# Patient Record
Sex: Female | Born: 1985 | Race: White | Hispanic: No | Smoking: Former smoker
Health system: Southern US, Community
[De-identification: ages and names within clinical notes are randomized; demographics above are authoritative.]

## PROBLEM LIST (undated history)

## (undated) DIAGNOSIS — Z8632 Personal history of gestational diabetes: Secondary | ICD-10-CM

## (undated) DIAGNOSIS — E119 Type 2 diabetes mellitus without complications: Secondary | ICD-10-CM

## (undated) DIAGNOSIS — F32A Depression, unspecified: Secondary | ICD-10-CM

## (undated) DIAGNOSIS — Z973 Presence of spectacles and contact lenses: Secondary | ICD-10-CM

## (undated) DIAGNOSIS — F419 Anxiety disorder, unspecified: Secondary | ICD-10-CM

## (undated) DIAGNOSIS — R208 Other disturbances of skin sensation: Secondary | ICD-10-CM

## (undated) DIAGNOSIS — O24419 Gestational diabetes mellitus in pregnancy, unspecified control: Secondary | ICD-10-CM

## (undated) DIAGNOSIS — Z8759 Personal history of other complications of pregnancy, childbirth and the puerperium: Secondary | ICD-10-CM

## (undated) DIAGNOSIS — G43909 Migraine, unspecified, not intractable, without status migrainosus: Secondary | ICD-10-CM

## (undated) DIAGNOSIS — M549 Dorsalgia, unspecified: Secondary | ICD-10-CM

## (undated) HISTORY — DX: Migraine, unspecified, not intractable, without status migrainosus: G43.909

## (undated) HISTORY — DX: Other disturbances of skin sensation: R20.8

## (undated) HISTORY — DX: Dorsalgia, unspecified: M54.9

## (undated) HISTORY — DX: Anxiety disorder, unspecified: F41.9

## (undated) HISTORY — DX: Gestational diabetes mellitus in pregnancy, unspecified control: O24.419

## (undated) HISTORY — PX: TOOTH EXTRACTION: SUR596

## (undated) HISTORY — DX: Type 2 diabetes mellitus without complications: E11.9

---

## 2020-07-14 ENCOUNTER — Emergency Department (HOSPITAL_COMMUNITY)
Admission: EM | Admit: 2020-07-14 | Discharge: 2020-07-14 | Disposition: A | Discharge status: Home / Self Care | Payer: BC Managed Care – PPO | Attending: Emergency Medicine | Admitting: Emergency Medicine

## 2020-07-14 ENCOUNTER — Encounter (HOSPITAL_COMMUNITY): Payer: Self-pay | Admitting: Obstetrics and Gynecology

## 2020-07-14 ENCOUNTER — Other Ambulatory Visit: Payer: Self-pay

## 2020-07-14 ENCOUNTER — Emergency Department (HOSPITAL_COMMUNITY): Payer: BC Managed Care – PPO

## 2020-07-14 DIAGNOSIS — O418X10 Other specified disorders of amniotic fluid and membranes, first trimester, not applicable or unspecified: Secondary | ICD-10-CM

## 2020-07-14 DIAGNOSIS — O26891 Other specified pregnancy related conditions, first trimester: Secondary | ICD-10-CM | POA: Insufficient documentation

## 2020-07-14 DIAGNOSIS — Z3A01 Less than 8 weeks gestation of pregnancy: Secondary | ICD-10-CM | POA: Diagnosis not present

## 2020-07-14 DIAGNOSIS — N939 Abnormal uterine and vaginal bleeding, unspecified: Secondary | ICD-10-CM | POA: Diagnosis not present

## 2020-07-14 DIAGNOSIS — O4690 Antepartum hemorrhage, unspecified, unspecified trimester: Secondary | ICD-10-CM

## 2020-07-14 LAB — CBC WITH DIFFERENTIAL/PLATELET
Abs Immature Granulocytes: 0.03 10*3/uL (ref 0.00–0.07)
Basophils Absolute: 0 10*3/uL (ref 0.0–0.1)
Basophils Relative: 1 %
Eosinophils Absolute: 0.1 10*3/uL (ref 0.0–0.5)
Eosinophils Relative: 1 %
HCT: 40.6 % (ref 36.0–46.0)
Hemoglobin: 13.3 g/dL (ref 12.0–15.0)
Immature Granulocytes: 0 %
Lymphocytes Relative: 26 %
Lymphs Abs: 2.3 10*3/uL (ref 0.7–4.0)
MCH: 29.3 pg (ref 26.0–34.0)
MCHC: 32.8 g/dL (ref 30.0–36.0)
MCV: 89.4 fL (ref 80.0–100.0)
Monocytes Absolute: 0.6 10*3/uL (ref 0.1–1.0)
Monocytes Relative: 6 %
Neutro Abs: 5.8 10*3/uL (ref 1.7–7.7)
Neutrophils Relative %: 66 %
Platelets: 338 10*3/uL (ref 150–400)
RBC: 4.54 MIL/uL (ref 3.87–5.11)
RDW: 14.2 % (ref 11.5–15.5)
WBC: 8.9 10*3/uL (ref 4.0–10.5)
nRBC: 0 % (ref 0.0–0.2)

## 2020-07-14 LAB — BASIC METABOLIC PANEL
Anion gap: 8 (ref 5–15)
BUN: 8 mg/dL (ref 6–20)
CO2: 24 mmol/L (ref 22–32)
Calcium: 8.8 mg/dL — ABNORMAL LOW (ref 8.9–10.3)
Chloride: 105 mmol/L (ref 98–111)
Creatinine, Ser: 0.65 mg/dL (ref 0.44–1.00)
GFR, Estimated: >60 mL/min (ref >60)
Glucose, Bld: 123 mg/dL — ABNORMAL HIGH (ref 70–99)
Potassium: 3.1 mmol/L — ABNORMAL LOW (ref 3.5–5.1)
Sodium: 137 mmol/L (ref 135–145)

## 2020-07-14 LAB — HCG, QUANTITATIVE, PREGNANCY: hCG, Beta Chain, Quant, S: 50367 m[IU]/mL — ABNORMAL HIGH (ref <5)

## 2020-07-14 LAB — ABO/RH: ABO/RH(D): O POS

## 2020-07-14 MED ORDER — POTASSIUM CHLORIDE CRYS ER 20 MEQ PO TBCR
20.0000 meq | EXTENDED_RELEASE_TABLET | Freq: Once | ORAL | Status: AC
  Administered 2020-07-14: 20 meq via ORAL
  Filled 2020-07-14: qty 1

## 2020-07-14 MED ORDER — PRENATAL COMPLETE 14-0.4 MG PO TABS: ORAL_TABLET | ORAL | 2 refills | Status: DC

## 2020-07-14 NOTE — ED Triage Notes (Addendum)
Patient reports she took 2 home pregnancy tests over the last week that came back positive. Last period week of thanksgiving. Patient reports she is not on a prenatal. Patient reports she woke up this morning to vaginal bleeding.

## 2020-07-14 NOTE — ED Provider Notes (Signed)
[facility]-EMERGENCY DEPT Provider Note   CSN: [id number] Arrival date & time: 07/14/20  1102     History No chief complaint on file.   Katherine Maldonado is a 35 y.o. female.  The history is provided by the patient. No language interpreter was used.  Vaginal Bleeding Quality:  Spotting Severity:  Mild Onset quality:  Gradual Duration:  1 day Timing:  Constant Chronicity:  New Possible pregnancy: yes   Relieved by:  Nothing Worsened by:  Nothing Associated symptoms: no abdominal pain and no nausea   Pt reports 2 home pregnancy test that were positive.  Pt reports bleeding today      History reviewed. No pertinent past medical history.  There are no problems to display for this patient.   History reviewed. No pertinent surgical history.   OB History    Gravida  1   Para      Term      Preterm      AB      Living        SAB      IAB      Ectopic      Multiple      Live Births              No family history on file.  Social History   Tobacco Use  . Smoking status: Never Smoker  . Smokeless tobacco: Never Used  Substance Use Topics  . Alcohol use: Not Currently  . Drug use: Not Currently    Home Medications Prior to Admission medications   Not on File    Allergies    Patient has no allergy information on record.  Review of Systems   Review of Systems  Gastrointestinal: Negative for abdominal pain and nausea.  Genitourinary: Positive for vaginal bleeding.  All other systems reviewed and are negative.   Physical Exam Updated Vital Signs BP 128/77 (BP Location: Right Arm)   Pulse 82   Temp 97.8 F (36.6 C) (Oral)   Resp 17   LMP 06/04/2020 (Approximate)   SpO2 100%   Physical Exam Vitals and nursing note reviewed.  Constitutional:      Appearance: She is well-developed and well-nourished.  HENT:     Head: Normocephalic.  Eyes:     Extraocular Movements: EOM normal.  Cardiovascular:     Rate  and Rhythm: Normal rate.  Pulmonary:     Effort: Pulmonary effort is normal.  Abdominal:     General: Abdomen is flat. There is no distension.  Musculoskeletal:        General: Normal range of motion.     Cervical back: Normal range of motion.  Skin:    General: Skin is warm.  Neurological:     General: No focal deficit present.     Mental Status: She is alert and oriented to person, place, and time.  Psychiatric:        Mood and Affect: Mood and affect and mood normal.     ED Results / Procedures / Treatments   Labs (all labs ordered are listed, but only abnormal results are displayed) Labs Reviewed  HCG, QUANTITATIVE, PREGNANCY - Abnormal; Notable for the following components:      Result Value   hCG, Beta Chain, Quant, S 50,367 (*)    All other components within normal limits  BASIC METABOLIC PANEL - Abnormal; Notable for the following components:   Potassium 3.1 (*)    Glucose, Bld 123 (*)  Calcium 8.8 (*)    All other components within normal limits  CBC WITH DIFFERENTIAL/PLATELET  ABO/RH    EKG None  Radiology No results found.  Procedures Procedures (including critical care time)  Medications Ordered in ED Medications - No data to display  ED Course  I have reviewed the triage vital signs and the nursing notes.  Pertinent labs & imaging results that were available during my care of the patient were reviewed by me and considered in my medical decision making (see chart for details). Clinical Course as of 07/14/20 1748  Sat Jul 14, 2020  1735 US OB Comp Less 14 Wks [LS]    Clinical Course User Index [LS] Osie Cheeks   MDM Rules/Calculators/A&P                          MDM: 0 positive, HCG [phone] , Ultrasound ordered. Pt's care turned over to [facility] ultrasound pending Final Clinical Impression(s) / ED Diagnoses Final diagnoses:  Vaginal bleeding during pregnancy    Rx / DC Orders ED Discharge Orders    None        Osie Cheeks 07/14/20 1752    Milagros Loll, MD 07/16/20 2328

## 2020-07-14 NOTE — L&D Delivery Note (Signed)
Delivery Note After about an hour of pushing, at 11:02 PM a viable female was delivered via Vaginal, Spontaneous (Presentation:   Occiput Anterior).  APGAR: 9, 10; weight 7 lb 8.6 oz (3419 g).  After 1 minute, the cord was clamped and cut. 40 units of pitocin diluted in 1000cc LR was infused rapidly IV.  The placenta separated spontaneously and delivered via CCT and maternal pushing effort.  It was inspected and appears to be intact with a 3 VC.  She had continued oozing of blood from her uterus, so cytotec given PR and TXA 1gm IV.  Bleeding resolved. She had several severe range BPs during the first pp hour, so Dr. Charlotta Newton consulted and Mg started.  Anesthesia: Epidural Episiotomy: None Lacerations: None Suture Repair:  Est. Blood Loss (mL): 600  Mom to postpartum.  Baby to Couplet care / Skin to Skin.  Katherine Maldonado 02/15/2021, 12:43 AM

## 2020-07-14 NOTE — ED Provider Notes (Signed)
Care transferred from [place], [place] at shift change.  Her for vaginal bleeding and home positive preg test.  Preg here positive Opos- doe snot need Rhogam Plan on follow up on imaging and determine disposition Physical Exam  BP 129/70   Pulse 77   Temp 97.8 F (36.6 C) (Oral)   Resp 16   LMP 06/04/2020 (Approximate)   SpO2 98%   Physical Exam Vitals and nursing note reviewed.  Constitutional:      General: She is not in acute distress.    Appearance: She is well-developed and well-nourished. She is not diaphoretic.  HENT:     Head: Atraumatic.  Eyes:     Pupils: Pupils are equal, round, and reactive to light.  Cardiovascular:     Rate and Rhythm: Normal rate and regular rhythm.  Pulmonary:     Effort: Pulmonary effort is normal. No respiratory distress.  Abdominal:     General: There is no distension.     Palpations: Abdomen is soft.  Musculoskeletal:        General: Normal range of motion.     Cervical back: Normal range of motion and neck supple.  Skin:    General: Skin is warm and dry.  Neurological:     Mental Status: She is alert.  Psychiatric:        Mood and Affect: Mood and affect normal.     ED Course/Procedures   Clinical Course as of 07/14/20 1912  Sat Jul 14, 2020  1735 US OB Comp Less 14 Wks [LS]    Clinical Course User Index [LS] Elson [place], [place]    Procedures   Labs Reviewed  HCG, QUANTITATIVE, PREGNANCY - Abnormal; Notable for the following components:      Result Value   hCG, Beta Chain, Quant, S 50,367 (*)    All other components within normal limits  BASIC METABOLIC PANEL - Abnormal; Notable for the following components:   Potassium 3.1 (*)    Glucose, Bld 123 (*)    Calcium 8.8 (*)    All other components within normal limits  CBC WITH DIFFERENTIAL/PLATELET  ABO/RH  US OB LESS THAN 14 WEEKS WITH OB TRANSVAGINAL  Result Date: 07/14/2020 CLINICAL DATA:  Vaginal spotting pregnant patient EXAM: OBSTETRIC <14 WK [place] AND  TRANSVAGINAL OB US TECHNIQUE: Both transabdominal and transvaginal ultrasound examinations were performed for complete evaluation of the gestation as well as the maternal uterus, adnexal regions, and pelvic cul-de-sac. Transvaginal technique was performed to assess early pregnancy. COMPARISON:  None. FINDINGS: Intrauterine gestational sac: Single intrauterine gestation Yolk sac:  Visualized. Embryo:  Visualized. Cardiac Activity: Visualized. Heart Rate: 120 bpm CRL: 6 mm   6 w   3 d                  [place] EDC: 03/06/2021 Subchorionic hemorrhage: Small subchorionic hemorrhage along the right aspect of the sac. Maternal uterus/adnexae: Small hypoechoic uterine masses, possible fibroids, this measures 7 mm within the anterior uterine corpus and 1 cm within the subserosal posterior lower uterine segment. Ovaries are within normal limits. Right ovary measures 3.8 x 1.8 x 2.1 cm and contains probable corpus luteum. The left ovary measures 2.3 x 0.7 x 1.7 cm. No significant free fluid. IMPRESSION: 1. Single viable intrauterine pregnancy as above. 2. Small subchorionic hemorrhage 3. Probable small uterine fibroids Electronically Signed   By: Jasmine Pang M.D.   On: 07/14/2020 18:38   MDM  Preg, vag bleeding>> Plan to follow up on  [place]  Ultrasound with small subchorionic hemorrhage.  Single IUP at 6 weeks 3 days.  Discussed results with patient.  Pelvic rest.  Strict return precautions.  Prescription written for prenatal vitamins.  She will follow-up with OB or return for new worsening symptoms per  The patient has been appropriately medically screened and/or stabilized in the ED. I have low suspicion for any other emergent medical condition which would require further screening, evaluation or treatment in the ED or require inpatient management.  Patient is hemodynamically stable and in no acute distress.  Patient able to ambulate in department prior to ED.  Evaluation does not show acute pathology that would require  ongoing or additional emergent interventions while in the emergency department or further inpatient treatment.  I have discussed the diagnosis with the patient and answered all questions.  Pain is been managed while in the emergency department and patient has no further complaints prior to discharge.  Patient is comfortable with plan discussed in room and is stable for discharge at this time.  I have discussed strict return precautions for returning to the emergency department.  Patient was encouraged to follow-up with PCP/specialist refer to at discharge.     Linwood Dibbles, PA-C 07/14/20 1912    Milagros Loll, MD 07/16/20 2326

## 2020-07-14 NOTE — ED Notes (Signed)
Patient transported to Ultrasound 

## 2020-07-14 NOTE — Discharge Instructions (Addendum)
Follow up with Obgyn  If you start bleeding through a pad having to change in less than 2 hours please seek reevaluation.  There is a women's center at the [facility] emergency department, this is not located at the main emergency department entrance however is located at the women and children's entrance.  If you have any new or worsening symptoms I suggest following up there for evaluation

## 2020-07-22 ENCOUNTER — Emergency Department (HOSPITAL_COMMUNITY)
Admission: EM | Admit: 2020-07-22 | Discharge: 2020-07-22 | Disposition: A | Discharge status: Home / Self Care | Payer: BC Managed Care – PPO | Attending: Emergency Medicine | Admitting: Emergency Medicine

## 2020-07-22 ENCOUNTER — Other Ambulatory Visit: Payer: Self-pay

## 2020-07-22 ENCOUNTER — Encounter (HOSPITAL_COMMUNITY): Payer: Self-pay

## 2020-07-22 DIAGNOSIS — O26851 Spotting complicating pregnancy, first trimester: Secondary | ICD-10-CM | POA: Insufficient documentation

## 2020-07-22 DIAGNOSIS — Z5321 Procedure and treatment not carried out due to patient leaving prior to being seen by health care provider: Secondary | ICD-10-CM | POA: Diagnosis not present

## 2020-07-22 DIAGNOSIS — Z3A01 Less than 8 weeks gestation of pregnancy: Secondary | ICD-10-CM | POA: Diagnosis not present

## 2020-07-22 NOTE — ED Notes (Signed)
Pt called 3x for room placement. Eloped from waiting area.  

## 2020-07-22 NOTE — ED Triage Notes (Signed)
Patient was seen on 07/15/19 for vaginal bleeding and pregnancy. Patient states she has not followed up with Ob/GYN because of the cost.

## 2020-07-25 ENCOUNTER — Inpatient Hospital Stay (HOSPITAL_COMMUNITY)
Admission: EM | Admit: 2020-07-25 | Discharge: 2020-07-25 | Disposition: A | Discharge status: Home / Self Care | Payer: Medicaid Other | Attending: Obstetrics and Gynecology | Admitting: Obstetrics and Gynecology

## 2020-07-25 ENCOUNTER — Encounter (HOSPITAL_COMMUNITY): Payer: Self-pay

## 2020-07-25 ENCOUNTER — Other Ambulatory Visit: Payer: Self-pay

## 2020-07-25 ENCOUNTER — Inpatient Hospital Stay (HOSPITAL_COMMUNITY): Payer: Medicaid Other

## 2020-07-25 DIAGNOSIS — Z3A01 Less than 8 weeks gestation of pregnancy: Secondary | ICD-10-CM | POA: Diagnosis not present

## 2020-07-25 DIAGNOSIS — O4691 Antepartum hemorrhage, unspecified, first trimester: Secondary | ICD-10-CM

## 2020-07-25 DIAGNOSIS — Z3A08 8 weeks gestation of pregnancy: Secondary | ICD-10-CM | POA: Insufficient documentation

## 2020-07-25 DIAGNOSIS — Z91040 Latex allergy status: Secondary | ICD-10-CM | POA: Diagnosis not present

## 2020-07-25 DIAGNOSIS — O208 Other hemorrhage in early pregnancy: Secondary | ICD-10-CM | POA: Diagnosis not present

## 2020-07-25 DIAGNOSIS — O209 Hemorrhage in early pregnancy, unspecified: Secondary | ICD-10-CM | POA: Diagnosis present

## 2020-07-25 LAB — URINALYSIS, ROUTINE W REFLEX MICROSCOPIC
Bilirubin Urine: NEGATIVE
Glucose, UA: NEGATIVE mg/dL
Ketones, ur: NEGATIVE mg/dL
Leukocytes,Ua: NEGATIVE
Nitrite: NEGATIVE
Protein, ur: NEGATIVE mg/dL
Specific Gravity, Urine: 1.01 (ref 1.005–1.030)
pH: 7 (ref 5.0–8.0)

## 2020-07-25 LAB — WET PREP, GENITAL
Sperm: NONE SEEN
Trich, Wet Prep: NONE SEEN
Yeast Wet Prep HPF POC: NONE SEEN

## 2020-07-25 NOTE — MAU Note (Signed)
.  Katherine Maldonado is a 35 y.o. at [redacted]w[redacted]d here in MAU reporting: Vaginal bleeding since 0030 this morning. Patient states she could not find her sanitary napkins so has not worn one. Denies abdominal pain.   Last intercourse: few days after christmas Pain score: 0/10 Vitals:   07/25/20 0129  BP: 126/74  Pulse: 83  Resp: 16  Temp: 98.4 F (36.9 C)  SpO2: 100%   Lab orders placed from triage: UA

## 2020-07-25 NOTE — Discharge Instructions (Signed)
Vaginal Bleeding During Pregnancy, First Trimester A small amount of bleeding from the vagina is common during early pregnancy. This kind of bleeding is also called spotting. Sometimes the bleeding is normal and does not cause problems. At other times, though, bleeding may be a sign of something serious. Normal bleeding in pregnancy can happen:  When the fertilized egg attaches itself to your womb.  When blood vessels change because of the pregnancy.  When you have pelvic exams.  When you have sex. Abnormal bleeding can happen:  When you have an infection.  When you have growths in your womb. The growths are called polyps.  If you are having a miscarriage or at risk of having one.  If you have other problems in your pregnancy. Tell your doctor right away about any bleeding from your vagina. Follow these instructions at home: Watch your bleeding  Watch your condition for any changes. Let your doctor know if you are worried about something.  Try to know what causes your bleeding. Ask yourself these questions: ? Does the bleeding start on its own? ? Does the bleeding start after something is done, such as sex or a pelvic exam?  Use a diary to write the things you see about your bleeding. Write in your diary: ? If the bleeding flows freely without stopping, or if it starts and stops, and then starts again. ? If the bleeding is heavy or light. ? How many pads you use in a day and how much blood is in them.  Tell your doctor if you pass tissue. He or she may want to see it.   Activity  Follow your doctor's instructions about how active you can be. Ask what activities are safe for you.  Do not have sex or orgasms until your doctor says that this is safe.  If needed, make plans for someone to help with your normal activities. General instructions  Take over-the-counter and prescription medicines only as told by your doctor.  Do not take aspirin because it can cause  bleeding.  Do not use tampons.  Do not douche.  Keep all follow-up visits. Contact a doctor if:  You have vaginal bleeding at any time while you are pregnant.  You have cramps.  You have a fever or chills. Get help right away if:  You have very bad cramps in your back or belly (abdomen).  You pass large clots or a lot of tissue from your vagina.  Your bleeding gets worse.  You feel light-headed.  You feel weak.  You pass out (faint).  You have chills.  You are leaking fluid from your vagina.  You have a gush of fluid from your vagina. Summary  Sometimes vaginal bleeding during pregnancy is normal and does not cause problems. At other times, bleeding may be a sign of something serious.  Tell your doctor right away about any bleeding from your vagina.  Follow your doctor's instructions about how active you can be. You may need someone to help you with your normal activities.  Keep all follow-up visits. This information is not intended to replace advice given to you by your health care provider. Make sure you discuss any questions you have with your health care provider. Document Revised: 03/22/2020 Document Reviewed: 03/22/2020 Elsevier Patient Education  2021 Elsevier [employer]. [place] [facility] Providers    [facility] for [employer] at [facility]       Phone: [phone]  Center for [employer] [facility] at [place]   Phone:  [phone]  Center for [facility] at [place]  Phone: [phone]  Center for [facility] at [facility]  Phone: [phone]  Center for [facility] at [place]  Phone: [phone]  Center for [facility] at [facility]   Phone: [phone]  [facility]       Phone: [phone]  [facility]    Phone: [phone]   [facility]    Phone: [phone]  [facility]    Phone: [phone]  [facility]    Phone: [phone]  [facility]       Phone: [phone]   [facility] Department-Maternity  Phone: [phone]  [facility]    Phone: [phone]  [facility] [place]   Phone: [phone]  [facility]      Phone: [phone]  [facility]    Phone: [phone]

## 2020-07-25 NOTE — MAU Provider Note (Signed)
Chief Complaint: Vaginal Bleeding   Event Date/Time   First Provider Initiated Contact with Patient 07/25/20 0207        SUBJECTIVE HPI: Katherine Maldonado is a 35 y.o. G1P0 at Unknown by LMP who presents to maternity admissions reporting vaginal bleeding which became heavy tonight.  No pain.  Has known subchorionic hemorrhage seen on [place] on 07/14/20. She denies vaginal itching/burning, urinary symptoms, h/a, dizziness, n/v, or fever/chills.    Vaginal Bleeding The patient's primary symptoms include vaginal bleeding. The patient's pertinent negatives include no genital itching, genital lesions, genital odor or pelvic pain. This is a recurrent problem. The current episode started today. The patient is experiencing no pain. She is pregnant. Pertinent negatives include no abdominal pain, back pain, chills, constipation, diarrhea or fever. The vaginal discharge was bloody. She has been passing clots. She has not been passing tissue. Nothing aggravates the symptoms. She has tried nothing for the symptoms.   RN Note: Katherine Maldonado is a 35 y.o. at [redacted]w[redacted]d here in MAU reporting: Vaginal bleeding since 0030 this morning. Patient states she could not find her sanitary napkins so has not worn one. Denies abdominal pain.   Last intercourse: few days after christmas Pain score: 0/10  History reviewed. No pertinent past medical history. History reviewed. No pertinent surgical history. Social History   Socioeconomic History  . Marital status: Divorced    Spouse name: Not on file  . Number of children: Not on file  . Years of education: Not on file  . Highest education level: Not on file  Occupational History  . Not on file  Tobacco Use  . Smoking status: Never Smoker  . Smokeless tobacco: Never Used  Vaping Use  . Vaping Use: Never used  Substance and Sexual Activity  . Alcohol use: Not Currently  . Drug use: Not Currently  . Sexual activity: Yes    Birth control/protection: None  Other  Topics Concern  . Not on file  Social History Narrative  . Not on file   Social Determinants of Health   Financial Resource Strain: Not on file  Food Insecurity: Not on file  Transportation Needs: Not on file  Physical Activity: Not on file  Stress: Not on file  Social Connections: Not on file  Intimate Partner Violence: Not on file   No current facility-administered medications on file prior to encounter.   Current Outpatient Medications on File Prior to Encounter  Medication Sig Dispense Refill  . EQL EVENING PRIMROSE OIL PO Take 3 capsules by mouth at bedtime.    . Prenatal Vit-Fe Fumarate-FA (PRENATAL COMPLETE) 14-0.4 MG TABS Take 1 tablet daily 60 tablet 2   Allergies  Allergen Reactions  . Penicillins     Did it involve swelling of the face/tongue/throat, SOB, or low BP? Unknown Did it involve sudden or severe rash/hives, skin peeling, or any reaction on the inside of your mouth or nose? Unknown Did you need to seek medical attention at a hospital or doctor's office? Unknown When did it last happen?childhood If all above answers are "NO", may proceed with cephalosporin use.    . Latex Swelling and Rash    I have reviewed patient's Past Medical Hx, Surgical Hx, Family Hx, Social Hx, medications and allergies.   ROS:  Review of Systems  Constitutional: Negative for chills and fever.  Gastrointestinal: Negative for abdominal pain, constipation and diarrhea.  Genitourinary: Positive for vaginal bleeding. Negative for pelvic pain.  Musculoskeletal: Negative for back pain.  Review of Systems  Other systems negative   Physical Exam  Physical Exam Patient Vitals for the past 24 hrs:  BP Temp Temp src Pulse Resp SpO2  07/25/20 0129 126/74 98.4 F (36.9 C) Oral 83 16 100 %   Constitutional: Well-developed, well-nourished female in no acute distress.  Cardiovascular: normal rate Respiratory: normal effort GI: Abd soft, non-tender.  MS: Extremities  nontender, no edema, normal ROM Neurologic: Alert and oriented x 4.  GU: Neg CVAT.  PELVIC EXAM: Cervix pink, visually closed, without lesion, moderate red blood in vault.  LAB RESULTS --/--/O POS (01/01 1458)  IMAGING US OB Transvaginal  Result Date: 07/25/2020 CLINICAL DATA:  Vaginal bleeding EXAM: TRANSVAGINAL OB ULTRASOUND TECHNIQUE: Transvaginal ultrasound was performed for complete evaluation of the gestation as well as the maternal uterus, adnexal regions, and pelvic cul-de-sac. COMPARISON:  07/14/2020 FINDINGS: Intrauterine gestational sac: Single Yolk sac:  Visualized. Embryo:  Visualized. Cardiac Activity: Visualized. Heart Rate: 154 bpm MSD:   mm    w     d CRL:   16.4 mm   8 w 0 d                  [place] EDC: 03/06/2021 Subchorionic hemorrhage:  None visualized. Maternal uterus/adnexae: No adnexal mass or free fluid IMPRESSION: Eight week intrauterine pregnancy. Fetal heart rate 154 beats per minute. No acute maternal findings. Previously seen subchorionic hemorrhage no longer visualized. Electronically Signed   By: Charlett Nose M.D.   On: 07/25/2020 03:08    MAU Management/MDM: Ordered Ultrasound to rule out worsening [facility] or [facility] resolved  This bleeding/pain can represent a normal pregnancy with bleeding, spontaneous abortion or even an ectopic which can be life-threatening.  The process as listed above helps to determine which of these is present.  List of prenatal providers given  ASSESSMENT Single intrauterine pregnancy at [redacted]w[redacted]d Bleeding in pregnancy, subchorionic hemorrhage resolved  PLAN Discharge home List of OB providers given  Pt stable at time of discharge. Encouraged to return here if she develops worsening of symptoms, increase in pain, fever, or other concerning symptoms.    Wynelle Bourgeois CNM, MSN Certified Nurse-Midwife 07/25/2020  2:07 AM

## 2020-07-25 NOTE — ED Triage Notes (Signed)
Pt reports that she is [redacted] weeks pregnant, first pregnancy, began bleeding today.

## 2020-07-25 NOTE — ED Triage Notes (Signed)
Emergency Medicine Provider OB Triage Evaluation Note  Katherine Maldonado is a 35 y.o. female, G1P0, at Unknown gestation who presents to the emergency department with complaints of vaginal bleeding.  G1P0 approx [redacted] weeks gestation by [place] a few days at [facility].  Bleeding stopped, started again but now seems heavier.  Review of  Systems  Positive: vaginal bleeding Negative: cramping, abdominal pain, vomiting  Physical Exam  BP 126/74 (BP Location: Right Arm)   Pulse 83   Temp 98.4 F (36.9 C) (Oral)   Resp 16   LMP 06/04/2020 (Approximate)   SpO2 100%  General: Awake, no distress  HEENT: Atraumatic  Resp: Normal effort  Cardiac: Normal rate Abd: Nondistended, nontender  MSK: Moves all extremities without difficulty Neuro: Speech clear  Medical Decision Making  Pt evaluated for pregnancy concern and is stable for transfer to MAU. Pt is in agreement with plan for transfer.  1:48 AM Discussed with MAU APP, Katherine Maldonado, who accepts patient in transfer.  Clinical Impression   Vaginal bleeding in pregnancy.    Katherine Hatchet, PA-C 07/25/20 0149

## 2020-07-26 LAB — GC/CHLAMYDIA PROBE AMP (~~LOC~~) NOT AT ARMC
Chlamydia: NEGATIVE
Comment: NEGATIVE
Comment: NORMAL
Neisseria Gonorrhea: NEGATIVE

## 2020-07-27 ENCOUNTER — Telehealth: Payer: Self-pay

## 2020-07-27 NOTE — Telephone Encounter (Addendum)
Pt called stating that she has severe toothache and her NEW OB appt is not until 08/14/20.  Left message for pt that I am returning her call if she could go to her nearest Urgent Care for assistance.  If she has any further questions or concerns our office will reopen on Tuesday.    Addison Naegeli, RN 07/27/20

## 2020-08-07 ENCOUNTER — Other Ambulatory Visit: Payer: Self-pay

## 2020-08-07 ENCOUNTER — Telehealth (INDEPENDENT_AMBULATORY_CARE_PROVIDER_SITE_OTHER): Payer: BC Managed Care – PPO | Admitting: *Deleted

## 2020-08-07 DIAGNOSIS — O0990 Supervision of high risk pregnancy, unspecified, unspecified trimester: Secondary | ICD-10-CM

## 2020-08-07 DIAGNOSIS — R208 Other disturbances of skin sensation: Secondary | ICD-10-CM

## 2020-08-07 DIAGNOSIS — Z3A00 Weeks of gestation of pregnancy not specified: Secondary | ICD-10-CM

## 2020-08-07 DIAGNOSIS — O09519 Supervision of elderly primigravida, unspecified trimester: Secondary | ICD-10-CM

## 2020-08-07 DIAGNOSIS — Z3401 Encounter for supervision of normal first pregnancy, first trimester: Secondary | ICD-10-CM

## 2020-08-07 HISTORY — DX: Supervision of elderly primigravida, unspecified trimester: O09.519

## 2020-08-07 MED ORDER — BLOOD PRESSURE KIT DEVI: 1.0000 | 0 refills | Status: DC | PRN

## 2020-08-07 NOTE — Addendum Note (Signed)
Addended by: Gerome Apley on: 08/07/2020 04:43 PM   Modules accepted: Orders

## 2020-08-07 NOTE — Patient Instructions (Addendum)
- At our [facility], we work as an integrated team, providing care to address both physical and emotional health. Your medical provider may refer you to see our Behavioral Health Clinician (BHC) on the same day you see your medical provider, as availability permits; often scheduled virtually at your convenience.  Our BHC is available to all patients, visits generally last between 20-30 minutes, but can be longer or shorter, depending on patient need. The BHC offers help with stress management, coping with symptoms of depression and anxiety, major life changes , sleep issues, changing risky behavior, grief and loss, life stress, working on personal life goals, and  behavioral health issues, as these all affect your overall health and wellness.  The BHC is NOT available for the following: FMLA paperwork, court-ordered evaluations, specialty assessments (custody or disability), letters to employers, or obtaining certification for an emotional support animal. The BHC does not provide long-term therapy. You have the right to refuse integrated behavioral health services, or to reschedule to see the BHC at a later date.  Confidentiality exception: If it is suspected that a child or disabled adult is being abused or neglected, we are required by law to report that to either Child Protective Services or Adult Protective Services.  If you have a diagnosis of Bipolar affective disorder, Schizophrenia, or recurrent Major depressive disorder, we will recommend that you establish care with a psychiatrist, as these are lifelong, chronic conditions, and we want your overall emotional health and medications to be more closely monitored. If you anticipate needing extended maternity leave due to mental health issues postpartum, it it recommended you inform your medical provider, so we can put in a referral to a  psychiatrist as soon as possible. The BHC is unable to recommend an extended maternity leave for mental  health issues. Your medical provider or BHC may refer you to a therapist for ongoing, traditional therapy, or to a psychiatrist, for medication management, if it would benefit your overall health. Depending on your insurance, you may have a copay to see the BHC. If you are uninsured, it is recommended that you apply for financial assistance. (Forms may be requested at the front desk for in-person visits, via MyChart, or request a form during a virtual visit).  If you see the BHC more than 6 times, you will have to complete a comprehensive clinical assessment interview with the BHC to resume integrated services.  For virtual visits with the BHC, you must be physically in the state of [place] at the time of the visit. For example, if you live in [place], you will have to do an in-person visit with the BHC, and your out-of-state insurance may not cover behavioral health services in [place]. f you are going out of the state or country for any reason, the BHC may see you virtually when you return to [place], but not while you are physically outside of [place].     Meet the Provider Zoom Sessions      [facility] is now offering FREE monthly 1-hour virtual Zoom sessions for new, current, and prospective patients.        During these sessions, you can:   Learn about our practice, model of care, services   Get answers to questions about pregnancy and birth during COVID   Pick your provider's brain about anything else!    Sessions will be hosted by [facility] Nurse Practitioners, Physician Assistants, Physicians and Midwives            No registration required      2021 Dates:      All at 6pm     October 21st     November 18th   December 16th     January 20th  February 17th    To join one of these meetings, a few minutes before it is set to start:     Copy/paste the link into your web  browser:  [email or web address]    OR  Scan the QR code below (open up your camera and point towards QR code; click on tab that pops up on your phone ("zoom")    

## 2020-08-07 NOTE — Progress Notes (Addendum)
New OB Intake  I connected with  [name] on 08/07/20 at  3:15 PM EST by MyChart and verified that I am speaking with the correct person using two identifiers. Nurse is located at [facility] and pt is located at home. Addendum: visit was completed virtually through MyChart.  Legrand Como  I discussed the limitations, risks, security and privacy concerns of performing an evaluation and management service by telephone and the availability of in person appointments. I also discussed with the patient that there may be a patient responsible charge related to this service. The patient expressed understanding and agreed to proceed.  I explained I am completing New OB Intake today. We discussed her EDD of 03/06/21 that is based on [place]. Pt is G1/P0. I reviewed her allergies, medications, Medical/Surgical/OB history, and appropriate screenings. I informed her of [facility] services. Based on history, this is a/an complicated by AMA pregnancy.  Concerns addressed today  Delivery Plans:  Plans to deliver at [facility].   MyChart/Babyscripts MyChart access verified. I explained pt will have some visits in office and some virtually. Babyscripts instructions given.   Blood Pressure Cuff Blood pressure cuff ordered for patient to pick-up from [employer]. Explained after first prenatal appt pt will check weekly and document in Babyscripts.  Anatomy US Explained anatomy US will be around 19 weeks and she will be notified by MyChart.   Labs Discussed Avelina Laine genetic screening with patient. Would like both Panorama and Horizon drawn at new OB visit. Routine prenatal labs needed.  Covid Vaccine Patient has covid vaccine.   [facility] Referral Patient is interested in referral to [facility].    First visit review I reviewed new OB appt with pt. I explained she will have a pelvic exam, ob bloodwork with genetic screening, and PAP smear. Explained pt will be seen by Dr. Myriam Jacobson at first visit; encounter routed to  appropriate provider.  Zoom meeting for new patient offered .  Rajvir Ernster,RN 08/07/2020  3:19 PM

## 2020-08-14 ENCOUNTER — Other Ambulatory Visit: Payer: Self-pay

## 2020-08-14 ENCOUNTER — Telehealth: Payer: Self-pay | Admitting: Family Medicine

## 2020-08-14 ENCOUNTER — Encounter: Payer: BC Managed Care – PPO | Admitting: Obstetrics and Gynecology

## 2020-08-14 ENCOUNTER — Ambulatory Visit (INDEPENDENT_AMBULATORY_CARE_PROVIDER_SITE_OTHER): Payer: BC Managed Care – PPO | Admitting: Family Medicine

## 2020-08-14 VITALS — BP 136/88 | HR 94 | Wt 212.4 lb

## 2020-08-14 DIAGNOSIS — O0990 Supervision of high risk pregnancy, unspecified, unspecified trimester: Secondary | ICD-10-CM

## 2020-08-14 DIAGNOSIS — R208 Other disturbances of skin sensation: Secondary | ICD-10-CM | POA: Diagnosis not present

## 2020-08-14 DIAGNOSIS — G43809 Other migraine, not intractable, without status migrainosus: Secondary | ICD-10-CM

## 2020-08-14 NOTE — Telephone Encounter (Signed)
Called patient to reschedule her appointment for 02/03. Patient was very upset because of her appointment being rescheduled. She stated she works from home, and can not miss this appointment.

## 2020-08-14 NOTE — Progress Notes (Signed)
Subjective:   Katherine Maldonado is a 35 y.o. G1P0 at 26w6dby LMP being seen today for her first obstetrical visit.  Her obstetrical history is significant for advanced maternal age and obesity. Patient does intend to breast feed. Pregnancy history fully reviewed.  Patient reports hx of migraines for many years. Estimates they happen around 3-4x a month at the most. Usually takes 16039mof ibuprofen morning and night which only helps a little. Has never seen a neurologist.   Also reports long standing numbness, burning, and tingling in the lateral L thigh ever since she injured her tail bone many years ago. Has had sciatica in the past, this feels different to her.    HISTORY: OB History  Gravida Para Term Preterm AB Living  1 0 0 0 0 0  SAB IAB Ectopic Multiple Live Births  0 0 0 0 0    # Outcome Date GA Lbr Len/2nd Weight Sex Delivery Anes PTL Lv  1 Current            Last pap smear was: she is unsure of the date, possibly more than 10 years ago  Past Medical History:  Diagnosis Date  . Anxiety   . Back pain   . Burning sensation of lower extremity    left thigh  . Migraines    Past Surgical History:  Procedure Laterality Date  . TOOTH EXTRACTION     Family History  Adopted: Yes  Problem Relation Age of Onset  . Thyroid disease Mother   . COPD Mother   . Diabetes Father   . Hypertension Father   . Thrombocytopenia Father    Social History   Tobacco Use  . Smoking status: Former Smoker    Types: Cigarettes  . Smokeless tobacco: Never Used  . Tobacco comment: when teenager  Vaping Use  . Vaping Use: Never used  Substance Use Topics  . Alcohol use: Not Currently    Comment: occasionally  . Drug use: Not Currently    Types: Marijuana    Comment: last used October 2021   Allergies  Allergen Reactions  . Latex Swelling and Rash  . Penicillins Rash and Other (See Comments)    Did it involve swelling of the face/tongue/throat, SOB, or low BP?  Unknown Did it involve sudden or severe rash/hives, skin peeling, or any reaction on the inside of your mouth or nose? Unknown Did you need to seek medical attention at a hospital or doctor's office? Unknown When did it last happen?childhood If all above answers are "NO", may proceed with cephalosporin use.   Childhood reaction    Current Outpatient Medications on File Prior to Visit  Medication Sig Dispense Refill  . Prenatal Vit-Fe Fumarate-FA (PRENATAL COMPLETE) 14-0.4 MG TABS Take 1 tablet daily 60 tablet 2  . Blood Pressure Monitoring (BLOOD PRESSURE KIT) DEVI 1 Device by Does not apply route as needed. (Patient not taking: Reported on 08/14/2020) 1 each 0   No current facility-administered medications on file prior to visit.     Exam   Vitals:   08/14/20 1509  BP: 136/88  Pulse: 94  Weight: 212 lb 6.4 oz (96.3 kg)   Fetal Heart Rate (bpm): 173  Uterus:     Pelvic Exam: Perineum: no hemorrhoids, normal perineum   Vulva: normal external genitalia, no lesions   Vagina:  normal mucosa, normal discharge   Cervix: no lesions and normal, pap smear done.    Adnexa: normal adnexa and  no mass, fullness, tenderness   Bony Pelvis: average  System: General: well-developed, well-nourished female in no acute distress   Breast:  normal appearance, no masses or tenderness   Skin: normal coloration and turgor, no rashes   Neurologic: oriented, normal, negative, normal mood   Extremities: normal strength, tone, and muscle mass, ROM of all joints is normal   HEENT PERRLA, extraocular movement intact and sclera clear, anicteric   Mouth/Teeth mucous membranes moist, pharynx normal without lesions and dental hygiene good   Neck supple and no masses   Cardiovascular: regular rate and rhythm   Respiratory:  no respiratory distress, normal breath sounds   Abdomen: soft, non-tender; bowel sounds normal; no masses,  no organomegaly     Assessment:   Pregnancy: G1P0 Patient Active  Problem List   Diagnosis Date Noted  . Supervision of high risk pregnancy, antepartum 08/07/2020  . AMA (advanced maternal age) primigravida 35+ 08/07/2020  . Burning sensation of lower extremity      Plan:  1. Supervision of high risk pregnancy, antepartum Initial labs drawn. Long hx without pap smear, reinforced importance of obtaining, she would like to do so at the next visit Continue prenatal vitamins. Genetic Screening discussed, NIPS: ordered. Ultrasound discussed; fetal anatomic survey: ordered Problem list reviewed and updated. The nature of [place] with multiple MDs and other Advanced Practice Providers was explained to patient; also emphasized that residents, students are part of our team. - Culture, OB Urine - GC/Chlamydia probe amp ([place])not at [place] - CBC/D/Plt+RPR+Rh+ABO+Rub Ab... - Hemoglobin A1c  2. 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 Other migraine without status migrainosus, not intractable Very debilitating when they occur, has stopped taking ibuprofen. Also reviewed prior dosing was unsafe Reviewed safe tylenol doses Referral to neurology - Ambulatory referral to Neurology   Routine obstetric precautions reviewed. Return in 4 weeks (on 09/11/2020) for ROB.

## 2020-08-14 NOTE — Progress Notes (Unsigned)
Bedside US performed by Dr Crissie Reese since unable to hear audible FHR with doppler

## 2020-08-14 NOTE — Patient Instructions (Signed)
 Obstetrics: Normal and Problem Pregnancies (7th ed., pp. 102-121). [place], [place]: [employer]."> Textbook of Family Medicine (9th ed., pp. 365-410). [place], [place]: [employer].">  First Trimester of Pregnancy  The first trimester of pregnancy starts on the first day of your last menstrual period until the end of week 12. This is months 1 through 3 of pregnancy. A week after a sperm fertilizes an egg, the egg will implant into the wall of the uterus and begin to develop into a baby. By the end of 12 weeks, all the baby's organs will be formed and the baby will be 2-3 inches in size. Body changes during your first trimester Your body goes through many changes during pregnancy. The changes vary and generally return to normal after your baby is born. Physical changes  You may gain or lose weight.  Your breasts may begin to grow larger and become tender. The tissue that surrounds your nipples (areola) may become darker.  Dark spots or blotches (chloasma or mask of pregnancy) may develop on your face.  You may have changes in your hair. These can include thickening or thinning of your hair or changes in texture. Health changes  You may feel nauseous, and you may vomit.  You may have heartburn.  You may develop headaches.  You may develop constipation.  Your gums may bleed and may be sensitive to brushing and flossing. Other changes  You may tire easily.  You may urinate more often.  Your menstrual periods will stop.  You may have a loss of appetite.  You may develop cravings for certain kinds of food.  You may have changes in your emotions from day to day.  You may have more vivid and strange dreams. Follow these instructions at home: Medicines  Follow your health care provider's instructions regarding medicine use. Specific medicines may be either safe or unsafe to take during pregnancy. Do not take any medicines unless told to by your health care provider.  Take  a prenatal vitamin that contains at least 600 micrograms (mcg) of folic acid. Eating and drinking  Eat a healthy diet that includes fresh fruits and vegetables, whole grains, good sources of protein such as meat, eggs, or tofu, and low-fat dairy products.  Avoid raw meat and unpasteurized juice, milk, and cheese. These carry germs that can harm you and your baby.  If you feel nauseous or you vomit: ? Eat 4 or 5 small meals a day instead of 3 large meals. ? Try eating a few soda crackers. ? Drink liquids between meals instead of during meals.  You may need to take these actions to prevent or treat constipation: ? Drink enough fluid to keep your urine pale yellow. ? Eat foods that are high in fiber, such as beans, whole grains, and fresh fruits and vegetables. ? Limit foods that are high in fat and processed sugars, such as fried or sweet foods. Activity  Exercise only as directed by your health care provider. Most people can continue their usual exercise routine during pregnancy. Try to exercise for 30 minutes at least 5 days a week.  Stop exercising if you develop pain or cramping in the lower abdomen or lower back.  Avoid exercising if it is very hot or humid or if you are at high altitude.  Avoid heavy lifting.  If you choose to, you may have sex unless your health care provider tells you not to. Relieving pain and discomfort  Wear a good support bra to relieve   breast tenderness.  Rest with your legs elevated if you have leg cramps or low back pain.  If you develop bulging veins (varicose veins) in your legs: ? Wear support hose as told by your health care provider. ? Elevate your feet for 15 minutes, 3-4 times a day. ? Limit salt in your diet. Safety  Wear your seat belt at all times when driving or riding in a car.  Talk with your health care provider if someone is verbally or physically abusive to you.  Talk with your health care provider if you are feeling sad or have  thoughts of hurting yourself. Lifestyle  Do not use hot tubs, steam rooms, or saunas.  Do not douche. Do not use tampons or scented sanitary pads.  Do not use herbal remedies, alcohol, illegal drugs, or medicines that are not approved by your health care provider. Chemicals in these products can harm your baby.  Do not use any products that contain nicotine or tobacco, such as cigarettes, e-cigarettes, and chewing tobacco. If you need help quitting, ask your health care provider.  Avoid cat litter boxes and soil used by cats. These carry germs that can cause birth defects in the baby and possibly loss of the unborn baby (fetus) by miscarriage or stillbirth. General instructions  During routine prenatal visits in the first trimester, your health care provider will do a physical exam, perform necessary tests, and ask you how things are going. Keep all follow-up visits. This is important.  Ask for help if you have counseling or nutritional needs during pregnancy. Your health care provider can offer advice or refer you to specialists for help with various needs.  Schedule a dentist appointment. At home, brush your teeth with a soft toothbrush. Floss gently.  Write down your questions. Take them to your prenatal visits. Where to find more information  American Pregnancy Association: americanpregnancy.org  American College of Obstetricians and Gynecologists: acog.org/en/Womens%20Health/Pregnancy  Office on Women's Health: womenshealth.gov/pregnancy Contact a health care provider if you have:  Dizziness.  A fever.  Mild pelvic cramps, pelvic pressure, or nagging pain in the abdominal area.  Nausea, vomiting, or diarrhea that lasts for 24 hours or longer.  A bad-smelling vaginal discharge.  Pain when you urinate.  Known exposure to a contagious illness, such as chickenpox, measles, Zika virus, HIV, or hepatitis. Get help right away if you have:  Spotting or bleeding from your  vagina.  Severe abdominal cramping or pain.  Shortness of breath or chest pain.  Any kind of trauma, such as from a fall or a car crash.  New or increased pain, swelling, or redness in an arm or leg. Summary  The first trimester of pregnancy starts on the first day of your last menstrual period until the end of week 12 (months 1 through 3).  Eating 4 or 5 small meals a day rather than 3 large meals may help to relieve nausea and vomiting.  Do not use any products that contain nicotine or tobacco, such as cigarettes, e-cigarettes, and chewing tobacco. If you need help quitting, ask your health care provider.  Keep all follow-up visits. This is important. This information is not intended to replace advice given to you by your health care provider. Make sure you discuss any questions you have with your health care provider. Document Revised: 12/07/2019 Document Reviewed: 10/13/2019 [employer] Patient Education  2021 [employer]   Contraception Choices Contraception, also called birth control, refers to methods or devices that prevent pregnancy. Hormonal   methods Contraceptive implant A contraceptive implant is a thin, plastic tube that contains a hormone that prevents pregnancy. It is different from an intrauterine device (IUD). It is inserted into the upper part of the arm by a health care provider. Implants can be effective for up to 3 years. Progestin-only injections Progestin-only injections are injections of progestin, a synthetic form of the hormone progesterone. They are given every 3 months by a health care provider. Birth control pills Birth control pills are pills that contain hormones that prevent pregnancy. They must be taken once a day, preferably at the same time each day. A prescription is needed to use this method of contraception. Birth control patch The birth control patch contains hormones that prevent pregnancy. It is placed on the skin and must be changed once a  week for three weeks and removed on the fourth week. A prescription is needed to use this method of contraception. Vaginal ring A vaginal ring contains hormones that prevent pregnancy. It is placed in the vagina for three weeks and removed on the fourth week. After that, the process is repeated with a new ring. A prescription is needed to use this method of contraception. Emergency contraceptive Emergency contraceptives prevent pregnancy after unprotected sex. They come in pill form and can be taken up to 5 days after sex. They work best the sooner they are taken after having sex. Most emergency contraceptives are available without a prescription. This method should not be used as your only form of birth control.   Barrier methods Female condom A female condom is a thin sheath that is worn over the penis during sex. Condoms keep sperm from going inside a woman's body. They can be used with a sperm-killing substance (spermicide) to increase their effectiveness. They should be thrown away after one use. Female condom A female condom is a soft, loose-fitting sheath that is put into the vagina before sex. The condom keeps sperm from going inside a woman's body. They should be thrown away after one use. Diaphragm A diaphragm is a soft, dome-shaped barrier. It is inserted into the vagina before sex, along with a spermicide. The diaphragm blocks sperm from entering the uterus, and the spermicide kills sperm. A diaphragm should be left in the vagina for 6-8 hours after sex and removed within 24 hours. A diaphragm is prescribed and fitted by a health care provider. A diaphragm should be replaced every 1-2 years, after giving birth, after gaining more than 15 lb (6.8 kg), and after pelvic surgery. Cervical cap A cervical cap is a round, soft latex or plastic cup that fits over the cervix. It is inserted into the vagina before sex, along with spermicide. It blocks sperm from entering the uterus. The cap should be  left in place for 6-8 hours after sex and removed within 48 hours. A cervical cap must be prescribed and fitted by a health care provider. It should be replaced every 2 years. Sponge A sponge is a soft, circular piece of polyurethane foam with spermicide in it. The sponge helps block sperm from entering the uterus, and the spermicide kills sperm. To use it, you make it wet and then insert it into the vagina. It should be inserted before sex, left in for at least 6 hours after sex, and removed and thrown away within 30 hours. Spermicides Spermicides are chemicals that kill or block sperm from entering the cervix and uterus. They can come as a cream, jelly, suppository, foam, or tablet. A spermicide   should be inserted into the vagina with an applicator at least 10-15 minutes before sex to allow time for it to work. The process must be repeated every time you have sex. Spermicides do not require a prescription.   Intrauterine contraception Intrauterine device (IUD) An IUD is a T-shaped device that is put in a woman's uterus. There are two types:  Hormone IUD.This type contains progestin, a synthetic form of the hormone progesterone. This type can stay in place for 3-5 years.  Copper IUD.This type is wrapped in copper wire. It can stay in place for 10 years. Permanent methods of contraception Female tubal ligation In this method, a woman's fallopian tubes are sealed, tied, or blocked during surgery to prevent eggs from traveling to the uterus. Hysteroscopic sterilization In this method, a small, flexible insert is placed into each fallopian tube. The inserts cause scar tissue to form in the fallopian tubes and block them, so sperm cannot reach an egg. The procedure takes about 3 months to be effective. Another form of birth control must be used during those 3 months. Female sterilization This is a procedure to tie off the tubes that carry sperm (vasectomy). After the procedure, the man can still  ejaculate fluid (semen). Another form of birth control must be used for 3 months after the procedure. Natural planning methods Natural family planning In this method, a couple does not have sex on days when the woman could become pregnant. Calendar method In this method, the woman keeps track of the length of each menstrual cycle, identifies the days when pregnancy can happen, and does not have sex on those days. Ovulation method In this method, a couple avoids sex during ovulation. Symptothermal method This method involves not having sex during ovulation. The woman typically checks for ovulation by watching changes in her temperature and in the consistency of cervical mucus. Post-ovulation method In this method, a couple waits to have sex until after ovulation. Where to find more information  Centers for Disease Control and Prevention: www.cdc.gov Summary  Contraception, also called birth control, refers to methods or devices that prevent pregnancy.  Hormonal methods of contraception include implants, injections, pills, patches, vaginal rings, and emergency contraceptives.  Barrier methods of contraception can include female condoms, female condoms, diaphragms, cervical caps, sponges, and spermicides.  There are two types of IUDs (intrauterine devices). An IUD can be put in a woman's uterus to prevent pregnancy for 3-5 years.  Permanent sterilization can be done through a procedure for males and females. Natural family planning methods involve nothaving sex on days when the woman could become pregnant. This information is not intended to replace advice given to you by your health care provider. Make sure you discuss any questions you have with your health care provider. Document Revised: 12/05/2019 Document Reviewed: 12/05/2019 Elsevier Patient Education  2021 [employer]   Breastfeeding  Choosing to breastfeed is one of the best decisions you can make for yourself and your baby. A  change in hormones during pregnancy causes your breasts to make breast milk in your milk-producing glands. Hormones prevent breast milk from being released before your baby is born. They also prompt milk flow after birth. Once breastfeeding has begun, thoughts of your baby, as well as his or her sucking or crying, can stimulate the release of milk from your milk-producing glands. Benefits of breastfeeding Research shows that breastfeeding offers many health benefits for infants and mothers. It also offers a cost-free and convenient way to feed your baby.   For your baby  Your first milk (colostrum) helps your baby's digestive system to function better.  Special cells in your milk (antibodies) help your baby to fight off infections.  Breastfed babies are less likely to develop asthma, allergies, obesity, or type 2 diabetes. They are also at lower risk for sudden infant death syndrome (SIDS).  Nutrients in breast milk are better able to meet your baby's needs compared to infant formula.  Breast milk improves your baby's brain development. For you  Breastfeeding helps to create a very special bond between you and your baby.  Breastfeeding is convenient. Breast milk costs nothing and is always available at the correct temperature.  Breastfeeding helps to burn calories. It helps you to lose the weight that you gained during pregnancy.  Breastfeeding makes your uterus return faster to its size before pregnancy. It also slows bleeding (lochia) after you give birth.  Breastfeeding helps to lower your risk of developing type 2 diabetes, osteoporosis, rheumatoid arthritis, cardiovascular disease, and breast, ovarian, uterine, and endometrial cancer later in life. Breastfeeding basics Starting breastfeeding  Find a comfortable place to sit or lie down, with your neck and back well-supported.  Place a pillow or a rolled-up blanket under your baby to bring him or her to the level of your breast (if  you are seated). Nursing pillows are specially designed to help support your arms and your baby while you breastfeed.  Make sure that your baby's tummy (abdomen) is facing your abdomen.  Gently massage your breast. With your fingertips, massage from the outer edges of your breast inward toward the nipple. This encourages milk flow. If your milk flows slowly, you may need to continue this action during the feeding.  Support your breast with 4 fingers underneath and your thumb above your nipple (make the letter "C" with your hand). Make sure your fingers are well away from your nipple and your baby's mouth.  Stroke your baby's lips gently with your finger or nipple.  When your baby's mouth is open wide enough, quickly bring your baby to your breast, placing your entire nipple and as much of the areola as possible into your baby's mouth. The areola is the colored area around your nipple. ? More areola should be visible above your baby's upper lip than below the lower lip. ? Your baby's lips should be opened and extended outward (flanged) to ensure an adequate, comfortable latch. ? Your baby's tongue should be between his or her lower gum and your breast.  Make sure that your baby's mouth is correctly positioned around your nipple (latched). Your baby's lips should create a seal on your breast and be turned out (everted).  It is common for your baby to suck about 2-3 minutes in order to start the flow of breast milk. Latching Teaching your baby how to latch onto your breast properly is very important. An improper latch can cause nipple pain, decreased milk supply, and poor weight gain in your baby. Also, if your baby is not latched onto your nipple properly, he or she may swallow some air during feeding. This can make your baby fussy. Burping your baby when you switch breasts during the feeding can help to get rid of the air. However, teaching your baby to latch on properly is still the best way to  prevent fussiness from swallowing air while breastfeeding. Signs that your baby has successfully latched onto your nipple  Silent tugging or silent sucking, without causing you pain. Infant's lips should be   extended outward (flanged).  Swallowing heard between every 3-4 sucks once your milk has started to flow (after your let-down milk reflex occurs).  Muscle movement above and in front of his or her ears while sucking. Signs that your baby has not successfully latched onto your nipple  Sucking sounds or smacking sounds from your baby while breastfeeding.  Nipple pain. If you think your baby has not latched on correctly, slip your finger into the corner of your baby's mouth to break the suction and place it between your baby's gums. Attempt to start breastfeeding again. Signs of successful breastfeeding Signs from your baby  Your baby will gradually decrease the number of sucks or will completely stop sucking.  Your baby will fall asleep.  Your baby's body will relax.  Your baby will retain a small amount of milk in his or her mouth.  Your baby will let go of your breast by himself or herself. Signs from you  Breasts that have increased in firmness, weight, and size 1-3 hours after feeding.  Breasts that are softer immediately after breastfeeding.  Increased milk volume, as well as a change in milk consistency and color by the fifth day of breastfeeding.  Nipples that are not sore, cracked, or bleeding. Signs that your baby is getting enough milk  Wetting at least 1-2 diapers during the first 24 hours after birth.  Wetting at least 5-6 diapers every 24 hours for the first week after birth. The urine should be clear or pale yellow by the age of 5 days.  Wetting 6-8 diapers every 24 hours as your baby continues to grow and develop.  At least 3 stools in a 24-hour period by the age of 5 days. The stool should be soft and yellow.  At least 3 stools in a 24-hour period by the  age of 7 days. The stool should be seedy and yellow.  No loss of weight greater than 10% of birth weight during the first 3 days of life.  Average weight gain of 4-7 oz (113-198 g) per week after the age of 4 days.  Consistent daily weight gain by the age of 5 days, without weight loss after the age of 2 weeks. After a feeding, your baby may spit up a small amount of milk. This is normal. Breastfeeding frequency and duration Frequent feeding will help you make more milk and can prevent sore nipples and extremely full breasts (breast engorgement). Breastfeed when you feel the need to reduce the fullness of your breasts or when your baby shows signs of hunger. This is called "breastfeeding on demand." Signs that your baby is hungry include:  Increased alertness, activity, or restlessness.  Movement of the head from side to side.  Opening of the mouth when the corner of the mouth or cheek is stroked (rooting).  Increased sucking sounds, smacking lips, cooing, sighing, or squeaking.  Hand-to-mouth movements and sucking on fingers or hands.  Fussing or crying. Avoid introducing a pacifier to your baby in the first 4-6 weeks after your baby is born. After this time, you may choose to use a pacifier. Research has shown that pacifier use during the first year of a baby's life decreases the risk of sudden infant death syndrome (SIDS). Allow your baby to feed on each breast as long as he or she wants. When your baby unlatches or falls asleep while feeding from the first breast, offer the second breast. Because newborns are often sleepy in the first few weeks of   life, you may need to awaken your baby to get him or her to feed. Breastfeeding times will vary from baby to baby. However, the following rules can serve as a guide to help you make sure that your baby is properly fed:  Newborns (babies 4 weeks of age or younger) may breastfeed every 1-3 hours.  Newborns should not go without breastfeeding  for longer than 3 hours during the day or 5 hours during the night.  You should breastfeed your baby a minimum of 8 times in a 24-hour period. Breast milk pumping Pumping and storing breast milk allows you to make sure that your baby is exclusively fed your breast milk, even at times when you are unable to breastfeed. This is especially important if you go back to work while you are still breastfeeding, or if you are not able to be present during feedings. Your lactation consultant can help you find a method of pumping that works best for you and give you guidelines about how long it is safe to store breast milk.      Caring for your breasts while you breastfeed Nipples can become dry, cracked, and sore while breastfeeding. The following recommendations can help keep your breasts moisturized and healthy:  Avoid using soap on your nipples.  Wear a supportive bra designed especially for nursing. Avoid wearing underwire-style bras or extremely tight bras (sports bras).  Air-dry your nipples for 3-4 minutes after each feeding.  Use only cotton bra pads to absorb leaked breast milk. Leaking of breast milk between feedings is normal.  Use lanolin on your nipples after breastfeeding. Lanolin helps to maintain your skin's normal moisture barrier. Pure lanolin is not harmful (not toxic) to your baby. You may also hand express a few drops of breast milk and gently massage that milk into your nipples and allow the milk to air-dry. In the first few weeks after giving birth, some women experience breast engorgement. Engorgement can make your breasts feel heavy, warm, and tender to the touch. Engorgement peaks within 3-5 days after you give birth. The following recommendations can help to ease engorgement:  Completely empty your breasts while breastfeeding or pumping. You may want to start by applying warm, moist heat (in the shower or with warm, water-soaked hand towels) just before feeding or pumping. This  increases circulation and helps the milk flow. If your baby does not completely empty your breasts while breastfeeding, pump any extra milk after he or she is finished.  Apply ice packs to your breasts immediately after breastfeeding or pumping, unless this is too uncomfortable for you. To do this: ? Put ice in a plastic bag. ? Place a towel between your skin and the bag. ? Leave the ice on for 20 minutes, 2-3 times a day.  Make sure that your baby is latched on and positioned properly while breastfeeding. If engorgement persists after 48 hours of following these recommendations, contact your health care provider or a lactation consultant. Overall health care recommendations while breastfeeding  Eat 3 healthy meals and 3 snacks every day. Well-nourished mothers who are breastfeeding need an additional 450-500 calories a day. You can meet this requirement by increasing the amount of a balanced diet that you eat.  Drink enough water to keep your urine pale yellow or clear.  Rest often, relax, and continue to take your prenatal vitamins to prevent fatigue, stress, and low vitamin and mineral levels in your body (nutrient deficiencies).  Do not use any products that contain   nicotine or tobacco, such as cigarettes and e-cigarettes. Your baby may be harmed by chemicals from cigarettes that pass into breast milk and exposure to secondhand smoke. If you need help quitting, ask your health care provider.  Avoid alcohol.  Do not use illegal drugs or marijuana.  Talk with your health care provider before taking any medicines. These include over-the-counter and prescription medicines as well as vitamins and herbal supplements. Some medicines that may be harmful to your baby can pass through breast milk.  It is possible to become pregnant while breastfeeding. If birth control is desired, ask your health care provider about options that will be safe while breastfeeding your baby. Where to find more  information: [employer]: www.llli.org Contact a health care provider if:  You feel like you want to stop breastfeeding or have become frustrated with breastfeeding.  Your nipples are cracked or bleeding.  Your breasts are red, tender, or warm.  You have: ? Painful breasts or nipples. ? A swollen area on either breast. ? A fever or chills. ? Nausea or vomiting. ? Drainage other than breast milk from your nipples.  Your breasts do not become full before feedings by the fifth day after you give birth.  You feel sad and depressed.  Your baby is: ? Too sleepy to eat well. ? Having trouble sleeping. ? More than 1 week old and wetting fewer than 6 diapers in a 24-hour period. ? Not gaining weight by 5 days of age.  Your baby has fewer than 3 stools in a 24-hour period.  Your baby's skin or the white parts of his or her eyes become yellow. Get help right away if:  Your baby is overly tired (lethargic) and does not want to wake up and feed.  Your baby develops an unexplained fever. Summary  Breastfeeding offers many health benefits for infant and mothers.  Try to breastfeed your infant when he or she shows early signs of hunger.  Gently tickle or stroke your baby's lips with your finger or nipple to allow the baby to open his or her mouth. Bring the baby to your breast. Make sure that much of the areola is in your baby's mouth. Offer one side and burp the baby before you offer the other side.  Talk with your health care provider or lactation consultant if you have questions or you face problems as you breastfeed. This information is not intended to replace advice given to you by your health care provider. Make sure you discuss any questions you have with your health care provider. Document Revised: 09/24/2017 Document Reviewed: 08/01/2016 Elsevier Patient Education  2021 Elsevier Inc.  

## 2020-08-15 ENCOUNTER — Encounter: Payer: BC Managed Care – PPO | Admitting: Obstetrics & Gynecology

## 2020-08-15 LAB — CBC/D/PLT+RPR+RH+ABO+RUB AB...
Antibody Screen: NEGATIVE
Basophils Absolute: 0 10*3/uL (ref 0.0–0.2)
Basos: 0 %
EOS (ABSOLUTE): 0.1 10*3/uL (ref 0.0–0.4)
Eos: 1 %
HCV Ab: 0.1 s/co ratio (ref 0.0–0.9)
HIV Screen 4th Generation wRfx: NONREACTIVE
Hematocrit: 38.2 % (ref 34.0–46.6)
Hemoglobin: 13.4 g/dL (ref 11.1–15.9)
Hepatitis B Surface Ag: NEGATIVE
Immature Grans (Abs): 0 10*3/uL (ref 0.0–0.1)
Immature Granulocytes: 0 %
Lymphocytes Absolute: 2.3 10*3/uL (ref 0.7–3.1)
Lymphs: 20 %
MCH: 30.1 pg (ref 26.6–33.0)
MCHC: 35.1 g/dL (ref 31.5–35.7)
MCV: 86 fL (ref 79–97)
Monocytes Absolute: 0.7 10*3/uL (ref 0.1–0.9)
Monocytes: 6 %
Neutrophils Absolute: 8.2 10*3/uL — ABNORMAL HIGH (ref 1.4–7.0)
Neutrophils: 73 %
Platelets: 349 10*3/uL (ref 150–450)
RBC: 4.45 x10E6/uL (ref 3.77–5.28)
RDW: 13.2 % (ref 11.7–15.4)
RPR Ser Ql: NONREACTIVE
Rh Factor: POSITIVE
Rubella Antibodies, IGG: 3.05 index (ref >0.99)
WBC: 11.4 10*3/uL — ABNORMAL HIGH (ref 3.4–10.8)

## 2020-08-15 LAB — HEMOGLOBIN A1C
Est. average glucose Bld gHb Est-mCnc: 108 mg/dL
Hgb A1c MFr Bld: 5.4 % (ref 4.8–5.6)

## 2020-08-15 LAB — HCV INTERPRETATION

## 2020-08-17 LAB — URINE CULTURE, OB REFLEX

## 2020-08-17 LAB — CULTURE, OB URINE

## 2020-08-24 ENCOUNTER — Encounter: Payer: Self-pay | Admitting: Neurology

## 2020-09-07 ENCOUNTER — Encounter: Payer: Self-pay | Admitting: *Deleted

## 2020-09-12 ENCOUNTER — Encounter: Payer: Self-pay | Admitting: Obstetrics and Gynecology

## 2020-09-12 ENCOUNTER — Telehealth (INDEPENDENT_AMBULATORY_CARE_PROVIDER_SITE_OTHER): Payer: Medicaid Other | Admitting: Obstetrics and Gynecology

## 2020-09-12 DIAGNOSIS — O26892 Other specified pregnancy related conditions, second trimester: Secondary | ICD-10-CM

## 2020-09-12 DIAGNOSIS — O09512 Supervision of elderly primigravida, second trimester: Secondary | ICD-10-CM

## 2020-09-12 DIAGNOSIS — R519 Headache, unspecified: Secondary | ICD-10-CM | POA: Insufficient documentation

## 2020-09-12 DIAGNOSIS — O09519 Supervision of elderly primigravida, unspecified trimester: Secondary | ICD-10-CM

## 2020-09-12 DIAGNOSIS — Z3A15 15 weeks gestation of pregnancy: Secondary | ICD-10-CM

## 2020-09-12 DIAGNOSIS — O0992 Supervision of high risk pregnancy, unspecified, second trimester: Secondary | ICD-10-CM

## 2020-09-12 DIAGNOSIS — O0990 Supervision of high risk pregnancy, unspecified, unspecified trimester: Secondary | ICD-10-CM

## 2020-09-12 HISTORY — DX: Other specified pregnancy related conditions, second trimester: O26.892

## 2020-09-12 HISTORY — DX: Headache, unspecified: R51.9

## 2020-09-12 MED ORDER — BUTALBITAL-APAP-CAFFEINE 50-325-40 MG PO CAPS
1.0000 | ORAL_CAPSULE | Freq: Four times a day (QID) | ORAL | 1 refills | Status: DC | PRN
Start: 2020-09-12 — End: 2021-04-24

## 2020-09-12 NOTE — Progress Notes (Signed)
Had vaginal bleeding (spotting) the week after first visit, which was Feb 1st 2022. Also, complains of reoccurring migraines. She feel as though she has been getting them more often since being pregnant. Also, wants to know what other medicines can she take for allergies.  Dawayne Patricia, CMA  09/12/20

## 2020-09-12 NOTE — Progress Notes (Signed)
I connected with  Katherine Maldonado on 09/12/20 at  1:55 PM EST by telephone and verified that I am speaking with the correct person using two identifiers.   I discussed the limitations, risks, security and privacy concerns of performing an evaluation and management service by telephone and the availability of in person appointments. I also discussed with the patient that there may be a patient responsible charge related to this service. The patient expressed understanding and agreed to proceed.  Guy Begin, CMA 09/12/2020  1:31 PM

## 2020-09-12 NOTE — Patient Instructions (Signed)
Alpha-Fetoprotein Test Why am I having this test? The alpha-fetoprotein test is a lab test most commonly used for pregnant women to help screen for birth defects in their unborn baby. It can be used to screen for chromosome (DNA) abnormalities, problems with the brain or spinal cord, or problems with the abdominal wall of the unborn baby (fetus). The alpha-fetoprotein test may also be done for men or nonpregnant women to check for certain cancers. What is being tested? This test measures the amount of alpha-fetoprotein (AFP) in your blood. AFP is a protein that is made by the liver. Levels can be detected in the mother's blood during pregnancy, starting at 10 weeks and peaking at 16-18 weeks of the pregnancy. Abnormal levels can sometimes be a sign of a birth defect in the baby. Certain cancers can cause a high level of AFP in men and nonpregnant women. What kind of sample is taken? A blood sample is required for this test. It is usually collected by inserting a needle into a blood vessel.   How are the results reported? Your test results will be reported as values. Your health care provider will compare your results to normal ranges that were established after testing a large group of people (reference values). Reference values may vary among labs and hospitals. For this test, common reference values are:  Adult: Less than 40 ng/mL or less than 40 mcg/L (SI units).  Child younger than 1 year: Less than 30 ng/mL. If you are pregnant, the values may also vary based on how long you have been pregnant. What do the results mean? Results that are above the reference values in pregnant women may indicate the following for the baby:  Neural tube defects, such as abnormalities of the spinal cord or brain.  Abdominal wall defects.  Multiple pregnancy such as twins.  Fetal distress or fetal death. Results that are above the reference values in men or nonpregnant women may indicate:  Reproductive  cancers, such as ovarian or testicular cancer.  Liver cancer.  Liver cell death.  Other types of cancer. Very low levels of AFP in pregnant women may indicate Down syndrome for the baby. Talk with your health care provider about what your results mean. Questions to ask your health care provider Ask your health care provider, or the department that is doing the test:  When will my results be ready?  How will I get my results?  What are my treatment options?  What other tests do I need?  What are my next steps? Summary  The alpha-fetoprotein test is done on pregnant women to help screen for birth defects in their unborn baby.  Certain cancers can cause a high level of AFP in men and nonpregnant women.  For this test, a blood sample is usually collected by inserting a needle into a blood vessel.  Talk with your health care provider about what your results mean. This information is not intended to replace advice given to you by your health care provider. Make sure you discuss any questions you have with your health care provider. Document Revised: 01/20/2020 Document Reviewed: 01/20/2020 Elsevier Patient Education  2021 Elsevier Inc.  

## 2020-09-12 NOTE — Progress Notes (Signed)
   OBSTETRICS PRENATAL VIRTUAL VISIT ENCOUNTER NOTE  Provider location: Center for [facility]  I connected with Katherine Maldonado on 09/12/20 at  1:55 PM EST by MyChart Video Encounter at home and verified that I am speaking with the correct person using two identifiers.   I discussed the limitations, risks, security and privacy concerns of performing an evaluation and management service virtually and the availability of in person appointments. I also discussed with the patient that there may be a patient responsible charge related to this service. The patient expressed understanding and agreed to proceed. Subjective:  Katherine Maldonado is a 35 y.o. G1P0 at [redacted]w[redacted]d being seen today for ongoing prenatal care.  She is currently monitored for the following issues for this high-risk pregnancy and has Burning sensation of lower extremity; Supervision of high risk pregnancy, antepartum; AMA (advanced maternal age) primigravida 36+; [redacted] weeks gestation of pregnancy; and Headache in pregnancy, antepartum, second trimester on their problem list.  Patient reports headache.  Contractions: Not present. Vag. Bleeding: None.  Movement: Absent. Denies any leaking of fluid.   The following portions of the patient's history were reviewed and updated as appropriate: allergies, current medications, past family history, past medical history, past social history, past surgical history and problem list.    Pt requests assistance with headaches and seasonal allergies. Objective:  There were no vitals filed for this visit.  Fetal Status:     Movement: Absent     General:  Alert, oriented and cooperative. Patient is in no acute distress.  Respiratory: Normal respiratory effort, no problems with respiration noted  Mental Status: Normal mood and affect. Normal behavior. Normal judgment and thought content.  Rest of physical exam deferred due to type of encounter  Imaging: No results  found.  Assessment and Plan:  Pregnancy: G1P0 at [redacted]w[redacted]d 1. Supervision of high risk pregnancy, antepartum   2. Advanced maternal age, primigravida, antepartum   3. [redacted] weeks gestation of pregnancy   4. Headache in pregnancy, antepartum, second trimester  - Butalbital-APAP-Caffeine 50-325-40 MG capsule; Take 1-2 capsules by mouth every 6 (six) hours as needed for headache.  Dispense: 30 capsule; Refill: 1  Preterm labor symptoms and general obstetric precautions including but not limited to vaginal bleeding, contractions, leaking of fluid and fetal movement were reviewed in detail with the patient. I discussed the assessment and treatment plan with the patient. The patient was provided an opportunity to ask questions and all were answered. The patient agreed with the plan and demonstrated an understanding of the instructions. The patient was advised to call back or seek an in-person office evaluation/go to [facility] at [facility] for any urgent or concerning symptoms. Please refer to After Visit Summary for other counseling recommendations.   I provided 10 minutes of face-to-face time during this encounter.  Return in about 4 weeks (around 10/10/2020) for [facility], in person, .  Future Appointments  Date Time Provider Department Center  10/10/2020  8:30 AM [facility] NURSE [facility]  10/10/2020  8:45 AM [facility] US5 [facility]  10/10/2020  9:55 AM Levie Heritage, DO [facility]  10/30/2020  2:50 PM Drema Dallas, DO [facility] None    Warden Fillers, MD Center for [facility], [facility]

## 2020-09-18 ENCOUNTER — Telehealth: Payer: Self-pay | Admitting: Lactation Services

## 2020-09-18 NOTE — Telephone Encounter (Signed)
Called patient with results of [facility] Scre[facility] results.   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 Discussed her calling [facility] at [phone] to schedule a telephone Genetic Counseling session to discuss results. Reviewed they will recommend partner testing.

## 2020-09-26 ENCOUNTER — Other Ambulatory Visit: Payer: Self-pay | Admitting: Lactation Services

## 2020-09-26 MED ORDER — CYCLOBENZAPRINE HCL 10 MG PO TABS: 10.0000 mg | ORAL_TABLET | Freq: Three times a day (TID) | ORAL | 1 refills | Status: DC | PRN

## 2020-09-26 NOTE — Progress Notes (Signed)
Flexeril ordered per Dr. Crissie Reese for left back pain not controlled by heat, ice and Tylenol.

## 2020-10-01 ENCOUNTER — Encounter: Payer: Self-pay | Admitting: *Deleted

## 2020-10-05 ENCOUNTER — Telehealth: Payer: Self-pay | Admitting: General Practice

## 2020-10-05 NOTE — Telephone Encounter (Signed)
   Katherine Maldonado DOB: 11-26-1985 MRN: [id number]   RIDER WAIVER AND RELEASE OF LIABILITY  For purposes of improving physical access to our facilities, [facility] is pleased to partner with third parties to provide [facility] patients or other authorized individuals the option of convenient, on-demand ground transportation services (the Chiropractor") through use of the technology service that enables users to request on-demand ground transportation from independent third-party providers.  By opting to use and accept these [employer], I, the undersigned, hereby agree on behalf of myself, and on behalf of any minor child using the [employer] for whom I am the parent or legal guardian, as follows:  1. Science writer provided to me are provided by independent third-party transportation providers who are not [employer] or employees and who are unaffiliated with [employer]. 2. [facility] is neither a transportation carrier nor a common or public carrier. 3. [facility] has no control over the quality or safety of the transportation that occurs as a result of the [employer]. 4. [facility] cannot guarantee that any third-party transportation provider will complete any arranged transportation service. 5. [facility] makes no representation, warranty, or guarantee regarding the reliability, timeliness, quality, safety, suitability, or availability of any of the Transport Services or that they will be error free. 6. I fully understand that traveling by vehicle involves risks and dangers of serious bodily injury, including permanent disability, paralysis, and death. I agree, on behalf of myself and on behalf of any minor child using the Transport Services for whom I am the parent or legal guardian, that the entire risk arising out of my use of the [employer] remains solely with me, to the maximum extent permitted under applicable law. 7. The [employer] are provided "as is" and "as available." [facility] disclaims all representations and warranties, express, implied or statutory, not expressly set out in these terms, including the implied warranties of merchantability and fitness for a particular purpose. 8. I hereby waive and release [place] [facility], its agents, employees, officers, directors, representatives, insurers, attorneys, assigns, successors, subsidiaries, and affiliates from any and all past, present, or future claims, demands, liabilities, actions, causes of action, or suits of any kind directly or indirectly arising from acceptance and use of the [employer]. 9. I further waive and release [facility] and its affiliates from all present and future liability and responsibility for any injury or death to persons or damages to property caused by or related to the use of the [employer]. 10. I have read this Waiver and Release of Liability, and I understand the terms used in it and their legal significance. This Waiver is freely and voluntarily given with the understanding that my right (as well as the right of any minor child for whom I am the parent or legal guardian using the [employer]) to legal recourse against [place] in connection with the [employer] is knowingly surrendered in return for use of these services.   I attest that I read the consent document to Katherine Maldonado, gave Katherine Maldonado the opportunity to ask questions and answered the questions asked (if any). I affirm that Katherine Maldonado then provided consent for she's participation in this program.     Katherine Maldonado

## 2020-10-10 ENCOUNTER — Ambulatory Visit: Payer: Medicaid Other | Admitting: *Deleted

## 2020-10-10 ENCOUNTER — Other Ambulatory Visit: Payer: Self-pay

## 2020-10-10 ENCOUNTER — Ambulatory Visit (INDEPENDENT_AMBULATORY_CARE_PROVIDER_SITE_OTHER): Payer: Medicaid Other | Admitting: Obstetrics and Gynecology

## 2020-10-10 ENCOUNTER — Encounter: Payer: Self-pay | Admitting: *Deleted

## 2020-10-10 ENCOUNTER — Other Ambulatory Visit: Payer: Self-pay | Admitting: *Deleted

## 2020-10-10 ENCOUNTER — Ambulatory Visit: Payer: Medicaid Other | Attending: Obstetrics and Gynecology

## 2020-10-10 VITALS — BP 111/76 | HR 74 | Wt 218.9 lb

## 2020-10-10 DIAGNOSIS — O0990 Supervision of high risk pregnancy, unspecified, unspecified trimester: Secondary | ICD-10-CM | POA: Diagnosis present

## 2020-10-10 DIAGNOSIS — O09519 Supervision of elderly primigravida, unspecified trimester: Secondary | ICD-10-CM | POA: Diagnosis present

## 2020-10-10 DIAGNOSIS — F4321 Adjustment disorder with depressed mood: Secondary | ICD-10-CM

## 2020-10-10 DIAGNOSIS — Z3A19 19 weeks gestation of pregnancy: Secondary | ICD-10-CM

## 2020-10-10 DIAGNOSIS — Z362 Encounter for other antenatal screening follow-up: Secondary | ICD-10-CM

## 2020-10-10 NOTE — Patient Instructions (Signed)
Sciatica Rehab Ask your health care provider which exercises are safe for you. Do exercises exactly as told by your health care provider and adjust them as directed. It is normal to feel mild stretching, pulling, tightness, or discomfort as you do these exercises. Stop right away if you feel sudden pain or your pain gets worse. Do not begin these exercises until told by your health care provider. Stretching and range-of-motion exercises These exercises warm up your muscles and joints and improve the movement and flexibility of your hips and back. These exercises also help to relieve pain, numbness, and tingling. Sciatic nerve glide 1. Sit in a chair with your head facing down toward your chest. Place your hands behind your back. Let your shoulders slump forward. 2. Slowly straighten one of your legs while you tilt your head back as if you are looking toward the ceiling. Only straighten your leg as far as you can without making your symptoms worse. 3. Hold this position for __________ seconds. 4. Slowly return to the starting position. 5. Repeat with your other leg. Repeat __________ times. Complete this exercise __________ times a day. Knee to chest with hip adduction and internal rotation 1. Lie on your back on a firm surface with both legs straight. 2. Bend one of your knees and move it up toward your chest until you feel a gentle stretch in your lower back and buttock. Then, move your knee toward the shoulder that is on the opposite side from your leg. This is hip adduction and internal rotation. ? Hold your leg in this position by holding on to the front of your knee. 3. Hold this position for __________ seconds. 4. Slowly return to the starting position. 5. Repeat with your other leg. Repeat __________ times. Complete this exercise __________ times a day.      Strengthening exercises These exercises build strength and endurance in your back. Endurance is the ability to use your muscles for  a long time, even after they get tired. Pelvic tilt This exercise strengthens the muscles that lie deep in the abdomen. 1. Lie on your back on a firm surface. Bend your knees and keep your feet flat on the floor. 2. Tense your abdominal muscles. Tip your pelvis up toward the ceiling and flatten your lower back into the floor. ? To help with this exercise, you may place a small towel under your lower back and try to push your back into the towel. 3. Hold this position for __________ seconds. 4. Let your muscles relax completely before you repeat this exercise. Repeat __________ times. Complete this exercise __________ times a day. Alternating arm and leg raises 1. Get on your hands and knees on a firm surface. If you are on a hard floor, you may want to use padding, such as an exercise mat, to cushion your knees. 2. Line up your arms and legs. Your hands should be directly below your shoulders, and your knees should be directly below your hips. 3. Lift your left leg behind you. At the same time, raise your right arm and straighten it in front of you. ? Do not lift your leg higher than your hip. ? Do not lift your arm higher than your shoulder. ? Keep your abdominal and back muscles tight. ? Keep your hips facing the ground. ? Do not arch your back. ? Keep your balance carefully, and do not hold your breath. 4. Hold this position for __________ seconds. 5. Slowly return to the starting position.  6. Repeat with your right leg and your left arm. Repeat __________ times. Complete this exercise __________ times a day.   Posture and body mechanics Good posture and healthy body mechanics can help to relieve stress in your body's tissues and joints. Body mechanics refers to the movements and positions of your body while you do your daily activities. Posture is part of body mechanics. Good posture means:  Your spine is in its natural S-curve position (neutral).  Your shoulders are pulled back  slightly.  Your head is not tipped forward. Follow these guidelines to improve your posture and body mechanics in your everyday activities. Standing  When standing, keep your spine neutral and your feet about hip width apart. Keep a slight bend in your knees. Your ears, shoulders, and hips should line up.  When you do a task in which you stand in one place for a long time, place one foot up on a stable object that is 2-4 inches (5-10 cm) high, such as a footstool. This helps keep your spine neutral.   Sitting  When sitting, keep your spine neutral and keep your feet flat on the floor. Use a footrest, if necessary, and keep your thighs parallel to the floor. Avoid rounding your shoulders, and avoid tilting your head forward.  When working at a desk or a computer, keep your desk at a height where your hands are slightly lower than your elbows. Slide your chair under your desk so you are close enough to maintain good posture.  When working at a computer, place your monitor at a height where you are looking straight ahead and you do not have to tilt your head forward or downward to look at the screen.   Resting  When lying down and resting, avoid positions that are most painful for you.  If you have pain with activities such as sitting, bending, stooping, or squatting, lie in a position in which your body does not bend very much. For example, avoid curling up on your side with your arms and knees near your chest (fetal position).  If you have pain with activities such as standing for a long time or reaching with your arms, lie with your spine in a neutral position and bend your knees slightly. Try the following positions: ? Lying on your side with a pillow between your knees. ? Lying on your back with a pillow under your knees. Lifting  When lifting objects, keep your feet at least shoulder width apart and tighten your abdominal muscles.  Bend your knees and hips and keep your spine neutral.  It is important to lift using the strength of your legs, not your back. Do not lock your knees straight out.  Always ask for help to lift heavy or awkward objects.   This information is not intended to replace advice given to you by your health care provider. Make sure you discuss any questions you have with your health care provider. Document Revised: 10/22/2018 Document Reviewed: 07/22/2018 Elsevier Patient Education  2021 [employer].

## 2020-10-10 NOTE — Progress Notes (Signed)
   PRENATAL VISIT NOTE  Subjective:  Katherine Maldonado is a 35 y.o. G1P0 at [redacted]w[redacted]d being seen today for ongoing prenatal care.  She is currently monitored for the following issues for this low-risk pregnancy and has Burning sensation of lower extremity; Supervision of high risk pregnancy, antepartum; AMA (advanced maternal age) primigravida 35+; [redacted] weeks gestation of pregnancy; and Headache in pregnancy, antepartum, second trimester on their problem list.  Patient reports no complaints.  Contractions: Not present. Vag. Bleeding: None.  Movement: Present. Denies leaking of fluid.   Just returned from her father's funeral. Both of her parents have passed in the past year. Feeling appropriate grief, but endorses not having many supports in this area. Interested in considering counseling.   The following portions of the patient's history were reviewed and updated as appropriate: allergies, current medications, past family history, past medical history, past social history, past surgical history and problem list.   Objective:   Vitals:   10/10/20 1002  BP: 111/76  Pulse: 74  Weight: 218 lb 14.4 oz (99.3 kg)    Fetal Status: Fetal Heart Rate (bpm): 150   Movement: Present     General:  Alert, oriented and cooperative. Patient is in no acute distress.  Skin: Skin is warm and dry. No rash noted.   Cardiovascular: Normal heart rate noted  Respiratory: Normal respiratory effort, no problems with respiration noted  Abdomen: Soft, gravid, appropriate for gestational age.  Pain/Pressure: Absent     Pelvic: Cervical exam deferred        Extremities: Normal range of motion.  Edema: None  Mental Status: Normal mood and affect. Normal behavior. Normal judgment and thought content.   Assessment and Plan:  Pregnancy: G1P0 at [redacted]w[redacted]d 1. Supervision of high risk pregnancy, antepartum Doing well overall, afp drawn today  2. Advanced maternal age, primigravida, antepartum   3. [redacted] weeks gestation of  pregnancy  4. Grief -referral placed for counseling with Jamie  Preterm labor symptoms and general obstetric precautions including but not limited to vaginal bleeding, contractions, leaking of fluid and fetal movement were reviewed in detail with the patient. Please refer to After Visit Summary for other counseling recommendations.   Return in about 4 weeks (around 11/07/2020) for OB.  Future Appointments  Date Time Provider Department Center  10/30/2020  2:50 PM Drema Dallas, DO [facility] None  11/08/2020  7:45 AM [facility] NURSE [facility]  11/08/2020  8:00 AM [facility] US1 [facility]    Gita Kudo, MD

## 2020-10-10 NOTE — Progress Notes (Signed)
States has WIC now ; does not need food market today. Fredrick Dray,RN

## 2020-10-12 LAB — AFP, SERUM, OPEN SPINA BIFIDA
AFP MoM: 1.36
AFP Value: 56.4 ng/mL
Gest. Age on Collection Date: 19 weeks
Maternal Age At EDD: 36.4 yr
OSBR Risk 1 IN: 4034
Test Results:: NEGATIVE
Weight: 219 [lb_av]

## 2020-10-29 NOTE — Progress Notes (Signed)
NEUROLOGY CONSULTATION NOTE  Katherine Maldonado MRN: [id number] DOB: 1986/05/24  Referring provider: Clayton Lefort, MD Primary care provider: Clayton Lefort, MD  Reason for consult: migraines, burning sensation lower extremity  Assessment/Plan:   1.  Migraine without aura, without status migrainosus, not intractable 2.  Left sided low back pain with radiculopathy vs lateral femoral cutaneous neuropathy 3.  Pregnancy at [redacted] weeks gestation  1.  Start magnesium oxide 460m daily and riboflavin 4056mdaily 2.  Tylenol or Fioricet as needed but limit to no more than 2 days out of week to prevent rebound headache 3.  Referral to physical therapy for back pain 4.  Follow up 4 months.   Subjective:  Katherine Maldonado a [age]ear old right-handed female at 2226eeks gestation who presents for left sided burning sensation and migraines.  History supplemented by referring provider's note.  Migraines: Migraines since childhood.  They are severe right frontal pounding headache.  They are associated with photophobia, phonophobia, sometimes nausea.  They typically last 6 hours to all day and occur up to 3-4 days a month.  No known trigger.  No relieving factors.  Since she has become pregnant, they have been worse.  There is more nausea.  She wakes up with them.  They are lasting several hours waking up with them at 8 AM and lasting until 2 PM.  Last month, she had 6 migraines (some related to emotional stress with the passing of her father).    Current treatment:  Fioricet, Tylenol Past treatment:  Ibuprofen, Excedrin  She injured her tail bone 6 or 7 years ago.  She sneezed and developed severe pain.  She had total numbness of the leg.  It subsequently resolved.  She strained her back in September 2021 and had a recurrence of numbness and burning from the the left hip to the anterior thigh up to the knee.  Associated back pain. .  No associated weakness.  Taking Flexeril for back pain.        PAST MEDICAL HISTORY: Past Medical History:  Diagnosis Date  . Anxiety   . Back pain   . Burning sensation of lower extremity    left thigh  . Migraines     PAST SURGICAL HISTORY: Past Surgical History:  Procedure Laterality Date  . TOOTH EXTRACTION      MEDICATIONS: Current Outpatient Medications on File Prior to Visit  Medication Sig Dispense Refill  . acetaminophen (TYLENOL) 650 MG CR tablet Take 650 mg by mouth every 8 (eight) hours as needed for pain.    . Blood Pressure Monitoring (BLOOD PRESSURE KIT) DEVI 1 Device by Does not apply route as needed. 1 each 0  . Butalbital-APAP-Caffeine 50-325-40 MG capsule Take 1-2 capsules by mouth every 6 (six) hours as needed for headache. 30 capsule 1  . cyclobenzaprine (FLEXERIL) 10 MG tablet Take 1 tablet (10 mg total) by mouth every 8 (eight) hours as needed for muscle spasms. 30 tablet 1  . ondansetron (ZOFRAN) 4 MG tablet Take 4 mg by mouth every 8 (eight) hours as needed for nausea or vomiting.    . Prenatal Vit-Fe Fumarate-FA (PRENATAL COMPLETE) 14-0.4 MG TABS Take 1 tablet daily 60 tablet 2   No current facility-administered medications on file prior to visit.    ALLERGIES: Allergies  Allergen Reactions  . Latex Swelling and Rash  . Penicillins Rash and Other (See Comments)    Did it involve swelling of the face/tongue/throat, SOB, or low BP?  Unknown Did it involve sudden or severe rash/hives, skin peeling, or any reaction on the inside of your mouth or nose? Unknown Did you need to seek medical attention at a hospital or doctor's office? Unknown When did it last happen?childhood If all above answers are "NO", may proceed with cephalosporin use.   Childhood reaction     FAMILY HISTORY: Family History  Adopted: Yes  Problem Relation Age of Onset  . Thyroid disease Mother   . COPD Mother   . Diabetes Father   . Hypertension Father   . Thrombocytopenia Father     Objective:  Blood pressure 126/85,  pulse 94, height 5' 7.5" (1.715 m), weight 223 lb (101.2 kg), last menstrual period 06/04/2020, SpO2 96 %. General: No acute distress.  Patient appears well-groomed.   Head:  Normocephalic/atraumatic Eyes:  fundi examined but not visualized Neck: supple, no paraspinal tenderness, full range of motion Back: No paraspinal tenderness Heart: regular rate and rhythm Lungs: Clear to auscultation bilaterally. Vascular: No carotid bruits. Neurological Exam: Mental status: alert and oriented to person, place, and time, recent and remote memory intact, fund of knowledge intact, attention and concentration intact, speech fluent and not dysarthric, language intact. Cranial nerves: CN I: not tested CN II: pupils equal, round and reactive to light, visual fields intact CN III, IV, VI:  full range of motion, no nystagmus, no ptosis CN V: facial sensation intact. CN VII: upper and lower face symmetric CN VIII: hearing intact CN IX, X: gag intact, uvula midline CN XI: sternocleidomastoid and trapezius muscles intact CN XII: tongue midline Bulk & Tone: normal, no fasciculations. Motor:  muscle strength 5/5 throughout Sensation:  Pinprick reduced in left foot.  Vibratory sensation reduced. Deep Tendon Reflexes:  2+ throughout,  toes downgoing.   Finger to nose testing:  Without dysmetria.   Heel to shin:  Without dysmetria.   Gait:  Normal station and stride.  Romberg negative.    Thank you for allowing me to take part in the care of this patient.  Metta Clines, DO  CC: Clayton Lefort, MD

## 2020-10-30 ENCOUNTER — Other Ambulatory Visit: Payer: Self-pay

## 2020-10-30 ENCOUNTER — Ambulatory Visit: Payer: Medicaid Other | Admitting: Neurology

## 2020-10-30 ENCOUNTER — Encounter: Payer: Self-pay | Admitting: Neurology

## 2020-10-30 VITALS — BP 126/85 | HR 94 | Ht 67.5 in | Wt 223.0 lb

## 2020-10-30 DIAGNOSIS — M5442 Lumbago with sciatica, left side: Secondary | ICD-10-CM

## 2020-10-30 DIAGNOSIS — Z3A22 22 weeks gestation of pregnancy: Secondary | ICD-10-CM

## 2020-10-30 DIAGNOSIS — G43009 Migraine without aura, not intractable, without status migrainosus: Secondary | ICD-10-CM

## 2020-10-30 DIAGNOSIS — G8929 Other chronic pain: Secondary | ICD-10-CM | POA: Diagnosis not present

## 2020-10-30 NOTE — Patient Instructions (Addendum)
1.  For headache, take magnesium oxide 400mg  daily and riboflavin (vitamin B2) 400mg  daily 2.  Use butalbital-acetaminophen-caffeine pill or acetaminophen as needed but limit use to no more than 2 days out of week to prevent risk of rebound or medication-overuse headache. 3.  Refer to physical therapy for back pain with radiculopathy 4.  Follow up 4 months.    outpatient physical therapy will contact you for an appointment.

## 2020-11-08 ENCOUNTER — Other Ambulatory Visit: Payer: Self-pay | Admitting: Obstetrics

## 2020-11-08 ENCOUNTER — Encounter: Payer: Self-pay | Admitting: *Deleted

## 2020-11-08 ENCOUNTER — Ambulatory Visit: Payer: Medicaid Other | Attending: Obstetrics

## 2020-11-08 ENCOUNTER — Other Ambulatory Visit: Payer: Self-pay

## 2020-11-08 ENCOUNTER — Ambulatory Visit (INDEPENDENT_AMBULATORY_CARE_PROVIDER_SITE_OTHER): Payer: Medicaid Other | Admitting: Obstetrics & Gynecology

## 2020-11-08 ENCOUNTER — Ambulatory Visit: Payer: Medicaid Other | Admitting: *Deleted

## 2020-11-08 VITALS — BP 120/78 | HR 91 | Wt 223.8 lb

## 2020-11-08 DIAGNOSIS — R519 Headache, unspecified: Secondary | ICD-10-CM

## 2020-11-08 DIAGNOSIS — O0990 Supervision of high risk pregnancy, unspecified, unspecified trimester: Secondary | ICD-10-CM | POA: Insufficient documentation

## 2020-11-08 DIAGNOSIS — Z362 Encounter for other antenatal screening follow-up: Secondary | ICD-10-CM | POA: Diagnosis not present

## 2020-11-08 DIAGNOSIS — O26892 Other specified pregnancy related conditions, second trimester: Secondary | ICD-10-CM

## 2020-11-08 NOTE — Progress Notes (Signed)
   PRENATAL VISIT NOTE  Subjective:  Katherine Maldonado is a 35 y.o. G1P0 at [redacted]w[redacted]d being seen today for ongoing prenatal care.  She is currently monitored for the following issues for this high-risk pregnancy and has Burning sensation of lower extremity; Supervision of high risk pregnancy, antepartum; AMA (advanced maternal age) primigravida 35+; and Headache in pregnancy, antepartum, second trimester on their problem list.  Patient reports headaches (has neurologist), burning in upper thrigh due to coccyx injury..  Contractions: Not present. Vag. Bleeding: None.  Movement: Present. Denies leaking of fluid.   The following portions of the patient's history were reviewed and updated as appropriate: allergies, current medications, past family history, past medical history, past social history, past surgical history and problem list.   Objective:   Vitals:   11/08/20 1059  BP: 120/78  Pulse: 91  Weight: 223 lb 12.8 oz (101.5 kg)    Fetal Status: Fetal Heart Rate (bpm): 155   Movement: Present     General:  Alert, oriented and cooperative. Patient is in no acute distress.  Skin: Skin is warm and dry. No rash noted.   Cardiovascular: Normal heart rate noted  Respiratory: Normal respiratory effort, no problems with respiration noted  Abdomen: Soft, gravid, appropriate for gestational age.  Pain/Pressure: Absent     Pelvic: Cervical exam deferred        Extremities: Normal range of motion.  Edema: None  Mental Status: Normal mood and affect. Normal behavior. Normal judgment and thought content.   Assessment and Plan:  Pregnancy: G1P0 at [redacted]w[redacted]d 1. Supervision of high risk pregnancy, antepartum Pt's partner declined CR testing Migraine treatment per her headache specialist.  PT for coccyx injury  2.  28 week labs and glucola at next visit.   Preterm labor symptoms and general obstetric precautions including but not limited to vaginal bleeding, contractions, leaking of fluid and fetal  movement were reviewed in detail with the patient. Please refer to After Visit Summary for other counseling recommendations.   Return in about 4 weeks (around 12/06/2020).  Future Appointments  Date Time Provider Department Center  11/24/2020 11:15 AM Benjaman Lobe [facility]  01/10/2021  2:45 PM [facility] NURSE [facility]  01/10/2021  3:00 PM [facility] US1 [facility]  04/25/2021 10:10 AM Drema Dallas, DO [facility] None    Elsie Lincoln, MD

## 2020-11-09 ENCOUNTER — Other Ambulatory Visit: Payer: Self-pay | Admitting: *Deleted

## 2020-11-09 DIAGNOSIS — O09512 Supervision of elderly primigravida, second trimester: Secondary | ICD-10-CM

## 2020-11-13 ENCOUNTER — Telehealth: Payer: Self-pay

## 2020-11-13 ENCOUNTER — Other Ambulatory Visit: Payer: Self-pay

## 2020-11-13 ENCOUNTER — Encounter (HOSPITAL_COMMUNITY): Payer: Self-pay | Admitting: Obstetrics & Gynecology

## 2020-11-13 ENCOUNTER — Inpatient Hospital Stay (HOSPITAL_COMMUNITY)
Admission: AD | Admit: 2020-11-13 | Discharge: 2020-11-13 | Disposition: A | Discharge status: Home / Self Care | Payer: Medicaid Other | Attending: Obstetrics & Gynecology | Admitting: Obstetrics & Gynecology

## 2020-11-13 DIAGNOSIS — Z3492 Encounter for supervision of normal pregnancy, unspecified, second trimester: Secondary | ICD-10-CM | POA: Diagnosis not present

## 2020-11-13 DIAGNOSIS — O368120 Decreased fetal movements, second trimester, not applicable or unspecified: Secondary | ICD-10-CM | POA: Diagnosis not present

## 2020-11-13 DIAGNOSIS — Z87891 Personal history of nicotine dependence: Secondary | ICD-10-CM | POA: Diagnosis not present

## 2020-11-13 DIAGNOSIS — O0990 Supervision of high risk pregnancy, unspecified, unspecified trimester: Secondary | ICD-10-CM

## 2020-11-13 DIAGNOSIS — Z3A23 23 weeks gestation of pregnancy: Secondary | ICD-10-CM | POA: Insufficient documentation

## 2020-11-13 LAB — URINALYSIS, ROUTINE W REFLEX MICROSCOPIC
Bilirubin Urine: NEGATIVE
Glucose, UA: 50 mg/dL — AB
Hgb urine dipstick: NEGATIVE
Ketones, ur: NEGATIVE mg/dL
Leukocytes,Ua: NEGATIVE
Nitrite: NEGATIVE
Protein, ur: NEGATIVE mg/dL
Specific Gravity, Urine: 1.004 — ABNORMAL LOW (ref 1.005–1.030)
pH: 6 (ref 5.0–8.0)

## 2020-11-13 NOTE — MAU Provider Note (Signed)
History     CSN: [id number]  Arrival date and time: 11/13/20 1425   Event Date/Time   First Provider Initiated Contact with Patient 11/13/20 1505      Chief Complaint  Patient presents with  . Decreased Fetal Movement   HPI Katherine Maldonado is a 35 y.o. G1P0 at [redacted]w[redacted]d who presents to MAU with chief complaint of decreased fetal movement. This is a new problem, onset yesterday 11/12/2020. Patient states she can typically trigger fetal movement all day long, but has only felt one movement today and one movement yesterday despite trying multiple interventions suggested by her friends including "eating baby's favorite foods" and consuming cold sugary drinks. She denies abdominal pain, vaginal bleeding, dysuria, fever or recent illness.   Patient endorses fetal movement on arrival to MAU.  Patient receives care with [facility] and her next appointment is 12/06/2020.  OB History    Gravida  1   Para      Term      Preterm      AB      Living        SAB      IAB      Ectopic      Multiple      Live Births              Past Medical History:  Diagnosis Date  . Anxiety   . Back pain   . Burning sensation of lower extremity    left thigh  . Migraines     Past Surgical History:  Procedure Laterality Date  . TOOTH EXTRACTION      Family History  Adopted: Yes  Problem Relation Age of Onset  . Thyroid disease Mother   . COPD Mother   . Diabetes Father   . Hypertension Father   . Thrombocytopenia Father     Social History   Tobacco Use  . Smoking status: Former Smoker    Types: Cigarettes  . Smokeless tobacco: Never Used  . Tobacco comment: when teenager  Vaping Use  . Vaping Use: Never used  Substance Use Topics  . Alcohol use: Not Currently    Comment: occasionally  . Drug use: Not Currently    Types: Marijuana    Comment: last used October 2021    Allergies:  Allergies  Allergen Reactions  . Latex Swelling and Rash  . Penicillins Rash and  Other (See Comments)    Did it involve swelling of the face/tongue/throat, SOB, or low BP? Unknown Did it involve sudden or severe rash/hives, skin peeling, or any reaction on the inside of your mouth or nose? Unknown Did you need to seek medical attention at a hospital or doctor's office? Unknown When did it last happen?childhood If all above answers are "NO", may proceed with cephalosporin use.   Childhood reaction     No medications prior to admission.    Review of Systems  Gastrointestinal: Negative for abdominal pain.  Genitourinary: Negative for vaginal bleeding.  Musculoskeletal: Negative for back pain.  All other systems reviewed and are negative.  Physical Exam   Blood pressure 118/68, pulse 94, temperature 98.3 F (36.8 C), temperature source Oral, resp. rate 18, last menstrual period 06/04/2020, SpO2 98 %.  Physical Exam Vitals and nursing note reviewed. Exam conducted with a chaperone present.  Constitutional:      General: She is not in acute distress.    Appearance: Normal appearance. She is not ill-appearing.  Cardiovascular:  Rate and Rhythm: Normal rate.     Pulses: Normal pulses.     Heart sounds: Normal heart sounds.  Pulmonary:     Effort: Pulmonary effort is normal.     Breath sounds: Normal breath sounds.  Abdominal:     Comments: Gravid  Skin:    Capillary Refill: Capillary refill takes less than 2 seconds.  Neurological:     Mental Status: She is alert and oriented to person, place, and time.  Psychiatric:        Mood and Affect: Mood normal.        Behavior: Behavior normal.        Thought Content: Thought content normal.        Judgment: Judgment normal.     MAU Course  Procedures  --Reactive tracing: baseline 145, mod var, + accels, no decels --Toco: quiet --Discussed expectations for timing of daily kick counts, typically not before 28 weeks. Reaffirmed MAU is always open for evaluation PRN  Patient Vitals for the past 24  hrs:  BP Temp Temp src Pulse Resp SpO2  11/13/20 1535 118/68 98.3 F (36.8 C) Oral 94 18 98 %  11/13/20 1437 (!) 142/87 98 F (36.7 C) Oral 96 16 98 %   Results for orders placed or performed during the hospital encounter of 11/13/20 (from the past 24 hour(s))  Urinalysis, Routine w reflex microscopic Urine, Clean Catch     Status: Abnormal   Collection Time: 11/13/20  3:12 PM  Result Value Ref Range   Color, Urine YELLOW YELLOW   APPearance CLEAR CLEAR   Specific Gravity, Urine 1.004 (L) 1.005 - 1.030   pH 6.0 5.0 - 8.0   Glucose, UA 50 (A) NEGATIVE mg/dL   Hgb urine dipstick NEGATIVE NEGATIVE   Bilirubin Urine NEGATIVE NEGATIVE   Ketones, ur NEGATIVE NEGATIVE mg/dL   Protein, ur NEGATIVE NEGATIVE mg/dL   Nitrite NEGATIVE NEGATIVE   Leukocytes,Ua NEGATIVE NEGATIVE   Assessment and Plan  --35 y.o. G1P0 at [redacted]w[redacted]d  --Reactive tracing --Patient endorsing fetal movement throughout evaluation in MAU --Discharge home in stable condition  Calvert Cantor, CNM 11/13/2020, 6:44 PM

## 2020-11-13 NOTE — Telephone Encounter (Signed)
Pt called and states  that she has decreased fetal movement since Sunday. Pt states only felt baby move x 1 today and x 1 yesterday. Pt denies any vaginal bleeding or pain to abd. Pt advised to be seen at MAU for evaluation. Pt states is on the way to MAU right now.   Will route to Dr Crissie Reese for review  Judeth Cornfield, RN  11/13/20.

## 2020-11-13 NOTE — Telephone Encounter (Signed)
Chart reviewed for nurse visit. Agree with plan of care.   Venora Maples, MD 11/13/2020 8:59 PM

## 2020-11-13 NOTE — MAU Note (Signed)
Katherine Maldonado is a 35 y.o. at [redacted]w[redacted]d here in MAU reporting: decreased fetal movement since Monday morning. Felt 1 movement yesterday and 1 today. Denies bleeding or LOF.  Onset of complaint: ongoing  Pain score: 0/10  Vitals:   11/13/20 1437  BP: (!) 142/87  Pulse: 96  Resp: 16  Temp: 98 F (36.7 C)  SpO2: 98%     FHT:164  Lab orders placed from triage: none

## 2020-11-13 NOTE — Discharge Instructions (Signed)

## 2020-11-24 ENCOUNTER — Ambulatory Visit: Payer: Medicaid Other | Admitting: Physical Therapy

## 2020-12-06 ENCOUNTER — Other Ambulatory Visit: Payer: Self-pay

## 2020-12-06 ENCOUNTER — Ambulatory Visit (INDEPENDENT_AMBULATORY_CARE_PROVIDER_SITE_OTHER): Payer: Medicaid Other | Admitting: Obstetrics and Gynecology

## 2020-12-06 ENCOUNTER — Encounter: Payer: Self-pay | Admitting: *Deleted

## 2020-12-06 ENCOUNTER — Encounter: Payer: Self-pay | Admitting: Obstetrics and Gynecology

## 2020-12-06 VITALS — BP 116/75 | HR 90 | Wt 228.1 lb

## 2020-12-06 DIAGNOSIS — O09512 Supervision of elderly primigravida, second trimester: Secondary | ICD-10-CM

## 2020-12-06 DIAGNOSIS — O0990 Supervision of high risk pregnancy, unspecified, unspecified trimester: Secondary | ICD-10-CM

## 2020-12-06 DIAGNOSIS — Z3009 Encounter for other general counseling and advice on contraception: Secondary | ICD-10-CM | POA: Insufficient documentation

## 2020-12-06 HISTORY — DX: Encounter for other general counseling and advice on contraception: Z30.09

## 2020-12-06 NOTE — Patient Instructions (Signed)

## 2020-12-06 NOTE — Progress Notes (Signed)
Subjective:  Katherine Maldonado is a 35 y.o. G1P0 at [redacted]w[redacted]d being seen today for ongoing prenatal care.  She is currently monitored for the following issues for this low-risk pregnancy and has Burning sensation of lower extremity; Supervision of high risk pregnancy, antepartum; AMA (advanced maternal age) primigravida 61+; Headache in pregnancy, antepartum, second trimester; and Unwanted fertility on their problem list.  Patient reports no complaints.  Contractions: Not present. Vag. Bleeding: None.  Movement: Present. Denies leaking of fluid.   The following portions of the patient's history were reviewed and updated as appropriate: allergies, current medications, past family history, past medical history, past social history, past surgical history and problem list. Problem list updated.  Objective:   Vitals:   12/06/20 1538  BP: 116/75  Pulse: 90  Weight: 228 lb 1.6 oz (103.5 kg)    Fetal Status: Fetal Heart Rate (bpm): 152   Movement: Present     General:  Alert, oriented and cooperative. Patient is in no acute distress.  Skin: Skin is warm and dry. No rash noted.   Cardiovascular: Normal heart rate noted  Respiratory: Normal respiratory effort, no problems with respiration noted  Abdomen: Soft, gravid, appropriate for gestational age. Pain/Pressure: Absent     Pelvic:  Cervical exam deferred        Extremities: Normal range of motion.  Edema: None  Mental Status: Normal mood and affect. Normal behavior. Normal judgment and thought content.   Urinalysis:      Assessment and Plan:  Pregnancy: G1P0 at [redacted]w[redacted]d  1. Supervision of high risk pregnancy, antepartum Stable Glucola scheduled  2. Primigravida of advanced maternal age in second trimester F/U growth scan scheduled  3. Unwanted fertility BTL paper signed today  Preterm labor symptoms and general obstetric precautions including but not limited to vaginal bleeding, contractions, leaking of fluid and fetal movement were  reviewed in detail with the patient. Please refer to After Visit Summary for other counseling recommendations.  Return in about 2 weeks (around 12/20/2020) for OB visit, face to face, any provider.   Hermina Staggers, MD

## 2020-12-18 ENCOUNTER — Other Ambulatory Visit: Payer: Self-pay | Admitting: General Practice

## 2020-12-18 DIAGNOSIS — O0990 Supervision of high risk pregnancy, unspecified, unspecified trimester: Secondary | ICD-10-CM

## 2020-12-21 ENCOUNTER — Other Ambulatory Visit: Payer: Self-pay

## 2020-12-21 ENCOUNTER — Ambulatory Visit (INDEPENDENT_AMBULATORY_CARE_PROVIDER_SITE_OTHER): Payer: Medicaid Other | Admitting: Obstetrics and Gynecology

## 2020-12-21 ENCOUNTER — Other Ambulatory Visit: Payer: Medicaid Other

## 2020-12-21 VITALS — BP 112/62 | HR 80 | Wt 226.4 lb

## 2020-12-21 DIAGNOSIS — O0990 Supervision of high risk pregnancy, unspecified, unspecified trimester: Secondary | ICD-10-CM

## 2020-12-21 DIAGNOSIS — Z3A29 29 weeks gestation of pregnancy: Secondary | ICD-10-CM

## 2020-12-21 DIAGNOSIS — O285 Abnormal chromosomal and genetic finding on antenatal screening of mother: Secondary | ICD-10-CM | POA: Insufficient documentation

## 2020-12-21 DIAGNOSIS — O26892 Other specified pregnancy related conditions, second trimester: Secondary | ICD-10-CM

## 2020-12-21 DIAGNOSIS — O99210 Obesity complicating pregnancy, unspecified trimester: Secondary | ICD-10-CM | POA: Insufficient documentation

## 2020-12-21 DIAGNOSIS — O09513 Supervision of elderly primigravida, third trimester: Secondary | ICD-10-CM

## 2020-12-21 DIAGNOSIS — Z3009 Encounter for other general counseling and advice on contraception: Secondary | ICD-10-CM

## 2020-12-21 DIAGNOSIS — E669 Obesity, unspecified: Secondary | ICD-10-CM

## 2020-12-21 DIAGNOSIS — R519 Headache, unspecified: Secondary | ICD-10-CM

## 2020-12-21 HISTORY — DX: Obesity complicating pregnancy, unspecified trimester: O99.210

## 2020-12-21 HISTORY — DX: Abnormal chromosomal and genetic finding on antenatal screening of mother: O28.5

## 2020-12-21 NOTE — Progress Notes (Signed)
     PRENATAL VISIT NOTE  Subjective:  Katherine Maldonado is a 35 y.o. G1P0 at [redacted]w[redacted]d being seen today for ongoing prenatal care.  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  Patient reports no complaints.  Contractions: Not present. Vag. Bleeding: None.  Movement: Present. Denies leaking of fluid.   The following portions of the patient's history were reviewed and updated as appropriate: allergies, current medications, past family history, past medical history, past social history, past surgical history and problem list.   Objective:   Vitals:   12/21/20 0826  BP: 112/62  Pulse: 80  Weight: 226 lb 6.4 oz (102.7 kg)    Fetal Status: Fetal Heart Rate (bpm): 152   Movement: Present     General:  Alert, oriented and cooperative. Patient is in no acute distress.  Skin: Skin is warm and dry. No rash noted.   Cardiovascular: Normal heart rate noted  Respiratory: Normal respiratory effort, no problems with respiration noted  Abdomen: Soft, gravid, appropriate for gestational age.  Pain/Pressure: Absent     Pelvic: Cervical exam deferred        Extremities: Normal range of motion.  Edema: None  Mental Status: Normal mood and affect. Normal behavior. Normal judgment and thought content.   Assessment and Plan:  Pregnancy: G1P0 at [redacted]w[redacted]d 1. Primigravida of advanced maternal age in third trimester No issues. LR cffdna. Has 6/30 u/s for growth  2. Supervision of high risk pregnancy, antepartum 28wk labs today  3. Unwanted fertility Btl papers signed 5/26 Pt still desires, no questions  4. Headache in pregnancy, antepartum, second  trimester Doing okay. Still trying to find OTC Mg  5. 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 Obesity (BMI 30-39.9)  7. Obesity in pregnancy  8. [redacted] weeks gestation of pregnancy  Preterm labor symptoms and general obstetric precautions including but not limited to vaginal bleeding, contractions, leaking of fluid and fetal movement were reviewed in detail with the patient. Please refer to After Visit Summary for other counseling recommendations.   Return in about 20 days (around 01/10/2021) for in person, low risk ob, md or app.  Future Appointments  Date Time Provider Department Center  12/21/2020 10:55 AM [place] Bing, MD [facility]  01/10/2021  2:45 PM [facility] NURSE [facility]  01/10/2021  3:00 PM [facility] US1 [facility]  04/25/2021 10:10 AM Drema Dallas, DO [facility] None    [place] Bing, MD

## 2020-12-22 LAB — GLUCOSE TOLERANCE, 2 HOURS W/ 1HR
Glucose, 1 hour: 200 mg/dL — ABNORMAL HIGH (ref 65–179)
Glucose, 2 hour: 190 mg/dL — ABNORMAL HIGH (ref 65–152)
Glucose, Fasting: 112 mg/dL — ABNORMAL HIGH (ref 65–91)

## 2020-12-22 LAB — CBC
Hematocrit: 34.2 % (ref 34.0–46.6)
Hemoglobin: 11.4 g/dL (ref 11.1–15.9)
MCH: 30 pg (ref 26.6–33.0)
MCHC: 33.3 g/dL (ref 31.5–35.7)
MCV: 90 fL (ref 79–97)
Platelets: 284 10*3/uL (ref 150–450)
RBC: 3.8 x10E6/uL (ref 3.77–5.28)
RDW: 13.1 % (ref 11.7–15.4)
WBC: 11.8 10*3/uL — ABNORMAL HIGH (ref 3.4–10.8)

## 2020-12-22 LAB — HIV ANTIBODY (ROUTINE TESTING W REFLEX): HIV Screen 4th Generation wRfx: NONREACTIVE

## 2020-12-22 LAB — RPR: RPR Ser Ql: NONREACTIVE

## 2020-12-24 ENCOUNTER — Telehealth: Payer: Self-pay | Admitting: Lactation Services

## 2020-12-24 ENCOUNTER — Encounter: Payer: Self-pay | Admitting: Obstetrics and Gynecology

## 2020-12-24 DIAGNOSIS — O24414 Gestational diabetes mellitus in pregnancy, insulin controlled: Secondary | ICD-10-CM | POA: Insufficient documentation

## 2020-12-24 MED ORDER — ACCU-CHEK GUIDE VI STRP: ORAL_STRIP | 12 refills | Status: DC

## 2020-12-24 MED ORDER — ACCU-CHEK GUIDE W/DEVICE KIT: 1.0000 | PACK | Freq: Once | 0 refills | Status: AC

## 2020-12-24 MED ORDER — ACCU-CHEK SOFTCLIX LANCETS MISC: 12 refills | Status: DC

## 2020-12-24 NOTE — Telephone Encounter (Signed)
Katherine Maldonado left a message she is returning a call.  I called her back and informed her she did not pass GDM screen and has GDM. I informed her we would like to schedule diabetes education asap. She states she wants the appt on 6/30 when she is here for ob visit and [place]. I offered 11:15 as pm appointments full and she has the 2 others. She refuses 11:15, states she doesn't want to take off morning from work. She states she is a one person income and will not get FMLA so she needs to work.  She states she will figure it out on her own. I explained it is important she get the education and we will see if we can come up with something that will work. Will send message to registrar, provider, and diabetes educator. Dameian Crisman,RN

## 2020-12-24 NOTE — Telephone Encounter (Signed)
-----   Message from [place] Bing, MD sent at 12/24/2020 11:19 AM EDT ----- Please let her know that she has GDM and set her up with teaching, supplies, etc.

## 2020-12-24 NOTE — Telephone Encounter (Signed)
Called patient in regards to 2 hour GTT results. Patient did not answer. LM for patient to call the office for results and to check message in My Chart.   Message to front office to set up for GDM Education.

## 2021-01-03 MED ORDER — FAMOTIDINE 20 MG PO TABS: 20.0000 mg | ORAL_TABLET | Freq: Two times a day (BID) | ORAL | 1 refills | Status: DC

## 2021-01-10 ENCOUNTER — Other Ambulatory Visit: Payer: Medicaid Other

## 2021-01-10 ENCOUNTER — Encounter: Payer: Medicaid Other | Attending: Obstetrics and Gynecology | Admitting: Registered"

## 2021-01-10 ENCOUNTER — Encounter: Payer: Self-pay | Admitting: Family Medicine

## 2021-01-10 ENCOUNTER — Other Ambulatory Visit: Payer: Self-pay | Admitting: Obstetrics and Gynecology

## 2021-01-10 ENCOUNTER — Encounter: Payer: Self-pay | Admitting: *Deleted

## 2021-01-10 ENCOUNTER — Ambulatory Visit: Payer: Medicaid Other | Admitting: *Deleted

## 2021-01-10 ENCOUNTER — Other Ambulatory Visit: Payer: Self-pay

## 2021-01-10 ENCOUNTER — Ambulatory Visit: Payer: Medicaid Other | Admitting: Registered"

## 2021-01-10 ENCOUNTER — Ambulatory Visit: Payer: Medicaid Other | Attending: Obstetrics and Gynecology

## 2021-01-10 ENCOUNTER — Ambulatory Visit (INDEPENDENT_AMBULATORY_CARE_PROVIDER_SITE_OTHER): Payer: Medicaid Other | Admitting: Obstetrics and Gynecology

## 2021-01-10 VITALS — BP 122/68 | HR 102

## 2021-01-10 VITALS — BP 126/85 | HR 102 | Wt 231.2 lb

## 2021-01-10 DIAGNOSIS — O24419 Gestational diabetes mellitus in pregnancy, unspecified control: Secondary | ICD-10-CM

## 2021-01-10 DIAGNOSIS — Z3A00 Weeks of gestation of pregnancy not specified: Secondary | ICD-10-CM | POA: Insufficient documentation

## 2021-01-10 DIAGNOSIS — O09513 Supervision of elderly primigravida, third trimester: Secondary | ICD-10-CM

## 2021-01-10 DIAGNOSIS — O0990 Supervision of high risk pregnancy, unspecified, unspecified trimester: Secondary | ICD-10-CM

## 2021-01-10 DIAGNOSIS — O09512 Supervision of elderly primigravida, second trimester: Secondary | ICD-10-CM | POA: Diagnosis present

## 2021-01-10 DIAGNOSIS — Z3A32 32 weeks gestation of pregnancy: Secondary | ICD-10-CM | POA: Insufficient documentation

## 2021-01-10 DIAGNOSIS — O24410 Gestational diabetes mellitus in pregnancy, diet controlled: Secondary | ICD-10-CM

## 2021-01-10 DIAGNOSIS — O285 Abnormal chromosomal and genetic finding on antenatal screening of mother: Secondary | ICD-10-CM

## 2021-01-10 NOTE — Progress Notes (Signed)
   PRENATAL VISIT NOTE  Subjective:  Katherine Maldonado is a 35 y.o. G1P0 at [redacted]w[redacted]d being seen today for ongoing prenatal care.  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  Patient doing well with no acute concerns today. She reports no complaints.  Contractions: Not present. Vag. Bleeding: None.  Movement: Present. Denies leaking of fluid.   The following portions of the patient's history were reviewed and updated as appropriate: allergies, current medications, past family history, past medical history, past social history, past surgical history and problem list. Problem list updated.  Objective:   Vitals:   01/10/21 1326  BP: 126/85  Pulse: (!) 102  Weight: 231 lb 3.2 oz (104.9 kg)    Fetal Status: Fetal Heart Rate (bpm): 159 Fundal Height: 32 cm Movement: Present     General:  Alert, oriented and cooperative. Patient is in no acute distress.  Skin: Skin is warm and dry. No rash noted.   Cardiovascular: Normal heart rate noted  Respiratory: Normal respiratory effort, no problems with respiration noted  Abdomen: Soft, gravid, appropriate for gestational age.  Pain/Pressure: Present     Pelvic: Cervical exam deferred        Extremities: Normal range of motion.  Edema: None  Mental Status:  Normal mood and affect. Normal behavior. Normal judgment and thought content.   Assessment and Plan:  Pregnancy: G1P0 at [redacted]w[redacted]d  1. Diet controlled gestational diabetes mellitus (GDM) in third trimester Pt is receiving diabetic teaching and growth scan today  2. [redacted] weeks  gestation of pregnancy   3. Supervision of high risk pregnancy, antepartum Continue routine care Pt has received glucose forms  4. 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 Primigravida of advanced maternal age in third trimester   Preterm labor symptoms and general obstetric precautions including but not limited to vaginal bleeding, contractions, leaking of fluid and fetal movement were reviewed in detail with the patient.  Please refer to After Visit Summary for other counseling recommendations.   Return in about 2 weeks (around 01/24/2021) for [facility], in person.   Mariel Aloe, MD Faculty Attending Center for [facility]

## 2021-01-10 NOTE — Progress Notes (Signed)
Patient was seen on 01/10/21 for Gestational Diabetes self-management. Estimated due date: 03/06/21; [redacted]w[redacted]d  Clinical: Medications: reviewed Medical History: GDM (current problem) Labs: OGTT all 3 values H, A1c 5.4% 08/23/20  Dietary and Lifestyle History: Pt states she knows she will have to be on medication because she cannot eat how she is supposed to. Pt states she goes all day without eating. Sometimes will feel like she is having low blood sugar (doesn't check) and to prevent migraine will drink an energy drink for the caffeine.   Pt states she rarely eats bread, doesn't keep it at home. Sometimes will eat a tortilla. Pt eats a variety of fruit, milk doesn't set well on stomach.  Pt states she has been craving ice. Pt last blood work 12/21/20 Hemoglobin & Hematocrit were WNL.  Pt states she does not drive and relies on Lyft or other people to give her rides. 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  Physical Activity: "Too hot to walk" Stress: not assessed Sleep:not assessed  24 hr Recall: Pt states she may only eat 1 meal per day First Meal: Snack: Second meal: [employer] hamburger & large coke Snack: Third meal: Snack: Beverages: 8-17 bottles of water  NUTRITION INTERVENTION  Nutrition education (E-1) on the following topics:   Initial Follow-up  []  []  Definition of Gestational Diabetes []  []  Why dietary management is important in controlling blood glucose [x]  []  Effects each nutrient has on blood glucose levels []  []  Simple carbohydrates vs complex carbohydrates []  []  Fluid intake []  []  Creating a balanced meal plan [x]  []  Carbohydrate counting  [x]  []  When to check blood glucose levels [x]  []  Proper blood glucose monitoring techniques [x]  []  Effect of stress and stress reduction techniques  [x]  []  Exercise effect on blood glucose levels, appropriate exercise during pregnancy []  []  Importance of limiting caffeine and abstaining from  alcohol and smoking []  []  Medications used for blood sugar control during pregnancy [x]  []  Hypoglycemia and rule of 15 [x]  []  Postpartum self care  Patient already has a meter, was waiting until this visit to learn how to use meter  CBG: 165 mg/dL   Patient instructed to monitor glucose levels: FBS: 60 - ? 95 mg/dL (some clinics use 90 for cutoff) 1 hour: ? 140 mg/dL 2 hour: ? mg/dL  Patient received handouts: Nutrition Diabetes and Pregnancy Carbohydrate Counting List  Patient will be seen for follow-up as needed.

## 2021-01-24 ENCOUNTER — Other Ambulatory Visit: Payer: Self-pay

## 2021-01-24 ENCOUNTER — Ambulatory Visit (INDEPENDENT_AMBULATORY_CARE_PROVIDER_SITE_OTHER): Payer: Medicaid Other | Admitting: Obstetrics & Gynecology

## 2021-01-24 ENCOUNTER — Other Ambulatory Visit: Payer: Self-pay | Admitting: Obstetrics and Gynecology

## 2021-01-24 VITALS — BP 134/84 | HR 84 | Wt 239.3 lb

## 2021-01-24 DIAGNOSIS — O0990 Supervision of high risk pregnancy, unspecified, unspecified trimester: Secondary | ICD-10-CM

## 2021-01-24 DIAGNOSIS — Z3A34 34 weeks gestation of pregnancy: Secondary | ICD-10-CM

## 2021-01-24 DIAGNOSIS — O24414 Gestational diabetes mellitus in pregnancy, insulin controlled: Secondary | ICD-10-CM

## 2021-01-24 MED ORDER — INSULIN NPH (HUMAN) (ISOPHANE) 100 UNIT/ML ~~LOC~~ SUSP: SUBCUTANEOUS | 11 refills | Status: DC

## 2021-01-24 MED ORDER — INSULIN ASPART 100 UNIT/ML FLEXPEN: PEN_INJECTOR | SUBCUTANEOUS | 11 refills | Status: DC

## 2021-01-24 NOTE — Patient Instructions (Signed)
Insulin Treatment for Diabetes Mellitus Diabetes, also known as diabetes mellitus, is a long-term (chronic) disease. It occurs when the body does not properly use sugar (glucose). Glucose levels are controlled by a hormone called insulin. Insulin is made in the pancreas, which is an organ behind the stomach. In type 1 diabetes, the pancreas does not make insulin. In type 2 diabetes, the body does not use or respond to insulin properly (called insulin resistance). Also, in some people, the pancreas does not make enough insulin. Treatment plans for diabetes vary. They are unique for each person. Treatment plans depend on: The type of diabetes. The treatment goals. Your medical history. People with type 1 diabetes and some with type 2 diabetes will need to take insulin as part of their treatment plan. Ask questions to understand yourinsulin treatment so you can be active in managing your diabetes. Types of insulin Insulin molecules are delicate. Insulin is destroyed by enzymes in the stomach or intestine. For this reason, insulin is not given in pill form. It is eitherinjected under the skin or inhaled into the lungs. You may use more than one type of insulin. The different types of insulin are described below. It is important to know the onset, peak, and duration of the type of insulin you take. The onset is when it starts lowering blood glucose.The peak is when it works the strongest. The duration is how long it works. Insulin comes in different strengths. The most common strength is U-100, or 100 units per 1 mL of insulin. It is important to make sure you are using the rightstrength of insulin with the right syringe. Rapid-acting insulin: Onset: Within 15 minutes. Peak: About 1-2 hours. Duration: 2-4 hours. Works well when taken right before a meal to quickly lower your blood glucose. This type of insulin is available as an injection and an inhaler. You and your health care provider will decide if  an injection or inhaler is best for you. Short-acting insulin: Onset: Within 30 minutes. Peak: 2-3 hours. Duration: 3-6 hours. Should be taken about 30 minutes before you start eating a meal. Intermediate-acting insulin: Onset: Within 1.5-4 hours. Peak: 4-12 hours. Duration: 12-18 hours. Lowers your blood glucose for a longer period of time. However, it does not work as well for lowering blood glucose right after a meal. Usually used 1-2 times per day. Long-acting insulin: Mimics the small amount of insulin that your pancreas usually makes throughout the day. Onset: Within 2 hours. Peak: There is no peak. Long-acting insulins lower blood glucose levels evenly throughout the day. Duration: At least 24 hours. Long-acting insulins should be used one or two times a day. Concentrated insulin: Concentrated insulins are available in higher concentrations than U-100 insulins. Concentrated insulins are helpful for people who require high doses of insulin, usually more than 100 units per day. Concentrated insulins deliver the same amount of insulin but in a smaller volume. Concentrated insulins are available as: Humulin (Regular insulin U-500) has 500 units per 1 mL of insulin. This insulin should only be used only with the U-500 syringe or U-500 insulin pen. Do not use another type of syringe with this insulin. The wrong type of syringe can cause serious problems such as low blood glucose. Humalog (Insulin Lispro, U-200) and Tresiba (Insulin degludec, U-200) have 200 units per 1 mL of insulin. These insulins are only available as a U-200 pen. Toujeo (Insulin glargine, U-300) has 300 units per 1 mL of insulin. This insulin is only available as a  only available as a U-300 pen. Common terms related to insulin treatment: Some terms that you might hear include: Basal insulin or background insulin This is the constant amount of insulin that keeps your blood glucose levels stable when you are not eating. People who have  type 1 diabetes need basal insulin in a nonstop (continuous) or steady dose 24 hours a day. People with type 2 diabetes may also get basal insulin. Usually, intermediate-acting or long-acting insulin is used one or two times a day to manage glucose levels. Bolus insulin This refers to meal-related insulin (prandial insulin). Blood glucose rises quickly after a meal (postprandial). Rapid-acting or short-acting insulin can be used before a meal (preprandial) to help control blood glucose after the meal. You may be told to adjust the amount of bolus insulin that you take based on how much carbohydrate is in your meal. Correction insulin This may also be called a correction dose or supplemental dose. This is a small amount of rapid-acting or short-acting insulin that can be used to lower your blood glucose if it is too high. You may be told to check your blood glucose at certain times of the day and use correction insulin as needed. Tight control, intensive therapy, or basal-bolus insulin therapy These terms are used for insulin plans that keep your blood glucose as close to your target as possible. They prevent your blood glucose from getting too high at any time of day, but especially after meals. People who have tight control of their diabetes have fewer long-term problems caused by diabetes. Insulins are also available as mixtures of basal and bolus insulins. Using a premixed insulin decreases the number of injections you might need everyday. Talk to your health care provider to see if an insulin mixture is right for you. What are the risks? Possible side effects of insulin treatment include: Low blood glucose (hypoglycemia). Weight gain. Bruising or irritation at the injection site. Some of these side effects can be caused by incorrect insulin doses and improper injection technique. Be sure to learn how to inject insulin properly. Supplies needed: Soap and water. Insulin. Insulin syringe or  insulin pen needles. Alcohol wipes. Blood glucose meter. Blood glucose test strips and lancets. A disposal container for sharp items (sharps container), such as an empty plastic bottle with a cover. How to use insulin Most often, insulin is given through an injection. It is injected using a syringe and needle, an insulin pen, a pump, or a jet injector. Your health care provider will: Prescribe the type and amount of insulin that you need. Tell you when you should inject your insulin. Usually, you will give yourself insulin injections. Other people can also be taught how to give you injections. You will use a type of syringe that is made only for insulin. Some people may have an insulin pump that delivers insulin steadily through a tube (cannula) that is placed under the skin. Insulin is also available in an inhaled form. An inhaler delivers bolus insulin doses. Inhaled insulin does not require injections, but most people will still need to use basal insulin that is injected. Injection sites Insulin is injected into a layer of fatty tissue under your skin. Good places to inject insulin include: Abdomen. Generally, the abdomen is the best place to inject insulin. However, you should avoid any area that is less than 2 inches (5 cm) from your belly button. Front of thigh. Upper, outer side of thigh. Upper, outer side of arm. Upper, outer part   of buttock. It is important to: Give your injection in a slightly different place each time. This helps to prevent irritation and improve absorption. Avoid injecting into areas that have scar tissue. Follow these instructions at home: Eating and drinking Talk with your health care provider or pharmacist about the type of insulin you should take and when you should take it. You should know when your insulin peaksand how long it lasts. You need this information to plan mealtimes and exercise. Follow instructions from your health care provider about a healthy  meal plan. Do not skip meals. Drink enough fluid to keep your urine pale yellow. Follow your sick day plan whenever you cannot eat or drink normally. Make this plan in advance with your health care provider. Lifestyle Work with your health care provider to manage your weight, blood pressure, cholesterol, and stress. Exercise regularly. Avoid drinking alcohol. Do not use any products that contain nicotine or tobacco. These products include cigarettes, chewing tobacco, and vaping devices, such as e-cigarettes. If you need help quitting, ask your health care provider. General instructions  Check your blood glucose as told. Your health care provider will tell you how often and when you should check your blood glucose. Your health care provider will also tell you what your glucose levels should be. Make sure to check your blood glucose before and after you exercise. If you exercise longer or in a different way than usual, check your blood glucose more often. Make sure you know the symptoms of high and low blood sugar and how to treat these. Take over-the-counter and prescription medicines only as told by your health care provider. Carry a medical alert card or wear medical alert jewelry. Keep all follow-up visits. This is important. Summary Diabetes is a long-term disease. It occurs when the body does not properly use glucose. Glucose levels are controlled by a hormone called insulin. Insulin treatment varies depending on your type of diabetes, your treatment goals, and your medical history. Talk with your health care provider or pharmacist about the type of insulin you should take and when you should take it. Check your blood glucose as told by your health care provider. Your health care provider will tell you how often and when you should check your blood glucose, and what your glucose levels should be. Know the symptoms of high and low blood glucose and how to treat them. This information is not  intended to replace advice given to you by your health care provider. Make sure you discuss any questions you have with your health care provider. Document Revised: 05/02/2020 Document Reviewed: 05/02/2020 Elsevier Patient Education  2022 Elsevier Inc.   

## 2021-01-24 NOTE — Progress Notes (Signed)
   PRENATAL VISIT NOTE  Subjective:  Katherine Maldonado is a 35 y.o. G1P0 at [redacted]w[redacted]d being seen today for ongoing prenatal care.  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  Patient reports no complaints.  Contractions: Not present. Vag. Bleeding: None.  Movement: Present. Denies leaking of fluid.   The following portions of the patient's history were reviewed and updated as appropriate: allergies, current medications, past family history, past medical history, past social history, past surgical history and problem list.   Objective:   Vitals:   01/24/21 0944  BP: 134/84  Pulse: 84  Weight: 239 lb 4.8 oz (108.5 kg)    Fetal Status: Fetal Heart Rate (bpm): 154   Movement: Present     General:  Alert, oriented and cooperative. Patient is in no acute distress.  Skin: Skin is warm and dry. No rash noted.   Cardiovascular: Normal heart rate noted  Respiratory: Normal respiratory effort, no problems with respiration noted  Abdomen: Soft, gravid, appropriate for gestational age.  Pain/Pressure: Present     Pelvic: Cervical exam deferred        Extremities: Normal range of motion.  Edema: None  Mental Status: Normal mood and affect. Normal behavior. Normal judgment and thought content.   Assessment and Plan:  Pregnancy: G1P0 at [redacted]w[redacted]d 1. Insulin controlled gestational diabetes mellitus (GDM) in third trimester 2. [redacted] weeks gestation of pregnancy 3. Supervision of high risk pregnancy, antepartum [place] 115-154, PP B 130-222, D 160-200. Discussed need for medication, recommended insulin vs  Metformin. She desires insulin which was heavily recommended given her hyperglycemia.  Wanted less doses/day so I calculated split day dosing (AM/PM) as she struggles with even recording PP after lunch.  Calculated Initial Insulin Regimen  Breakfast : Rapid-acting or regular insulin 25 units [place] and NPH insulin 55 units [place] .  Dinner : Rapid-acting or regular insulin 20 units [place].  Hour of Sleep (HS): NPH insulin 20 units [place] She will also start weekly antenatal testing. Next groeth scan is on 02/07/21. Discussed need for optimizing glycemic control to decrease DM associated maternal-fetal morbidity and mortality. Preterm labor symptoms and general obstetric precautions including but not limited to vaginal bleeding, contractions, leaking of fluid and fetal movement were reviewed in detail with the patient. Please refer to After Visit Summary for other counseling recommendations.   Return in about 1 week (around 01/31/2021) for OFFICE OB VISIT (MD only) and CBG review.  Future Appointments  Date Time Provider Department Center  01/25/2021 10:15 AM [facility] NST [facility]  01/29/2021  8:15 AM [facility] NST [facility]  02/07/2021  1:15 PM [facility] NURSE [facility]  02/07/2021  1:30 PM [facility] US3 [facility]  02/07/2021  3:15 PM [facility] NST [facility]  04/25/2021 10:10 AM Everlena Cooper, Adam R, DO [facility] None    Jaynie Collins, MD

## 2021-01-25 ENCOUNTER — Ambulatory Visit: Payer: Medicaid Other | Admitting: *Deleted

## 2021-01-25 ENCOUNTER — Ambulatory Visit (INDEPENDENT_AMBULATORY_CARE_PROVIDER_SITE_OTHER): Payer: Medicaid Other

## 2021-01-25 VITALS — BP 135/82 | HR 86

## 2021-01-25 DIAGNOSIS — O24414 Gestational diabetes mellitus in pregnancy, insulin controlled: Secondary | ICD-10-CM

## 2021-01-25 NOTE — Addendum Note (Signed)
Addended by: Isabell Jarvis on: 01/25/2021 10:42 AM   Modules accepted: Orders

## 2021-01-28 ENCOUNTER — Other Ambulatory Visit: Payer: Self-pay

## 2021-01-29 ENCOUNTER — Ambulatory Visit (INDEPENDENT_AMBULATORY_CARE_PROVIDER_SITE_OTHER): Payer: Medicaid Other | Admitting: General Practice

## 2021-01-29 ENCOUNTER — Ambulatory Visit (INDEPENDENT_AMBULATORY_CARE_PROVIDER_SITE_OTHER): Payer: Medicaid Other

## 2021-01-29 VITALS — BP 141/90 | HR 82

## 2021-01-29 DIAGNOSIS — O0990 Supervision of high risk pregnancy, unspecified, unspecified trimester: Secondary | ICD-10-CM

## 2021-01-29 DIAGNOSIS — O24414 Gestational diabetes mellitus in pregnancy, insulin controlled: Secondary | ICD-10-CM | POA: Diagnosis not present

## 2021-01-29 DIAGNOSIS — O09513 Supervision of elderly primigravida, third trimester: Secondary | ICD-10-CM

## 2021-01-29 DIAGNOSIS — Z3A34 34 weeks gestation of pregnancy: Secondary | ICD-10-CM

## 2021-01-29 DIAGNOSIS — R030 Elevated blood-pressure reading, without diagnosis of hypertension: Secondary | ICD-10-CM

## 2021-01-29 DIAGNOSIS — R21 Rash and other nonspecific skin eruption: Secondary | ICD-10-CM

## 2021-01-29 MED ORDER — "INSULIN SYRINGE 31G X 5/16"" 0.5 ML MISC": 1.0000 | Freq: Three times a day (TID) | 0 refills | Status: DC

## 2021-01-29 MED ORDER — ZINC OXIDE 20 % EX OINT: TOPICAL_OINTMENT | Freq: Two times a day (BID) | CUTANEOUS | 0 refills | Status: DC

## 2021-01-29 NOTE — Progress Notes (Signed)
Pt informed that the ultrasound is considered a limited OB ultrasound and is not intended to be a complete ultrasound exam.  Patient also informed that the ultrasound is not being completed with the intent of assessing for fetal or placental anomalies or any pelvic abnormalities.  Explained that the purpose of today's ultrasound is to assess for  BPP, presentation, and AFI.  Patient acknowledges the purpose of the exam and the limitations of the study.     Rosanna Bickle H RN BSN 01/29/21  

## 2021-01-29 NOTE — Progress Notes (Signed)
   Subjective:  Katherine Maldonado is a 35 y.o. G1P0 at [redacted]w[redacted]d being seen today for ongoing prenatal care.  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  Patient reports no complaints.  Contractions: Irritability.  .  Movement: Present. Denies leaking of fluid.   The following portions of the patient's history were reviewed and updated as appropriate: allergies, current medications, past family history, past medical history, past social history, past surgical history and problem list. Problem list updated.  Objective:   Vitals:   01/29/21 0850 01/29/21 0851  BP: (!) 143/87 (!) 141/90  Pulse: 82     Fetal Status: Fetal Heart Rate (bpm): NST   Movement: Present  Presentation: Vertex  General:  Alert, oriented and cooperative. Patient is in no acute distress.  Skin: Skin is warm and dry. No rash noted.   Cardiovascular: Normal heart rate noted  Respiratory: Normal respiratory effort, no problems with respiration noted  Abdomen: Soft, gravid, appropriate for gestational age.       Pelvic:       Cervical exam deferred        Extremities: Normal range of motion.     Mental Status: Normal mood and affect. Normal behavior. Normal judgment and thought content.   Urinalysis:      Assessment and Plan:  Pregnancy: G1P0 at [redacted]w[redacted]d  1. Insulin controlled gestational diabetes mellitus (GDM) in third trimester Still needs insulin teaching BPP 10/10 today - US FETAL BPP W/NONSTRESS; Future - Insulin Syringe-Needle U-100 (INSULIN SYRINGE .5CC/31GX5/16") 31G X 5/16" 0.5 ML MISC; 1 Device by Does not apply  route 3 (three) times daily.  Dispense: 100 each; Refill: 0  2. Supervision of high risk pregnancy, antepartum Patient here for testing only visit, provider asked to come evaluate given multiple issues BPP reportedly 10/10 BP elevated, see below NST reactive  3. Primigravida of advanced maternal age in third trimester   4. Elevated BP without diagnosis of hypertension Asymptomatic, denies headache, vision changes, chest pain, RUQ pain Endorses some LE edema Pregnancy hx reviewed, no prior elevated BP's - CBC - Comp Met (CMET) - Protein / creatinine ratio, urine  5. Rash Possibly fungal though difficult to tell Reports she has been cleaning with alcohol pads and aloe, recommended against alcohol pads as this is very irritating Rx cream as below, has follow up appt in 2 days, can reassess at that time - hydrocortisone 2.5%-nystatin-zinc oxide 20% 1:1:1 ointment mixture; Apply topically 2 (two) times daily.  Dispense: 240 g; Refill: 0  Preterm labor symptoms and general obstetric precautions including but not limited to vaginal bleeding, contractions, leaking of fluid and fetal movement were reviewed in detail with the patient. Please refer to After Visit Summary for other counseling recommendations.  No follow-ups on file.   Clarnce Flock, MD

## 2021-01-30 ENCOUNTER — Encounter: Payer: Self-pay | Admitting: Family Medicine

## 2021-01-30 DIAGNOSIS — O1413 Severe pre-eclampsia, third trimester: Secondary | ICD-10-CM

## 2021-01-30 DIAGNOSIS — O1403 Mild to moderate pre-eclampsia, third trimester: Secondary | ICD-10-CM | POA: Insufficient documentation

## 2021-01-30 HISTORY — DX: Severe pre-eclampsia, third trimester: O14.13

## 2021-01-30 LAB — COMPREHENSIVE METABOLIC PANEL
ALT: 15 IU/L (ref 0–32)
AST: 12 IU/L (ref 0–40)
Albumin/Globulin Ratio: 1.2 (ref 1.2–2.2)
Albumin: 3.3 g/dL — ABNORMAL LOW (ref 3.8–4.8)
Alkaline Phosphatase: 194 IU/L — ABNORMAL HIGH (ref 44–121)
BUN/Creatinine Ratio: 3 — ABNORMAL LOW (ref 9–23)
BUN: 2 mg/dL — ABNORMAL LOW (ref 6–20)
Bilirubin Total: 0.2 mg/dL (ref 0.0–1.2)
CO2: 21 mmol/L (ref 20–29)
Calcium: 9.3 mg/dL (ref 8.7–10.2)
Chloride: 107 mmol/L — ABNORMAL HIGH (ref 96–106)
Creatinine, Ser: 0.61 mg/dL (ref 0.57–1.00)
Globulin, Total: 2.8 g/dL (ref 1.5–4.5)
Glucose: 132 mg/dL — ABNORMAL HIGH (ref 65–99)
Potassium: 3.8 mmol/L (ref 3.5–5.2)
Sodium: 141 mmol/L (ref 134–144)
Total Protein: 6.1 g/dL (ref 6.0–8.5)
eGFR: 119 mL/min/{1.73_m2} (ref >59)

## 2021-01-30 LAB — CBC
Hematocrit: 36.1 % (ref 34.0–46.6)
Hemoglobin: 11.8 g/dL (ref 11.1–15.9)
MCH: 29.2 pg (ref 26.6–33.0)
MCHC: 32.7 g/dL (ref 31.5–35.7)
MCV: 89 fL (ref 79–97)
Platelets: 310 10*3/uL (ref 150–450)
RBC: 4.04 x10E6/uL (ref 3.77–5.28)
RDW: 13.2 % (ref 11.7–15.4)
WBC: 11.9 10*3/uL — ABNORMAL HIGH (ref 3.4–10.8)

## 2021-01-30 LAB — PROTEIN / CREATININE RATIO, URINE
Creatinine, Urine: 19 mg/dL
Protein, Ur: 12.9 mg/dL
Protein/Creat Ratio: 679 mg/g creat — ABNORMAL HIGH (ref 0–200)

## 2021-01-31 ENCOUNTER — Other Ambulatory Visit: Payer: Self-pay

## 2021-01-31 ENCOUNTER — Other Ambulatory Visit (HOSPITAL_COMMUNITY)
Admission: RE | Admit: 2021-01-31 | Discharge: 2021-01-31 | Disposition: A | Discharge status: Home / Self Care | Payer: Medicaid Other | Source: Ambulatory Visit | Attending: Family Medicine | Admitting: Family Medicine

## 2021-01-31 ENCOUNTER — Ambulatory Visit: Payer: Medicaid Other | Admitting: Registered"

## 2021-01-31 ENCOUNTER — Encounter: Payer: Medicaid Other | Attending: Obstetrics and Gynecology | Admitting: Registered"

## 2021-01-31 ENCOUNTER — Ambulatory Visit (INDEPENDENT_AMBULATORY_CARE_PROVIDER_SITE_OTHER): Payer: Medicaid Other | Admitting: Obstetrics and Gynecology

## 2021-01-31 ENCOUNTER — Encounter: Payer: Self-pay | Admitting: Obstetrics and Gynecology

## 2021-01-31 ENCOUNTER — Encounter: Payer: Medicaid Other | Admitting: Family Medicine

## 2021-01-31 VITALS — BP 129/85 | HR 85 | Wt 239.7 lb

## 2021-01-31 DIAGNOSIS — O24414 Gestational diabetes mellitus in pregnancy, insulin controlled: Secondary | ICD-10-CM

## 2021-01-31 DIAGNOSIS — Z3A00 Weeks of gestation of pregnancy not specified: Secondary | ICD-10-CM | POA: Diagnosis not present

## 2021-01-31 DIAGNOSIS — O24419 Gestational diabetes mellitus in pregnancy, unspecified control: Secondary | ICD-10-CM | POA: Diagnosis present

## 2021-01-31 DIAGNOSIS — O1403 Mild to moderate pre-eclampsia, third trimester: Secondary | ICD-10-CM

## 2021-01-31 DIAGNOSIS — O0990 Supervision of high risk pregnancy, unspecified, unspecified trimester: Secondary | ICD-10-CM

## 2021-01-31 MED ORDER — "INSULIN SYRINGE-NEEDLE U-100 30G X 15/64"" 1 ML MISC": 1.0000 | Freq: Three times a day (TID) | 0 refills | Status: DC

## 2021-01-31 NOTE — Progress Notes (Signed)
Patient does not have any questions or concerns  

## 2021-01-31 NOTE — Progress Notes (Signed)
   PRENATAL VISIT NOTE  Subjective:  Katherine Maldonado is a 35 y.o. G1P0 at [redacted]w[redacted]d being seen today for ongoing prenatal care.  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  Patient reports no complaints.  Contractions: Not present. Vag. Bleeding: None.  Movement: Present. Denies leaking of fluid.   The following portions of the patient's history were reviewed and updated as appropriate: allergies, current medications, past family history, past medical history, past social history, past surgical history and problem list.   Objective:   Vitals:   01/31/21 0851  BP: 129/85  Pulse: 85  Weight: 239 lb 11.2 oz (108.7 kg)    Fetal Status: Fetal Heart Rate (bpm): 152 Fundal Height: 40 cm Movement: Present     General:  Alert, oriented and cooperative. Patient is in no acute distress.  Skin: Skin is warm and dry. No rash noted.   Cardiovascular: Normal heart rate noted  Respiratory: Normal respiratory effort, no problems with respiration noted  Abdomen: Soft, gravid, appropriate for gestational age.  Pain/Pressure: Absent     Pelvic: Cervical exam performed in the presence of a chaperone Dilation: Closed Effacement (%): Thick Station: Ballotable  Extremities: Normal range of motion.  Edema: Trace  Mental Status: Normal mood and affect. Normal behavior. Normal judgment and thought content.   Assessment and Plan:  Pregnancy: G1P0 at [redacted]w[redacted]d 1. Supervision of high risk pregnancy,  antepartum Patient is doing well CUltures today - Cervicovaginal ancillary only( [place]) - Strep Gp B Culture+Rflx  2. Antepartum mild preeclampsia, third trimester Scheduled for IOL at 37 weeks  3. Gestational diabetes mellitus (GDM) requiring insulin Patient has not started insulin as she does not know how to administer CBGs all out of range 150-160 fasting and pp Will have diabetic educator teach insulin administration today  Preterm labor symptoms and general obstetric precautions including but not limited to vaginal bleeding, contractions, leaking of fluid and fetal movement were reviewed in detail with the patient. Please refer to After Visit Summary for other counseling recommendations.   Return in about 1 week (around 02/07/2021) for in person, ROB, High risk.  Future Appointments  Date Time Provider Department Center  02/05/2021 11:15 AM [facility]  02/07/2021  1:15 PM [facility] NURSE [facility]  02/07/2021  1:30 PM [facility] US3 [facility]  04/25/2021 10:10 AM Everlena Cooper, Rachelle Hora, DO [facility] None    Catalina Antigua, MD

## 2021-01-31 NOTE — Progress Notes (Signed)
Insulin Instruction  Patient was seen on 01/31/21 for insulin instruction.   MD orders are:  NPH: Br 55 units; 20 units qhs Novolog flex pen: Breakfast 25 units; Dinner 20 units  The following learning objectives were met by the patient during this visit:   Insulin Action of NPH and Rapid insulins  Reviewed syringe & vial including # units per syringe and vial  Hygiene and storage  Drawing up single using vials  Patient has experience using pens and verbally provided correction injection technique  Rotation of Sites  Hypoglycemia- symptoms, causes, treatment choices  Record keeping and MD follow up  Patient demonstrated understanding of insulin administration by return demonstration.  Patient received the following handouts: Insulin Instruction Handout                                        Patient to start on insulin as Rx'd by MD  Patient will be seen for follow-up as needed.

## 2021-01-31 NOTE — Addendum Note (Signed)
Addended byVidal Schwalbe on: 01/31/2021 10:54 AM   Modules accepted: Orders

## 2021-02-01 ENCOUNTER — Encounter: Payer: Self-pay | Admitting: *Deleted

## 2021-02-01 LAB — CERVICOVAGINAL ANCILLARY ONLY
Chlamydia: NEGATIVE
Comment: NEGATIVE
Comment: NORMAL
Neisseria Gonorrhea: NEGATIVE

## 2021-02-05 ENCOUNTER — Other Ambulatory Visit: Payer: Self-pay

## 2021-02-05 ENCOUNTER — Other Ambulatory Visit: Payer: Medicaid Other

## 2021-02-06 ENCOUNTER — Other Ambulatory Visit: Payer: Self-pay | Admitting: Advanced Practice Midwife

## 2021-02-06 ENCOUNTER — Encounter: Payer: Self-pay | Admitting: Obstetrics and Gynecology

## 2021-02-06 DIAGNOSIS — O99820 Streptococcus B carrier state complicating pregnancy: Secondary | ICD-10-CM | POA: Insufficient documentation

## 2021-02-06 HISTORY — DX: Streptococcus B carrier state complicating pregnancy: O99.820

## 2021-02-06 LAB — STREP GP B CULTURE+RFLX: Strep Gp B Culture+Rflx: POSITIVE — AB

## 2021-02-06 LAB — STREP GP B SUSCEPTIBILITY

## 2021-02-07 ENCOUNTER — Other Ambulatory Visit: Payer: Medicaid Other

## 2021-02-07 ENCOUNTER — Ambulatory Visit: Payer: Medicaid Other | Attending: Obstetrics and Gynecology

## 2021-02-07 ENCOUNTER — Other Ambulatory Visit: Payer: Self-pay

## 2021-02-07 ENCOUNTER — Encounter: Payer: Self-pay | Admitting: *Deleted

## 2021-02-07 ENCOUNTER — Encounter (HOSPITAL_COMMUNITY): Payer: Self-pay | Admitting: *Deleted

## 2021-02-07 ENCOUNTER — Telehealth (HOSPITAL_COMMUNITY): Payer: Self-pay | Admitting: *Deleted

## 2021-02-07 ENCOUNTER — Ambulatory Visit: Payer: Medicaid Other | Admitting: *Deleted

## 2021-02-07 VITALS — BP 120/90 | HR 99

## 2021-02-07 DIAGNOSIS — O99820 Streptococcus B carrier state complicating pregnancy: Secondary | ICD-10-CM | POA: Diagnosis present

## 2021-02-07 DIAGNOSIS — O09513 Supervision of elderly primigravida, third trimester: Secondary | ICD-10-CM | POA: Diagnosis present

## 2021-02-07 DIAGNOSIS — O0990 Supervision of high risk pregnancy, unspecified, unspecified trimester: Secondary | ICD-10-CM

## 2021-02-07 DIAGNOSIS — O24419 Gestational diabetes mellitus in pregnancy, unspecified control: Secondary | ICD-10-CM | POA: Diagnosis present

## 2021-02-07 DIAGNOSIS — O24414 Gestational diabetes mellitus in pregnancy, insulin controlled: Secondary | ICD-10-CM | POA: Insufficient documentation

## 2021-02-07 NOTE — Telephone Encounter (Signed)
Preadmission screen  

## 2021-02-11 ENCOUNTER — Other Ambulatory Visit: Payer: Self-pay | Admitting: Family Medicine

## 2021-02-11 LAB — SARS CORONAVIRUS 2 (TAT 6-24 HRS): SARS Coronavirus 2: NEGATIVE

## 2021-02-12 ENCOUNTER — Other Ambulatory Visit: Payer: Self-pay

## 2021-02-12 ENCOUNTER — Encounter: Payer: Self-pay | Admitting: Family Medicine

## 2021-02-12 ENCOUNTER — Ambulatory Visit (INDEPENDENT_AMBULATORY_CARE_PROVIDER_SITE_OTHER): Payer: Medicaid Other | Admitting: Family Medicine

## 2021-02-12 VITALS — BP 153/96 | HR 101 | Wt 240.0 lb

## 2021-02-12 DIAGNOSIS — Z3009 Encounter for other general counseling and advice on contraception: Secondary | ICD-10-CM

## 2021-02-12 DIAGNOSIS — O99820 Streptococcus B carrier state complicating pregnancy: Secondary | ICD-10-CM

## 2021-02-12 DIAGNOSIS — O1403 Mild to moderate pre-eclampsia, third trimester: Secondary | ICD-10-CM

## 2021-02-12 DIAGNOSIS — O0990 Supervision of high risk pregnancy, unspecified, unspecified trimester: Secondary | ICD-10-CM

## 2021-02-12 DIAGNOSIS — O99210 Obesity complicating pregnancy, unspecified trimester: Secondary | ICD-10-CM

## 2021-02-12 DIAGNOSIS — O24414 Gestational diabetes mellitus in pregnancy, insulin controlled: Secondary | ICD-10-CM

## 2021-02-12 DIAGNOSIS — O09513 Supervision of elderly primigravida, third trimester: Secondary | ICD-10-CM

## 2021-02-12 NOTE — Patient Instructions (Signed)

## 2021-02-12 NOTE — Progress Notes (Signed)
   Subjective:  MALKIA NIPPERT is a 35 y.o. G1P0 at [redacted]w[redacted]d being seen today for ongoing prenatal care.  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  Patient reports backache and carpal tunnel symptoms.  Contractions: Not present. Vag. Bleeding: None.  Movement: Present. Denies leaking of fluid.   The following portions of the patient's history were reviewed and updated as appropriate: allergies, current medications, past family history, past medical history, past social history, past surgical history and problem list. Problem list updated.  Objective:   Vitals:   02/12/21 1644  BP: (!) 153/96  Pulse: (!) 101  Weight: 240 lb (108.9 kg)    Fetal Status: Fetal Heart Rate (bpm): 155   Movement: Present     General:  Alert, oriented and cooperative. Patient is in no acute distress.  Skin: Skin is warm and dry. No rash noted.   Cardiovascular: Normal heart rate noted  Respiratory: Normal respiratory effort, no problems with respiration noted  Abdomen: Soft, gravid, appropriate for gestational age. Pain/Pressure: Absent     Pelvic: Vag. Bleeding: None     Cervical exam deferred        Extremities: Normal range of motion.  Edema: Trace  Mental Status: Normal mood and affect. Normal behavior. Normal judgment and thought content.   Urinalysis:      Assessment and Plan:  Pregnancy: G1P0 at [redacted]w[redacted]d  1. Supervision of high  risk pregnancy, antepartum BP elevated, see below FHR normal, good fetal movement IOL scheduled for tomorrow morning  2. Gestational diabetes mellitus (GDM) requiring insulin Not really checking sugars IOL tomorrow LGA 94%, EFW 3413g, on scan from 02/07/21  3. Antepartum mild preeclampsia, third trimester IOL tomorrow Otherwise asymptomatic  4. Primigravida of advanced maternal age in third trimester   5. Unwanted fertility BTL papers signed 12/06/20  6. Obesity in pregnancy   7. GBS (group B Streptococcus carrier), +RV culture, currently pregnant Penicillin labor  Preterm labor symptoms and general obstetric precautions including but not limited to vaginal bleeding, contractions, leaking of fluid and fetal movement were reviewed in detail with the patient. Please refer to After Visit Summary for other counseling recommendations.  Return in about 6 weeks (around 03/26/2021) for PP check.   Venora Maples, MD

## 2021-02-13 ENCOUNTER — Inpatient Hospital Stay (HOSPITAL_COMMUNITY)
Admission: AD | Admit: 2021-02-13 | Discharge: 2021-02-16 | DRG: 807 | Disposition: A | Discharge status: Home / Self Care | Payer: Medicaid Other | Attending: Obstetrics and Gynecology | Admitting: Obstetrics and Gynecology

## 2021-02-13 ENCOUNTER — Inpatient Hospital Stay (HOSPITAL_COMMUNITY): Payer: Medicaid Other

## 2021-02-13 ENCOUNTER — Encounter (HOSPITAL_COMMUNITY): Payer: Self-pay | Admitting: Obstetrics & Gynecology

## 2021-02-13 ENCOUNTER — Other Ambulatory Visit: Payer: Self-pay

## 2021-02-13 DIAGNOSIS — O24414 Gestational diabetes mellitus in pregnancy, insulin controlled: Secondary | ICD-10-CM | POA: Diagnosis present

## 2021-02-13 DIAGNOSIS — Z3490 Encounter for supervision of normal pregnancy, unspecified, unspecified trimester: Secondary | ICD-10-CM | POA: Diagnosis present

## 2021-02-13 DIAGNOSIS — Z3009 Encounter for other general counseling and advice on contraception: Secondary | ICD-10-CM | POA: Diagnosis present

## 2021-02-13 DIAGNOSIS — O3663X0 Maternal care for excessive fetal growth, third trimester, not applicable or unspecified: Secondary | ICD-10-CM | POA: Diagnosis present

## 2021-02-13 DIAGNOSIS — O1414 Severe pre-eclampsia complicating childbirth: Secondary | ICD-10-CM | POA: Diagnosis present

## 2021-02-13 DIAGNOSIS — Z87891 Personal history of nicotine dependence: Secondary | ICD-10-CM | POA: Diagnosis not present

## 2021-02-13 DIAGNOSIS — Z3A37 37 weeks gestation of pregnancy: Secondary | ICD-10-CM | POA: Diagnosis not present

## 2021-02-13 DIAGNOSIS — O285 Abnormal chromosomal and genetic finding on antenatal screening of mother: Secondary | ICD-10-CM | POA: Diagnosis present

## 2021-02-13 DIAGNOSIS — O24424 Gestational diabetes mellitus in childbirth, insulin controlled: Secondary | ICD-10-CM | POA: Diagnosis present

## 2021-02-13 DIAGNOSIS — O99820 Streptococcus B carrier state complicating pregnancy: Secondary | ICD-10-CM

## 2021-02-13 DIAGNOSIS — O99824 Streptococcus B carrier state complicating childbirth: Secondary | ICD-10-CM | POA: Diagnosis present

## 2021-02-13 DIAGNOSIS — O09519 Supervision of elderly primigravida, unspecified trimester: Secondary | ICD-10-CM

## 2021-02-13 DIAGNOSIS — Z880 Allergy status to penicillin: Secondary | ICD-10-CM | POA: Diagnosis not present

## 2021-02-13 DIAGNOSIS — O1404 Mild to moderate pre-eclampsia, complicating childbirth: Secondary | ICD-10-CM | POA: Diagnosis present

## 2021-02-13 DIAGNOSIS — O99214 Obesity complicating childbirth: Secondary | ICD-10-CM | POA: Diagnosis present

## 2021-02-13 DIAGNOSIS — Z141 Cystic fibrosis carrier: Secondary | ICD-10-CM

## 2021-02-13 DIAGNOSIS — O1403 Mild to moderate pre-eclampsia, third trimester: Secondary | ICD-10-CM | POA: Diagnosis present

## 2021-02-13 DIAGNOSIS — O1413 Severe pre-eclampsia, third trimester: Secondary | ICD-10-CM | POA: Diagnosis present

## 2021-02-13 HISTORY — DX: Encounter for supervision of normal pregnancy, unspecified, unspecified trimester: Z34.90

## 2021-02-13 LAB — COMPREHENSIVE METABOLIC PANEL
ALT: 12 U/L (ref 0–44)
AST: 21 U/L (ref 15–41)
Albumin: 2.3 g/dL — ABNORMAL LOW (ref 3.5–5.0)
Alkaline Phosphatase: 189 U/L — ABNORMAL HIGH (ref 38–126)
Anion gap: 10 (ref 5–15)
BUN: 7 mg/dL (ref 6–20)
CO2: 18 mmol/L — ABNORMAL LOW (ref 22–32)
Calcium: 8.8 mg/dL — ABNORMAL LOW (ref 8.9–10.3)
Chloride: 106 mmol/L (ref 98–111)
Creatinine, Ser: 0.62 mg/dL (ref 0.44–1.00)
GFR, Estimated: >60 mL/min (ref >60)
Glucose, Bld: 128 mg/dL — ABNORMAL HIGH (ref 70–99)
Potassium: 3.7 mmol/L (ref 3.5–5.1)
Sodium: 134 mmol/L — ABNORMAL LOW (ref 135–145)
Total Bilirubin: 0.6 mg/dL (ref 0.3–1.2)
Total Protein: 5.9 g/dL — ABNORMAL LOW (ref 6.5–8.1)

## 2021-02-13 LAB — CBC
HCT: 36.5 % (ref 36.0–46.0)
Hemoglobin: 11.7 g/dL — ABNORMAL LOW (ref 12.0–15.0)
MCH: 29.1 pg (ref 26.0–34.0)
MCHC: 32.1 g/dL (ref 30.0–36.0)
MCV: 90.8 fL (ref 80.0–100.0)
Platelets: 310 10*3/uL (ref 150–400)
RBC: 4.02 MIL/uL (ref 3.87–5.11)
RDW: 14.3 % (ref 11.5–15.5)
WBC: 11.5 10*3/uL — ABNORMAL HIGH (ref 4.0–10.5)
nRBC: 0 % (ref 0.0–0.2)

## 2021-02-13 LAB — TYPE AND SCREEN
ABO/RH(D): O POS
Antibody Screen: NEGATIVE

## 2021-02-13 LAB — GLUCOSE, CAPILLARY
Glucose-Capillary: 138 mg/dL — ABNORMAL HIGH (ref 70–99)
Glucose-Capillary: 99 mg/dL (ref 70–99)
Glucose-Capillary: 133 mg/dL — ABNORMAL HIGH (ref 70–99)

## 2021-02-13 LAB — PROTEIN / CREATININE RATIO, URINE
Creatinine, Urine: 62.94 mg/dL
Protein Creatinine Ratio: 0.37 mg/mg{Cre} — ABNORMAL HIGH (ref 0.00–0.15)
Total Protein, Urine: 23 mg/dL

## 2021-02-13 MED ORDER — FENTANYL CITRATE (PF) 100 MCG/2ML IJ SOLN
INTRAMUSCULAR | Status: AC
  Filled 2021-02-13: qty 2

## 2021-02-13 MED ORDER — OXYTOCIN-SODIUM CHLORIDE 30-0.9 UT/500ML-% IV SOLN: 2.5000 [IU]/h | INTRAVENOUS | Status: DC

## 2021-02-13 MED ORDER — SOD CITRATE-CITRIC ACID 500-334 MG/5ML PO SOLN
30.0000 mL | ORAL | Status: DC | PRN
  Administered 2021-02-14: 30 mL via ORAL
  Filled 2021-02-13: qty 30

## 2021-02-13 MED ORDER — OXYTOCIN BOLUS FROM INFUSION
333.0000 mL | Freq: Once | INTRAVENOUS | Status: AC
  Administered 2021-02-14: 333 mL via INTRAVENOUS

## 2021-02-13 MED ORDER — SODIUM CHLORIDE 0.9 % IV SOLN
12.5000 mg | Freq: Once | INTRAVENOUS | Status: AC
  Administered 2021-02-13: 12.5 mg via INTRAVENOUS
  Filled 2021-02-13 (×2): qty 0.5

## 2021-02-13 MED ORDER — INSULIN NPH (HUMAN) (ISOPHANE) 100 UNIT/ML ~~LOC~~ SUSP
55.0000 [IU] | Freq: Every day | SUBCUTANEOUS | Status: DC
  Filled 2021-02-13: qty 10

## 2021-02-13 MED ORDER — OXYCODONE-ACETAMINOPHEN 5-325 MG PO TABS: 1.0000 | ORAL_TABLET | ORAL | Status: DC | PRN

## 2021-02-13 MED ORDER — ONDANSETRON HCL 4 MG/2ML IJ SOLN
4.0000 mg | Freq: Four times a day (QID) | INTRAMUSCULAR | Status: DC | PRN
  Administered 2021-02-13: 4 mg via INTRAVENOUS
  Filled 2021-02-13: qty 2

## 2021-02-13 MED ORDER — VANCOMYCIN HCL IN DEXTROSE 1-5 GM/200ML-% IV SOLN
1000.0000 mg | Freq: Two times a day (BID) | INTRAVENOUS | Status: DC
  Administered 2021-02-13 – 2021-02-14 (×3): 1000 mg via INTRAVENOUS
  Filled 2021-02-13 (×3): qty 200

## 2021-02-13 MED ORDER — FENTANYL CITRATE (PF) 100 MCG/2ML IJ SOLN
100.0000 ug | INTRAMUSCULAR | Status: DC | PRN
  Administered 2021-02-13 – 2021-02-14 (×4): 100 ug via INTRAVENOUS
  Filled 2021-02-13 (×4): qty 2

## 2021-02-13 MED ORDER — MISOPROSTOL 25 MCG QUARTER TABLET
25.0000 ug | ORAL_TABLET | ORAL | Status: DC | PRN
  Administered 2021-02-13: 25 ug via VAGINAL

## 2021-02-13 MED ORDER — INSULIN ASPART 100 UNIT/ML IJ SOLN
0.0000 [IU] | INTRAMUSCULAR | Status: DC
  Administered 2021-02-13 – 2021-02-14 (×2): 2 [IU] via SUBCUTANEOUS
  Administered 2021-02-14 (×2): 1 [IU] via SUBCUTANEOUS
  Administered 2021-02-14: 2 [IU] via SUBCUTANEOUS

## 2021-02-13 MED ORDER — OXYCODONE-ACETAMINOPHEN 5-325 MG PO TABS: 2.0000 | ORAL_TABLET | ORAL | Status: DC | PRN

## 2021-02-13 MED ORDER — INSULIN NPH (HUMAN) (ISOPHANE) 100 UNIT/ML ~~LOC~~ SUSP
20.0000 [IU] | Freq: Every day | SUBCUTANEOUS | Status: DC
  Administered 2021-02-13: 20 [IU] via SUBCUTANEOUS
  Filled 2021-02-13: qty 10

## 2021-02-13 MED ORDER — LACTATED RINGERS IV SOLN: 500.0000 mL | INTRAVENOUS | Status: DC | PRN

## 2021-02-13 MED ORDER — LACTATED RINGERS IV SOLN: INTRAVENOUS | Status: DC

## 2021-02-13 MED ORDER — MISOPROSTOL 25 MCG QUARTER TABLET
ORAL_TABLET | ORAL | Status: AC
  Filled 2021-02-13: qty 1

## 2021-02-13 MED ORDER — TERBUTALINE SULFATE 1 MG/ML IJ SOLN: 0.2500 mg | Freq: Once | INTRAMUSCULAR | Status: DC | PRN

## 2021-02-13 MED ORDER — MISOPROSTOL 50MCG HALF TABLET: 50.0000 ug | ORAL_TABLET | ORAL | Status: DC | PRN

## 2021-02-13 MED ORDER — FENTANYL CITRATE (PF) 100 MCG/2ML IJ SOLN
50.0000 ug | Freq: Once | INTRAMUSCULAR | Status: AC
  Administered 2021-02-13: 50 ug via INTRAVENOUS

## 2021-02-13 MED ORDER — BUTORPHANOL TARTRATE 1 MG/ML IJ SOLN
2.0000 mg | Freq: Once | INTRAMUSCULAR | Status: AC
  Administered 2021-02-13: 2 mg via INTRAVENOUS
  Filled 2021-02-13: qty 2

## 2021-02-13 MED ORDER — LIDOCAINE HCL (PF) 1 % IJ SOLN: 30.0000 mL | INTRAMUSCULAR | Status: DC | PRN

## 2021-02-13 MED ORDER — ACETAMINOPHEN 325 MG PO TABS: 650.0000 mg | ORAL_TABLET | ORAL | Status: DC | PRN

## 2021-02-13 NOTE — Progress Notes (Addendum)
Katherine Maldonado is a 35 y.o. G1P0 at [redacted]w[redacted]d by ultrasound admitted for induction of labor due to Pre-E.  Subjective: Patient having more pain with contractions, breathing through them and using the labor ball.  Objective: BP (!) 165/100   Pulse 76   Temp 98 F (36.7 C) (Oral)   Resp 18   Ht 5\' 7"  (1.702 m)   Wt 108.6 kg   LMP 06/04/2020 (Approximate)   BMI 37.51 kg/m  No intake/output data recorded. No intake/output data recorded.  FHT:  FHR: 145 bpm, variability: moderate,  accelerations:  Present,  decelerations:  Absent UC:   regular, every 2 minutes SVE:   Dilation: Fingertip Effacement (%): Thick Station: -3 Exam by:: Ajeet Casasola MD  Labs: Lab Results  Component Value Date   WBC 11.5 (H) 02/13/2021   HGB 11.7 (L) 02/13/2021   HCT 36.5 02/13/2021   MCV 90.8 02/13/2021   PLT 310 02/13/2021    Assessment / Plan: Induction of labor due to Pre-E.  Labor:  Still thick and finger tip after 1 cytotec, will redose second one when able. Plan to add FB when able. Preeclampsia:  First severe pressure, 165/100. Will monitor closely and if second severe range, will treat with labetalol and Mg.  Fetal Wellbeing:  Category I Pain Control:  IV pain meds I/D:   GBS +, PCN allergy, Vanc on Anticipated MOD:  NSVD  04/15/2021 02/13/2021, 4:47 PM

## 2021-02-13 NOTE — Progress Notes (Signed)
Inpatient Diabetes Program Recommendations  AACE/ADA: New Consensus Statement on Inpatient Glycemic Control (2015)  Target Ranges:  Prepandial:   less than 140 mg/dL      Peak postprandial:   less than 180 mg/dL (1-2 hours)      Critically ill patients:  140 - 180 mg/dL   Lab Results  Component Value Date   GLUCAP 99 02/13/2021   HGBA1C 5.4 08/14/2020    Review of Glycemic Control Results for SOPHIRA, RUMLER (MRN [id number]) as of 02/13/2021 14:32  Ref. Range 02/13/2021 10:17 02/13/2021 14:26  Glucose-Capillary Latest Ref Range: 70 - 99 mg/dL 025 (H) 99   Diabetes history: GDM Outpatient Diabetes medications: NPH 55 units q AM and 20 units q HS, Novolog 25 units with breakfast and dinner and 20 units with supper Current orders for Inpatient glycemic control:  Novolog 0-14 units q 4 hours NPH 55 units q AM and 20 units with evening meal  Inpatient Diabetes Program Recommendations:    Recommend d/c of NPH insulin due to induction.  Consider IV insulin if 2 CBG's>120 mg/dL. Will follow.  Thanks,  Beryl Meager, RN, BC-ADM Inpatient Diabetes Coordinator Pager [phone]

## 2021-02-13 NOTE — Progress Notes (Signed)
Labor Progress Note Katherine Maldonado is a 35 y.o. G1P0 at [redacted]w[redacted]d presented for IOL-PreE S: Patient is resting comfortably, breathing through contractions. Denies HA, blurry vision, RUQ pain, edema, N/V/D.  O:  BP 139/85   Pulse 87   Temp 98.4 F (36.9 C) (Oral)   Resp 20   Ht 5\' 7"  (1.702 m)   Wt 108.6 kg   LMP 06/04/2020 (Approximate)   BMI 37.51 kg/m  EFM: baseline 135 BPM/+accels/-decels/mod variability Toco: contractions q72min Not tracing well   CVE: Dilation: Fingertip Effacement (%): 70 Cervical Position: Anterior Station: -2 Presentation: Vertex Exam by:: [email or web address], CNM   A&P: 35 y.o. G1P0 [redacted]w[redacted]d presenting for IOL-PreE #Labor: S/P cytotec, FB still in place. Await FB to dislodge and possibly attempt pit 2/2.  #Pain: Epidural as desired  #FWB: cat 1 #GBS positive>Vanc #PreE: Pt is asymptomatic. She had one severe range pressure-165/100. If she has another, will likely start medication/mag.  #A2GDM: On NPH 55 morning and 20 afternoon as well as SSI (received two units @1904 ). Recent CBG-133. Continue monitoring.   [redacted]w[redacted]d, MD Center for , [facility] 12:06 AM

## 2021-02-13 NOTE — H&P (Addendum)
OBSTETRIC ADMISSION HISTORY AND PHYSICAL  Katherine Maldonado is a 35 y.o. female G1P0 with IUP at [redacted]w[redacted]d by 6 wk US presenting for IOL due to pre-e. She reports +FMs, No LOF, no VB, no blurry vision, headaches or peripheral edema, and RUQ pain.  She plans on breast and bottle feeding. She requests BTL for birth control. She received her prenatal care at [facility]  Dating: By 6wk US --->  Estimated Date of Delivery: 03/06/21  Sono:    @[redacted]w[redacted]d, CWD, normal anatomy, cephalic presentation, anterior placental lie, 247g, 23% EFW  @36 weeks EFW 94% 7lbs 8oz    Prenatal History/Complications:  Pre-E with elevated Bp, without severe features  GBS pos CF carrier status AMA Obesity GDMA2 LGA  Past Medical History: Past Medical History:  Diagnosis Date   Anxiety    Back pain    Burning sensation of lower extremity    left thigh   Gestational diabetes    Migraines     Past Surgical History: Past Surgical History:  Procedure Laterality Date   TOOTH EXTRACTION      Obstetrical History: OB History     Gravida  1   Para      Term      Preterm      AB      Living         SAB      IAB      Ectopic      Multiple      Live Births              Social History Social History   Socioeconomic History   Marital status: Divorced    Spouse name: Not on file   Number of children: Not on file   Years of education: Not on file   Highest education level: Not on file  Occupational History   Not on file  Tobacco Use   Smoking status: Former    Types: Cigarettes   Smokeless tobacco: Never   Tobacco comments:    when teenager  Vaping Use   Vaping Use: Never used  Substance and Sexual Activity   Alcohol use: Not Currently    Comment: occasionally   Drug use: Not Currently    Types: Marijuana    Comment: last used October 2021   Sexual activity: Yes    Birth control/protection: None  Other Topics Concern   Not on file  Social History Narrative   Right handed    Drinks caffeine   One story home   Social Determinants of Health   Financial Resource Strain: Not on file  Food Insecurity: No Food Insecurity   Worried About Running Out of Food in the Last Year: Never true   Ran Out of Food in the Last Year: Never true  Transportation Needs: No Transportation Needs   Lack of Transportation (Medical): No   Lack of Transportation (Non-Medical): No  Physical Activity: Not on file  Stress: Not on file  Social Connections: Not on file    Family History: Family History  Adopted: Yes  Problem Relation Age of Onset   Thyroid disease Mother    COPD Mother    Diabetes Father    Hypertension Father    Thrombocytopenia Father     Allergies: Allergies  Allergen Reactions   Latex Swelling and Rash   Penicillins Rash and Other (See Comments)    Did it involve swelling of the face/tongue/throat, SOB, or low BP? Unknown Did it involve   sudden or severe rash/hives, skin peeling, or any reaction on the inside of your mouth or nose? Unknown Did you need to seek medical attention at a hospital or doctor's office? Unknown When did it last happen?  childhood     If all above answers are "NO", may proceed with cephalosporin use.   Childhood reaction     Medications Prior to Admission  Medication Sig Dispense Refill Last Dose   cyclobenzaprine (FLEXERIL) 10 MG tablet Take 1 tablet (10 mg total) by mouth every 8 (eight) hours as needed for muscle spasms. 30 tablet 1 02/12/2021   insulin aspart (NOVOLOG) 100 UNIT/ML FlexPen Breakfast: 25 units [place] and Dinner : 20 units [place] 15 mL 11 02/13/2021   insulin NPH Human (HUMULIN N) 100 UNIT/ML injection Breakfast : NPH insulin 55 units [place] and  Hour of Sleep (HS): NPH insulin 20 units [place] (Patient taking differently: Breakfast : NPH insulin 55 units [place] and  Hour of Sleep (HS): NPH insulin 20 units [place]) 10 mL 11 02/13/2021   Prenatal Vit-Fe Fumarate-FA (PRENATAL COMPLETE) 14-0.4 MG TABS Take 1 tablet daily 60 tablet 2 02/13/2021    Accu-Chek Softclix Lancets lancets To check blood sugars 4 times a day, fasting and 2 hours after the start of breakfast, lunch and dinner 100 each 12    acetaminophen (TYLENOL) 650 MG CR tablet Take 650 mg by mouth every 8 (eight) hours as needed for pain.   More than a month   Blood Glucose Monitoring Suppl (ACCU-CHEK GUIDE ME) w/Device KIT See admin instructions.      Blood Pressure Monitoring (BLOOD PRESSURE KIT) DEVI 1 Device by Does not apply route as needed. 1 each 0    Butalbital-APAP-Caffeine 50-325-40 MG capsule Take 1-2 capsules by mouth every 6 (six) hours as needed for headache. (Patient not taking: No sig reported) 30 capsule 1    famotidine (PEPCID) 20 MG tablet Take 1 tablet (20 mg total) by mouth 2 (two) times daily. 30 tablet 1    glucose blood (ACCU-CHEK GUIDE) test strip To check blood sugars 4 times a day, fasting and 2 hours after the start of breakfast, lunch and dinner. 100 each 12    hydrocortisone 2.5%-nystatin-zinc oxide 20% 1:1:1 ointment mixture Apply topically 2 (two) times daily. 240 g 0    Insulin Syringe-Needle U-100 30G X 15/64" 1 ML MISC 1 Device by Does not apply route 3 (three) times daily. 100 each 0    ondansetron (ZOFRAN) 4 MG tablet Take 4 mg by mouth every 8 (eight) hours as needed for nausea or vomiting.   More than a month     Review of Systems   All systems reviewed and negative except as stated in HPI  Height 5' 7" (1.702 m), weight 108.6 kg, last menstrual period 06/04/2020. General appearance: alert, cooperative, appears stated age, no distress, and moderately obese Lungs: clear to auscultation bilaterally Heart: regular rate and rhythm Abdomen: soft, non-tender; bowel sounds normal Pelvic: normal external genitalia; CE- fingertip, thick, ballotable Extremities: Homans sign is negative, no sign of DVT DTR's 2+ Presentation: cephalic Fetal monitoringBaseline: 150 bpm, Variability: Good {> 6 bpm), Accelerations: Reactive, and Decelerations:  Absent Uterine activityNone     Prenatal labs: ABO, Rh: O/Positive/-- (02/01 1542) Antibody: Negative (02/01 1542) Rubella: 3.05 (02/01 1542) RPR: Non Reactive (06/10 0918)  HBsAg: Negative (02/01 1542)  HIV: Non Reactive (06/10 0918)  GBS: Positive/-- (07/21 0929)  1 hr Glucola failed, GDMA2 Genetic screening  + CF carrier, FOB not  tested, Horizon WNL Anatomy US WNL  Prenatal Transfer Tool  Maternal Diabetes: Yes:  Diabetes Type:  Insulin/Medication controlled Genetic Screening: Abnormal:  Results: Other: Cf carrier Maternal Ultrasounds/Referrals: Normal Fetal Ultrasounds or other Referrals:  None Maternal Substance Abuse:  No Significant Maternal Medications:  Meds include: Other:  DM Significant Maternal Lab Results: Group B Strep positive PCN allergy  Results for orders placed or performed during the hospital encounter of 02/13/21 (from the past 24 hour(s))  Glucose, capillary   Collection Time: 02/13/21 10:17 AM  Result Value Ref Range   Glucose-Capillary 138 (H) 70 - 99 mg/dL    Patient Active Problem List   Diagnosis Date Noted   Encounter for induction of labor 02/13/2021   GBS (group B Streptococcus carrier), +RV culture, currently pregnant 02/06/2021   Antepartum mild preeclampsia, third trimester 01/30/2021   Elevated BP without diagnosis of hypertension 01/29/2021   Gestational diabetes mellitus (GDM) requiring insulin 12/24/2020   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 G1P0 at 94w0dhere for IOL due to Pre-eclampsia without severe features.  #Labor: Will start with  cytotec and will progress to FB and pitocin as able.  #Pre-e: Patient has had elevated BP's to 153/101, no severe range yet, will monitor closely. UPCR on 02/07/21 was 0.648. Will obtain CBC, CMP today. Denies HA, vision changes, peripheral edema, RUQ pain.  #GDMA2: Patient diagnosed on 6/10, treated with insulin starting 01/24/21. Currently on NPH 55 qAM, 20 qPM, also on novolog 25 qAM, 20 qPM. Initial CBG 138 (recently post-prandial). Continue NPH and add sliding scale for now. If worse CBGs, will consider endotool. Will do q4h CBG during latent phases of labor. Last [place] 02[place]07[place]21 has EFW at 94th percentile 3413g, HC<AC ratio 0.92. Hadlock growth calculator predicts EFW 3424g on 8/3.  #Pap: will need postpartum pap  #AMA: normal horizon, CF carrier, FOB not tested.  #Pain: prn #FWB: Cat 1 #ID:  GBS positive, PCN allergy (unclear reaction but patient high severity), clindamycin resistant, will treat with vancomycin. #MOF: Breast and bottle #MOC: BTL, papers signed 12/06/20 #Circ:  N/a   CGladys Damme MD  02/13/2021, 10:41 AM  GME ATTESTATION:  I saw and evaluated the patient. I agree with the findings and the plan of care as documented in the resident's note.  SDarrelyn Hillock DO OB Fellow, [place]8/09/2020 6:39 PM

## 2021-02-13 NOTE — Progress Notes (Signed)
Patient ID: DELA SWEENY, female   DOB: 12/05/1985, 35 y.o.   MRN: [id number]  S/p cytotec x 1 dose today; feeling well now; has been receiving NPH insulin + one dose of SSI for elevated value; has had one severe range BP with nl on repeat; asymptomatic for pre-e; has rec'd Vanc x 1 for GBS ppx  BP 155/93, P 74 FHR 130s, +accels, no decels Ctx irreg, mild Cx ant FT/70/vtx -2  CBGs today: 133, 99, 138  IUP@37 .0wks IOL process GDMA2- insulin Pre-e w/o SF  Cervical foley placed without difficulty; sm-mod bloody show w exam Plan for Pit when foley dislodges Anticipate vag del  Arabella Merles CNM 02/13/2021 8:01 PM

## 2021-02-14 ENCOUNTER — Inpatient Hospital Stay (HOSPITAL_COMMUNITY): Payer: Medicaid Other | Admitting: Anesthesiology

## 2021-02-14 ENCOUNTER — Encounter (HOSPITAL_COMMUNITY): Payer: Self-pay | Admitting: Obstetrics and Gynecology

## 2021-02-14 DIAGNOSIS — O1414 Severe pre-eclampsia complicating childbirth: Secondary | ICD-10-CM

## 2021-02-14 DIAGNOSIS — O3663X0 Maternal care for excessive fetal growth, third trimester, not applicable or unspecified: Secondary | ICD-10-CM

## 2021-02-14 DIAGNOSIS — O24424 Gestational diabetes mellitus in childbirth, insulin controlled: Secondary | ICD-10-CM

## 2021-02-14 DIAGNOSIS — Z3A37 37 weeks gestation of pregnancy: Secondary | ICD-10-CM

## 2021-02-14 DIAGNOSIS — O99820 Streptococcus B carrier state complicating pregnancy: Secondary | ICD-10-CM

## 2021-02-14 LAB — CBC
HCT: 36.3 % (ref 36.0–46.0)
Hemoglobin: 12 g/dL (ref 12.0–15.0)
MCH: 29.3 pg (ref 26.0–34.0)
MCHC: 33.1 g/dL (ref 30.0–36.0)
MCV: 88.5 fL (ref 80.0–100.0)
Platelets: 317 10*3/uL (ref 150–400)
RBC: 4.1 MIL/uL (ref 3.87–5.11)
RDW: 14.5 % (ref 11.5–15.5)
WBC: 17.9 10*3/uL — ABNORMAL HIGH (ref 4.0–10.5)
nRBC: 0 % (ref 0.0–0.2)

## 2021-02-14 LAB — GLUCOSE, CAPILLARY
Glucose-Capillary: 105 mg/dL — ABNORMAL HIGH (ref 70–99)
Glucose-Capillary: 121 mg/dL — ABNORMAL HIGH (ref 70–99)
Glucose-Capillary: 156 mg/dL — ABNORMAL HIGH (ref 70–99)
Glucose-Capillary: 67 mg/dL — ABNORMAL LOW (ref 70–99)
Glucose-Capillary: 73 mg/dL (ref 70–99)
Glucose-Capillary: 84 mg/dL (ref 70–99)
Glucose-Capillary: 91 mg/dL (ref 70–99)

## 2021-02-14 LAB — RPR: RPR Ser Ql: NONREACTIVE

## 2021-02-14 MED ORDER — FENTANYL-BUPIVACAINE-NACL 0.5-0.125-0.9 MG/250ML-% EP SOLN
12.0000 mL/h | EPIDURAL | Status: DC | PRN
Start: 2021-02-14 — End: 2021-02-15
  Administered 2021-02-14: 12 mL/h via EPIDURAL
  Filled 2021-02-14: qty 250

## 2021-02-14 MED ORDER — PHENYLEPHRINE 40 MCG/ML (10ML) SYRINGE FOR IV PUSH (FOR BLOOD PRESSURE SUPPORT): 80.0000 ug | PREFILLED_SYRINGE | INTRAVENOUS | Status: DC | PRN

## 2021-02-14 MED ORDER — TRANEXAMIC ACID-NACL 1000-0.7 MG/100ML-% IV SOLN
INTRAVENOUS | Status: AC
  Filled 2021-02-14: qty 100

## 2021-02-14 MED ORDER — TERBUTALINE SULFATE 1 MG/ML IJ SOLN: 0.2500 mg | Freq: Once | INTRAMUSCULAR | Status: DC | PRN

## 2021-02-14 MED ORDER — TRANEXAMIC ACID-NACL 1000-0.7 MG/100ML-% IV SOLN: 1000.0000 mg | INTRAVENOUS | Status: DC

## 2021-02-14 MED ORDER — EPHEDRINE 5 MG/ML INJ: 10.0000 mg | INTRAVENOUS | Status: DC | PRN

## 2021-02-14 MED ORDER — INSULIN NPH (HUMAN) (ISOPHANE) 100 UNIT/ML ~~LOC~~ SUSP
27.0000 [IU] | Freq: Every day | SUBCUTANEOUS | Status: DC
  Administered 2021-02-14: 27 [IU] via SUBCUTANEOUS
  Filled 2021-02-14: qty 10

## 2021-02-14 MED ORDER — OXYTOCIN-SODIUM CHLORIDE 30-0.9 UT/500ML-% IV SOLN
1.0000 m[IU]/min | INTRAVENOUS | Status: DC
  Administered 2021-02-14: 2 m[IU]/min via INTRAVENOUS
  Administered 2021-02-14: 18 m[IU]/min via INTRAVENOUS
  Administered 2021-02-14: 16 m[IU]/min via INTRAVENOUS
  Administered 2021-02-14: 14 m[IU]/min via INTRAVENOUS
  Administered 2021-02-14: 20 m[IU]/min via INTRAVENOUS
  Filled 2021-02-14: qty 500

## 2021-02-14 MED ORDER — LABETALOL HCL 5 MG/ML IV SOLN: 40.0000 mg | INTRAVENOUS | Status: DC | PRN

## 2021-02-14 MED ORDER — HYDRALAZINE HCL 20 MG/ML IJ SOLN: 10.0000 mg | INTRAMUSCULAR | Status: DC | PRN

## 2021-02-14 MED ORDER — LIDOCAINE HCL (PF) 1 % IJ SOLN
INTRAMUSCULAR | Status: DC | PRN
  Administered 2021-02-14: 10 mL via EPIDURAL

## 2021-02-14 MED ORDER — LABETALOL HCL 5 MG/ML IV SOLN: 20.0000 mg | INTRAVENOUS | Status: DC | PRN

## 2021-02-14 MED ORDER — DIPHENHYDRAMINE HCL 50 MG/ML IJ SOLN
12.5000 mg | INTRAMUSCULAR | Status: AC | PRN
  Administered 2021-02-14 (×3): 12.5 mg via INTRAVENOUS
  Filled 2021-02-14: qty 1

## 2021-02-14 MED ORDER — LABETALOL HCL 5 MG/ML IV SOLN: 80.0000 mg | INTRAVENOUS | Status: DC | PRN

## 2021-02-14 MED ORDER — LACTATED RINGERS IV SOLN: 500.0000 mL | Freq: Once | INTRAVENOUS | Status: DC

## 2021-02-14 MED ORDER — MISOPROSTOL 200 MCG PO TABS
ORAL_TABLET | ORAL | Status: AC
  Administered 2021-02-14: 800 ug via RECTAL
  Filled 2021-02-14: qty 4

## 2021-02-14 NOTE — Progress Notes (Signed)
Labor Progress Note Katherine Maldonado is a 35 y.o. G1P0 at [redacted]w[redacted]d presented for IOL Pre-E w/o SF S:  Patient is feeling comfortable with epidural. Denies HA, blurry vision, RUQ pain, chest pain or SOB  O:  BP 121/80   Pulse 93   Temp 98.1 F (36.7 C) (Oral)   Resp 16   Ht 5\' 7"  (1.702 m)   Wt 108.6 kg   LMP 06/04/2020 (Approximate)   BMI 37.51 kg/m  EFM: 145bpm/moderate variability/+ accels, no decels Toco: q4 min, not tracing well  CVE: Dilation: 4.5 Effacement (%): 60, 70 Cervical Position: Anterior Station: -2 Presentation: Vertex Exam by:: Dr [email or web address]   A&P: 35 y.o. G1P0 [redacted]w[redacted]d IOL PreE #Labor: Progressing well. S/P cytotec, FB now on Pitocin. Pt thinks she SROM-I cannot feel a bag on exam, no significant loss of fluid. #Pain: Epidural in place #FWB: Cat 1 #GBS positive> Vanc #PreE: Pt is asymptomatic. She had two severe range pressure-but remaining asymptomatic and resolved with pain management. Bps have ranged 140s/90s now mainly normotensive. If she has another severe range, will likely start medication/mag. #A2GDM: On NPH 27 morning and 20 afternoon as well as SSI 5 U in 24hr. Recent CBG-156. Continue monitoring.  [redacted]w[redacted]d, MD 11:45 AM

## 2021-02-14 NOTE — Progress Notes (Signed)
Labor Progress Note Katherine Maldonado is a 35 y.o. G1P0 at [redacted]w[redacted]d presented for IOL-PreE S: Patient is resting, breathing through contractions. Denies HA, blurry vision, RUQ pain, edema, N/V/D.  O:  BP (!) 140/91   Pulse 95   Temp 98.1 F (36.7 C) (Oral)   Resp 18   Ht 5\' 7"  (1.702 m)   Wt 108.6 kg   LMP 06/04/2020 (Approximate)   BMI 37.51 kg/m  EFM: baseline 135 BPM/+accels/-decels/mod variability Toco: contractions q23min Not tracing well   CVE: Dilation: 4.5 Effacement (%): 70 Cervical Position: Anterior Station: -2 Presentation: Vertex Exam by:: Jaece Ducharme    A&P: 35 y.o. G1P0 [redacted]w[redacted]d presenting for IOL-PreE #Labor: S/P cytotec, FB dislodged. Pt thinks she SROM-I cannot feel a bag on exam, no significant loss of fluid. Will start pit 2/2 and titrate as tolerated.  #Pain: Epidural as desired  #FWB: cat 1 #GBS positive>Vanc #PreE: Pt is asymptomatic. She had one severe range pressure-165/100. Bps have ranged 140s/90s. If she has another severe range, will likely start medication/mag.  #A2GDM: On NPH 55 morning and 20 afternoon as well as SSI (received two units @1904 ). Recent CBG-121. Continue monitoring.   [redacted]w[redacted]d, MD Center for , [facility] 5:21 AM

## 2021-02-14 NOTE — Progress Notes (Addendum)
Labor Progress Note Katherine Maldonado is a 35 y.o. G1P0 at [redacted]w[redacted]d presented for IOL Pre-E w/o SF S:  Patient is feeling comfortable with epidural. Denies HA, blurry vision, RUQ pain, chest pain or SOB  O:  BP 123/67   Pulse 83   Temp 97.7 F (36.5 C) (Oral)   Resp 18   Ht 5\' 7"  (1.702 m)   Wt 108.6 kg   LMP 06/04/2020 (Approximate)   BMI 37.51 kg/m  EFM: 130bpm/moderate variability prior to AROM, mild to moderate after/+ accels, no decels Toco: q3 min, IUPC in place  CVE: Dilation: 4.5 Effacement (%): 60 Cervical Position: Anterior Station: -3 Presentation: Vertex Exam by:: Dr [email or web address]   A&P: 35 y.o. G1P0 [redacted]w[redacted]d IOL PreE #Labor: S/P cytotec, FB now on Pitocin. AROM clear fluid @ 1500 and IUPC placed #Pain: Epidural in place #FWB: Cat 1 #GBS positive> Vanc #PreE: Pt is asymptomatic. She had two severe range pressure-but remaining asymptomatic and resolved with pain management. Bps have ranged 140s/90s now mainly normotensive. If she has another severe range, will likely start medication/mag. #A2GDM: On NPH 27 morning and 20 afternoon as well as SSI 6 U in 24hr. Recent CBG-67 and given some juice and will recheck. Continue monitoring.  [redacted]w[redacted]d, MD 3:11 PM

## 2021-02-14 NOTE — Progress Notes (Signed)
Infant has hiccups monitor picking up hiccups more than heart rante

## 2021-02-14 NOTE — Progress Notes (Addendum)
Labor Progress Note Katherine Maldonado is a 35 y.o. G1P0 at [redacted]w[redacted]d presented for IOL Pre-E w/o SF S:  Patient is feeling more comfortable after epidural placement. Denies HA, blurry vision, RUQ pain, chest pain or SOB  O:  BP 121/67   Pulse 98   Temp 98.1 F (36.7 C) (Oral)   Resp 17   Ht 5\' 7"  (1.702 m)   Wt 108.6 kg   LMP 06/04/2020 (Approximate)   BMI 37.51 kg/m  EFM: 145bpm/moderate variability/+ accels, no decels Toco: q4 min  CVE: Dilation: 4 Effacement (%): 80 Cervical Position: Anterior Station: -2 Presentation: Vertex Exam by:: S [email or web address] RN   A&P: 35 y.o. G1P0 [redacted]w[redacted]d IOL PreE #Labor: Progressing well. S/P cytotec, FB dislodged. Continue pit 2/2 and titrate as tolerated #Pain: Epidural in place #FWB: Cat 1 #GBS positive> Vanc #PreE: Pt is asymptomatic. She had one severe range pressure-165/100. Bps have ranged 140s/90s now mainly normotensive. If she has another severe range, will likely start medication/mag. #A2GDM: On NPH 27 morning and 20 afternoon as well as SSI 5 U in 24hr. Recent CBG-156. Continue monitoring.  [redacted]w[redacted]d, MD 9:26 AM

## 2021-02-14 NOTE — Anesthesia Procedure Notes (Addendum)
Epidural Patient location during procedure: OB Start time: 02/14/2021 6:10 AM End time: 02/14/2021 6:12 AM  Staffing Anesthesiologist: Leilani Able, MD Performed: anesthesiologist   Preanesthetic Checklist Completed: patient identified, IV checked, site marked, risks and benefits discussed, surgical consent, monitors and equipment checked, pre-op evaluation and timeout performed  Epidural Patient position: sitting Prep: DuraPrep and site prepped and draped Patient monitoring: continuous pulse ox and blood pressure Approach: midline Location: L3-L4 Injection technique: LOR air  Needle:  Needle type: Tuohy  Needle gauge: 17 G Needle length: 9 cm and 9 Needle insertion depth: 7 cm Catheter type: closed end flexible Catheter size: 19 Gauge Catheter at skin depth: 12 cm Test dose: negative and Other  Assessment Events: blood not aspirated, injection not painful, no injection resistance, no paresthesia and negative IV test  Additional Notes Reason for block:procedure for pain

## 2021-02-14 NOTE — Progress Notes (Signed)
4 oz of apple juice given  ?

## 2021-02-14 NOTE — Anesthesia Preprocedure Evaluation (Addendum)
Anesthesia Evaluation  Patient identified by MRN, date of birth, ID band Patient awake    Reviewed: Allergy & Precautions, H&P , Patient's Chart, lab work & pertinent test results  Airway Mallampati: II  TM Distance: >3 FB Neck ROM: full    Dental no notable dental hx.    Pulmonary former smoker,    Pulmonary exam normal breath sounds clear to auscultation       Cardiovascular negative cardio ROS Normal cardiovascular exam Rhythm:Regular Rate:Normal     Neuro/Psych    GI/Hepatic negative GI ROS, Neg liver ROS,   Endo/Other  negative endocrine ROSdiabetes, Insulin Dependent  Renal/GU negative Renal ROS  negative genitourinary   Musculoskeletal negative musculoskeletal ROS (+)   Abdominal (+) + obese,   Peds  Hematology negative hematology ROS (+)   Anesthesia Other Findings   Reproductive/Obstetrics (+) Pregnancy                            Anesthesia Physical Anesthesia Plan  ASA: 3  Anesthesia Plan: Epidural   Post-op Pain Management:    Induction:   PONV Risk Score and Plan:   Airway Management Planned:   Additional Equipment:   Intra-op Plan:   Post-operative Plan:   Informed Consent: I have reviewed the patients History and Physical, chart, labs and discussed the procedure including the risks, benefits and alternatives for the proposed anesthesia with the patient or authorized representative who has indicated his/her understanding and acceptance.       Plan Discussed with:   Anesthesia Plan Comments:         Anesthesia Quick Evaluation

## 2021-02-14 NOTE — Progress Notes (Signed)
Top of left leg an area of about 4 in x 2 in several raised red patches.

## 2021-02-14 NOTE — Progress Notes (Signed)
LABOR PROGRESS NOTE  Katherine Maldonado is a 35 y.o. G1P0 at [redacted]w[redacted]d  admitted for IOL Pre-e w/o SF.   Subjective: Feeling contractions and back spasms. RN having difficulty tracing fetus, especially with frequent position changes.   Objective: BP (!) 155/88   Pulse 86   Temp 97.8 F (36.6 C) (Axillary)   Resp 20   Ht 5\' 7"  (1.702 m)   Wt 108.6 kg   LMP 06/04/2020 (Approximate)   BMI 37.51 kg/m  or  Vitals:   02/14/21 1702 02/14/21 1732 02/14/21 1800 02/14/21 1815  BP: 131/80 (!) 155/86 (!) 171/96 (!) 155/88  Pulse: 76 88 82 86  Resp:  20  20  Temp:    97.8 F (36.6 C)  TempSrc:    Axillary  Weight:      Height:        Dilation: 4 Effacement (%): 80 Cervical Position: Middle Station: -2 Presentation: Vertex Exam by:: [email or web address] FHT: baseline rate 135, moderate varibility, +acel, -decel Toco: every 2-3 min   Labs: Lab Results  Component Value Date   WBC 17.9 (H) 02/14/2021   HGB 12.0 02/14/2021   HCT 36.3 02/14/2021   MCV 88.5 02/14/2021   PLT 317 02/14/2021    Patient Active Problem List   Diagnosis Date Noted   Encounter for induction of labor 02/13/2021   GBS (group B Streptococcus carrier), +RV culture, currently pregnant 02/06/2021   Antepartum mild preeclampsia, third trimester 01/30/2021   Gestational diabetes mellitus (GDM) requiring insulin 12/24/2020   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 G1P0 at [redacted]w[redacted]d here for IOL for Pre-e w/o SF.   Labor: Some progression in effacement and station, however minimal cervical change. FSE placed for fetal tracing. Cont titrating pit as tolerated.   Fetal Wellbeing:  Cat 1  Pain Control:   Epidural  Anticipated MOD:  VD  #Pre-e w/o SF: Noted BP above of 171/96, however inaccurate as taken while patient was laying completely on that side of her body. Rechecked on her other arm, elevated at 155/88 with current painful contractions. Will monitor closely, adding in labetalol orders if needed, will add Mg if becomes severe range.   #A2GDM: Currently on NPH 27 U am, 20 pm with SSI in between. Most recent CBGs in range, 156 this am taken shortly after eating. Low threshold to place on endotool.   [redacted]w[redacted]d, DO  OB Fellow  02/14/2021, 6:21 PM

## 2021-02-15 ENCOUNTER — Encounter (HOSPITAL_COMMUNITY)
Admission: AD | Disposition: A | Discharge status: Home / Self Care | Payer: Self-pay | Source: Home / Self Care | Attending: Obstetrics and Gynecology

## 2021-02-15 ENCOUNTER — Encounter (HOSPITAL_COMMUNITY): Payer: Self-pay

## 2021-02-15 ENCOUNTER — Encounter (HOSPITAL_COMMUNITY): Payer: Self-pay | Admitting: Anesthesiology

## 2021-02-15 LAB — CBC WITH DIFFERENTIAL/PLATELET
Abs Immature Granulocytes: 0.16 10*3/uL — ABNORMAL HIGH (ref 0.00–0.07)
Basophils Absolute: 0 10*3/uL (ref 0.0–0.1)
Basophils Relative: 0 %
Eosinophils Absolute: 0.2 10*3/uL (ref 0.0–0.5)
Eosinophils Relative: 1 %
HCT: 23.5 % — ABNORMAL LOW (ref 36.0–46.0)
Hemoglobin: 8 g/dL — ABNORMAL LOW (ref 12.0–15.0)
Immature Granulocytes: 1 %
Lymphocytes Relative: 17 %
Lymphs Abs: 2.9 10*3/uL (ref 0.7–4.0)
MCH: 30 pg (ref 26.0–34.0)
MCHC: 34 g/dL (ref 30.0–36.0)
MCV: 88 fL (ref 80.0–100.0)
Monocytes Absolute: 1.4 10*3/uL — ABNORMAL HIGH (ref 0.1–1.0)
Monocytes Relative: 8 %
Neutro Abs: 12.7 10*3/uL — ABNORMAL HIGH (ref 1.7–7.7)
Neutrophils Relative %: 73 %
Platelets: 257 10*3/uL (ref 150–400)
RBC: 2.67 MIL/uL — ABNORMAL LOW (ref 3.87–5.11)
RDW: 14.8 % (ref 11.5–15.5)
WBC: 17.3 10*3/uL — ABNORMAL HIGH (ref 4.0–10.5)
nRBC: 0 % (ref 0.0–0.2)

## 2021-02-15 LAB — CBC
HCT: 29.1 % — ABNORMAL LOW (ref 36.0–46.0)
Hemoglobin: 9.9 g/dL — ABNORMAL LOW (ref 12.0–15.0)
MCH: 29.9 pg (ref 26.0–34.0)
MCHC: 34 g/dL (ref 30.0–36.0)
MCV: 87.9 fL (ref 80.0–100.0)
Platelets: 343 10*3/uL (ref 150–400)
RBC: 3.31 MIL/uL — ABNORMAL LOW (ref 3.87–5.11)
RDW: 14.6 % (ref 11.5–15.5)
WBC: 26.9 10*3/uL — ABNORMAL HIGH (ref 4.0–10.5)
nRBC: 0 % (ref 0.0–0.2)

## 2021-02-15 LAB — GLUCOSE, CAPILLARY
Glucose-Capillary: 170 mg/dL — ABNORMAL HIGH (ref 70–99)
Glucose-Capillary: 95 mg/dL (ref 70–99)
Glucose-Capillary: 138 mg/dL — ABNORMAL HIGH (ref 70–99)

## 2021-02-15 LAB — COMPREHENSIVE METABOLIC PANEL
ALT: 13 U/L (ref 0–44)
AST: 23 U/L (ref 15–41)
Albumin: 1.9 g/dL — ABNORMAL LOW (ref 3.5–5.0)
Alkaline Phosphatase: 179 U/L — ABNORMAL HIGH (ref 38–126)
Anion gap: 9 (ref 5–15)
BUN: 6 mg/dL (ref 6–20)
CO2: 20 mmol/L — ABNORMAL LOW (ref 22–32)
Calcium: 7.9 mg/dL — ABNORMAL LOW (ref 8.9–10.3)
Chloride: 102 mmol/L (ref 98–111)
Creatinine, Ser: 0.72 mg/dL (ref 0.44–1.00)
GFR, Estimated: >60 mL/min (ref >60)
Glucose, Bld: 148 mg/dL — ABNORMAL HIGH (ref 70–99)
Potassium: 3.5 mmol/L (ref 3.5–5.1)
Sodium: 131 mmol/L — ABNORMAL LOW (ref 135–145)
Total Bilirubin: 0.8 mg/dL (ref 0.3–1.2)
Total Protein: 4.9 g/dL — ABNORMAL LOW (ref 6.5–8.1)

## 2021-02-15 LAB — MAGNESIUM: Magnesium: 3.7 mg/dL — ABNORMAL HIGH (ref 1.7–2.4)

## 2021-02-15 SURGERY — LIGATION, FALLOPIAN TUBE, POSTPARTUM
Anesthesia: Epidural | Laterality: Bilateral

## 2021-02-15 MED ORDER — NIFEDIPINE ER OSMOTIC RELEASE 30 MG PO TB24
30.0000 mg | ORAL_TABLET | Freq: Every day | ORAL | Status: DC
  Administered 2021-02-15 – 2021-02-16 (×2): 30 mg via ORAL
  Filled 2021-02-15 (×2): qty 1

## 2021-02-15 MED ORDER — FENTANYL CITRATE (PF) 100 MCG/2ML IJ SOLN
INTRAMUSCULAR | Status: AC
  Administered 2021-02-15: 100 ug
  Filled 2021-02-15: qty 2

## 2021-02-15 MED ORDER — IBUPROFEN 600 MG PO TABS
600.0000 mg | ORAL_TABLET | Freq: Four times a day (QID) | ORAL | Status: DC
  Administered 2021-02-15 – 2021-02-16 (×4): 600 mg via ORAL
  Filled 2021-02-15 (×8): qty 1

## 2021-02-15 MED ORDER — LABETALOL HCL 5 MG/ML IV SOLN: 20.0000 mg | INTRAVENOUS | Status: DC | PRN

## 2021-02-15 MED ORDER — ONDANSETRON HCL 4 MG PO TABS: 4.0000 mg | ORAL_TABLET | ORAL | Status: DC | PRN

## 2021-02-15 MED ORDER — TETANUS-DIPHTH-ACELL PERTUSSIS 5-2.5-18.5 LF-MCG/0.5 IM SUSY: 0.5000 mL | PREFILLED_SYRINGE | Freq: Once | INTRAMUSCULAR | Status: DC

## 2021-02-15 MED ORDER — MAGNESIUM SULFATE BOLUS VIA INFUSION
4.0000 g | Freq: Once | INTRAVENOUS | Status: AC
  Administered 2021-02-15: 4 g via INTRAVENOUS
  Filled 2021-02-15: qty 1000

## 2021-02-15 MED ORDER — PRENATAL MULTIVITAMIN CH
1.0000 | ORAL_TABLET | Freq: Every day | ORAL | Status: DC
  Administered 2021-02-15 – 2021-02-16 (×2): 1 via ORAL
  Filled 2021-02-15 (×2): qty 1

## 2021-02-15 MED ORDER — METFORMIN HCL 500 MG PO TABS: 500.0000 mg | ORAL_TABLET | Freq: Two times a day (BID) | ORAL | Status: DC

## 2021-02-15 MED ORDER — INSULIN ASPART 100 UNIT/ML IJ SOLN
0.0000 [IU] | Freq: Three times a day (TID) | INTRAMUSCULAR | Status: DC
  Administered 2021-02-15: 3 [IU] via SUBCUTANEOUS

## 2021-02-15 MED ORDER — ZOLPIDEM TARTRATE 5 MG PO TABS: 5.0000 mg | ORAL_TABLET | Freq: Every evening | ORAL | Status: DC | PRN

## 2021-02-15 MED ORDER — SENNOSIDES-DOCUSATE SODIUM 8.6-50 MG PO TABS
2.0000 | ORAL_TABLET | ORAL | Status: DC
  Administered 2021-02-15: 2 via ORAL
  Filled 2021-02-15 (×2): qty 2

## 2021-02-15 MED ORDER — WITCH HAZEL-GLYCERIN EX PADS: 1.0000 "application " | MEDICATED_PAD | CUTANEOUS | Status: DC | PRN

## 2021-02-15 MED ORDER — MAGNESIUM SULFATE 40 GM/1000ML IV SOLN
2.0000 g/h | INTRAVENOUS | Status: AC
  Administered 2021-02-15 (×2): 2 g/h via INTRAVENOUS
  Filled 2021-02-15 (×2): qty 1000

## 2021-02-15 MED ORDER — ENOXAPARIN SODIUM 40 MG/0.4ML IJ SOSY
40.0000 mg | PREFILLED_SYRINGE | INTRAMUSCULAR | Status: DC
Start: 2021-02-16 — End: 2021-02-15

## 2021-02-15 MED ORDER — COCONUT OIL OIL: 1.0000 "application " | TOPICAL_OIL | Status: DC | PRN

## 2021-02-15 MED ORDER — CYCLOBENZAPRINE HCL 10 MG PO TABS
10.0000 mg | ORAL_TABLET | Freq: Three times a day (TID) | ORAL | Status: DC | PRN
  Administered 2021-02-15: 10 mg via ORAL
  Filled 2021-02-15: qty 2

## 2021-02-15 MED ORDER — DIBUCAINE (PERIANAL) 1 % EX OINT: 1.0000 "application " | TOPICAL_OINTMENT | CUTANEOUS | Status: DC | PRN

## 2021-02-15 MED ORDER — BENZOCAINE-MENTHOL 20-0.5 % EX AERO: 1.0000 "application " | INHALATION_SPRAY | CUTANEOUS | Status: DC | PRN

## 2021-02-15 MED ORDER — LABETALOL HCL 5 MG/ML IV SOLN: 80.0000 mg | INTRAVENOUS | Status: DC | PRN

## 2021-02-15 MED ORDER — HYDRALAZINE HCL 20 MG/ML IJ SOLN: 10.0000 mg | INTRAMUSCULAR | Status: DC | PRN

## 2021-02-15 MED ORDER — CLINDAMYCIN PHOSPHATE 900 MG/50ML IV SOLN
900.0000 mg | Freq: Once | INTRAVENOUS | Status: AC
  Administered 2021-02-15: 900 mg via INTRAVENOUS
  Filled 2021-02-15: qty 50

## 2021-02-15 MED ORDER — LACTATED RINGERS IV SOLN: INTRAVENOUS | Status: DC

## 2021-02-15 MED ORDER — ACETAMINOPHEN 325 MG PO TABS: 650.0000 mg | ORAL_TABLET | ORAL | Status: DC | PRN

## 2021-02-15 MED ORDER — SODIUM CHLORIDE 0.9 % IV SOLN
500.0000 mg | Freq: Once | INTRAVENOUS | Status: AC
  Administered 2021-02-15: 500 mg via INTRAVENOUS
  Filled 2021-02-15: qty 25

## 2021-02-15 MED ORDER — ENOXAPARIN SODIUM 60 MG/0.6ML IJ SOSY
60.0000 mg | PREFILLED_SYRINGE | INTRAMUSCULAR | Status: DC
  Filled 2021-02-15: qty 0.6

## 2021-02-15 MED ORDER — METFORMIN HCL 500 MG PO TABS: 500.0000 mg | ORAL_TABLET | Freq: Every day | ORAL | Status: DC

## 2021-02-15 MED ORDER — ONDANSETRON HCL 4 MG/2ML IJ SOLN
4.0000 mg | INTRAMUSCULAR | Status: DC | PRN
  Administered 2021-02-15: 4 mg via INTRAVENOUS
  Filled 2021-02-15: qty 2

## 2021-02-15 MED ORDER — DIPHENHYDRAMINE HCL 25 MG PO CAPS: 25.0000 mg | ORAL_CAPSULE | Freq: Four times a day (QID) | ORAL | Status: DC | PRN

## 2021-02-15 MED ORDER — LABETALOL HCL 5 MG/ML IV SOLN: 40.0000 mg | INTRAVENOUS | Status: DC | PRN

## 2021-02-15 MED ORDER — SIMETHICONE 80 MG PO CHEW: 80.0000 mg | CHEWABLE_TABLET | ORAL | Status: DC | PRN

## 2021-02-15 NOTE — Lactation Note (Signed)
This note was copied from a baby's chart. Lactation Consultation Note RN stated that mom needed some time to regroup and wanted the baby under the warmer. Mom doesn't want to hold the baby right now. Mom does want to BF but needs some time to herself to process things. LC will f/u w/mom on [facility] floor. Mom is on mag.  Patient Name: Katherine Maldonado Today's Date: 02/15/2021   Age:36 hours  Maternal Data    Feeding    LATCH Score                    Lactation Tools Discussed/Used    Interventions    Discharge    Consult Status      Charyl Dancer 02/15/2021, 12:10 AM

## 2021-02-15 NOTE — Lactation Note (Addendum)
This note was copied from a baby's chart. Lactation Consultation Note  Patient Name: Katherine Maldonado [id number] Date: 02/15/2021 Reason for consult: Follow-up assessment;Mother's request;Early term 37-38.6wks;Maternal endocrine disorder (Mom had PPH, this is her first time attempting to latch infant at the breast. Per mom her choice is breast and formula feeding infant.) Age:36 hours Per mom, infant has been spitty and infant had medium emesis while LC was in the room. P1, This is mom's attempted to latch infant at breast due to being tired and having PPH, infant did not sustain latch due to recently having 18 mls of formula less than  2 minutes.  LC discussed hand expression and mom taught back  colostrum present, and  Mom was shown how to use DEBP, mom had pumped 18 mls  EBM and was still pumping when LC left the room.  LC discussed infant's input and output. Mom made aware of O/P services, breastfeeding support groups, community resources, and our phone # for post-discharge questions.   Mom's plan:  1- Going forward mom will attempt to latch infant at breast for every feeding 8 to 12 or more times within 24 hours, STS. 2-Mom knows to call RN or LC  if she has breastfeeding questions, concerns or need assistance with latching infant at the breast.  3- Mom will offer her EBM first that she pumped before  supplementing infant with formula. 4- Day 1 mom knows to supplement infant with 5-7 mls of EBM/ formula after infant latches at the breast, if formula feeding only to offer 5- 15 mls per feeding.  Maternal Data Has patient been taught Hand Expression?: Yes Does the patient have breastfeeding experience prior to this delivery?: No  Feeding Mother's Current Feeding Choice: Breast Milk and Formula  LATCH Score Latch: Too sleepy or reluctant, no latch achieved, no sucking elicited. (Infant breifly latched due to having 18 mls of formua prior to mom latching infant at the breast.)  Audible  Swallowing: Spontaneous and intermittent  Type of Nipple: Everted at rest and after stimulation  Comfort (Breast/Nipple): Soft / non-tender  Hold (Positioning): Assistance needed to correctly position infant at breast and maintain latch.  LATCH Score: 7   Lactation Tools Discussed/Used Tools: Pump Breast pump type: Double-Electric Breast Pump Pump Education: Setup, frequency, and cleaning;Milk Storage Reason for Pumping: To help establish milk supply, infant previosuly hasn't latched at the breast and mom with PPH, Mag and low hgb level. Pumping frequency: Mom knows to pump every 3 hours for 15 minutes on inital setting. Pumped volume: 18 mL (Mom was still pumping when LC left the room.)  Interventions    Discharge Pump: DEBP;Personal (Per mom, she has Spectra 2 DEBP at home.) [facility] Program: Yes  Consult Status Consult Status: Follow-up Date: 02/16/21 Follow-up type: In-patient    Danelle Earthly 02/15/2021, 4:18 PM

## 2021-02-15 NOTE — Progress Notes (Signed)
POSTPARTUM PROGRESS NOTE  Post Partum Day 1  Subjective:  Katherine Maldonado is a 35 y.o. G1P1001 s/p NSVD at [redacted]w[redacted]d.  Pt seen to discuss postpartum tubal.  Patient counseled regarding bilateral tubal ligation. Reviewed that this is a permanent procedure and that she will not be able to have children after it is done. Reviewed risks of bilateral tubal ligation including infection, hemorrhage, damage to surrounding tissue and organs, risk of regret. Reviewed that bilateral tubal ligation is not 100% effective and she should take a pregnancy test if she believes for any reason she may be pregnant. Reviewed slightly increased risk of ectopic pregnancy and need to seek care if she becomes pregnant. She understands this is an elective procedure and again affirms her desire. Consent signed, also consents to blood transfusion if necessary.  Discussed mild difficulty feeling uterine fundus with pannus.  She states she has work issues and may not be able to get the procedure done until the new year.  She has noon cbc pending.  If there is a significant drop in h/h, will cancel procedure for today.  Objective: Blood pressure 115/81, pulse (!) 112, temperature 98 F (36.7 C), resp. rate 20, height 5\' 7"  (1.702 m), weight 108.6 kg, last menstrual period 06/04/2020, SpO2 95 %, unknown if currently breastfeeding.  Physical Exam:  General: alert, cooperative and no distress Chest: no respiratory distress Heart:regular rate, distal pulses intact Abdomen: soft, nontender, uterine fundus somewhat deep Uterine Fundus: firm, appropriately tender DVT Evaluation: No calf swelling or tenderness Extremities: minimal edema Skin: warm, dry  Recent Labs    02/14/21 0456 02/15/21 0142  HGB 12.0 9.9*  HCT 36.3 29.1*    Assessment/Plan: Katherine Maldonado is a 35 y.o. G1P1001 s/p NSVD at [redacted]w[redacted]d   Recheck cbc at noon, if significant drop in h/h then will cancel procedure and she will get interval tubal at later  date.  LOS: 2 days   [redacted]w[redacted]d, MD Faculty attending 02/15/2021, 11:16 AM

## 2021-02-15 NOTE — Progress Notes (Signed)
POSTPARTUM PROGRESS NOTE  PPD #1  Subjective:  Katherine Maldonado is a 35 y.o. G1P1001 s/p NSVD at [redacted]w[redacted]d. Today she notes feeling weak and tired. Foley cath in place, she has not yet ambulated.  Denies nausea or vomiting. She has not passed flatus, no BM.  Notes some back spasms due to lying in the same position, denies pelvic or abodminal pain.  Lochia appropriate Denies fever/chills/chest pain/SOB.  no HA, no blurry vision, noRUQ pain  Objective: Blood pressure (!) 140/91, pulse (!) 107, temperature 98.3 F (36.8 C), temperature source Oral, resp. rate 20, height 5\' 7"  (1.702 m), weight 108.6 kg, last menstrual period 06/04/2020, SpO2 97 %, unknown if currently breastfeeding.  Physical Exam:  General: alert, cooperative and no distress Chest: no respiratory distress Heart: mild tachycardia noted Abdomen: obese, soft, nontender Uterine Fundus: firm, immediately below umbilicus Incision: n/a DVT Evaluation: No calf swelling or tenderness Extremities: no edema Skin: warm, dry  Results for orders placed or performed during the hospital encounter of 02/13/21 (from the past 24 hour(s))  Glucose, capillary     Status: Abnormal   Collection Time: 02/14/21  7:58 AM  Result Value Ref Range   Glucose-Capillary 156 (H) 70 - 99 mg/dL  Glucose, capillary     Status: None   Collection Time: 02/14/21 11:53 AM  Result Value Ref Range   Glucose-Capillary 91 70 - 99 mg/dL  Glucose, capillary     Status: Abnormal   Collection Time: 02/14/21  2:35 PM  Result Value Ref Range   Glucose-Capillary 67 (L) 70 - 99 mg/dL  Glucose, capillary     Status: None   Collection Time: 02/14/21  5:26 PM  Result Value Ref Range   Glucose-Capillary 84 70 - 99 mg/dL  Glucose, capillary     Status: None   Collection Time: 02/14/21 10:01 PM  Result Value Ref Range   Glucose-Capillary 73 70 - 99 mg/dL  Comprehensive metabolic panel     Status: Abnormal   Collection Time: 02/15/21  1:42 AM  Result Value Ref  Range   Sodium 131 (L) 135 - 145 mmol/L   Potassium 3.5 3.5 - 5.1 mmol/L   Chloride 102 98 - 111 mmol/L   CO2 20 (L) 22 - 32 mmol/L   Glucose, Bld 148 (H) 70 - 99 mg/dL   BUN 6 6 - 20 mg/dL   Creatinine, Ser 04/17/21 0.44 - 1.00 mg/dL   Calcium 7.9 (L) 8.9 - 10.3 mg/dL   Total Protein 4.9 (L) 6.5 - 8.1 g/dL   Albumin 1.9 (L) 3.5 - 5.0 g/dL   AST 23 15 - 41 U/L   ALT 13 0 - 44 U/L   Alkaline Phosphatase 179 (H) 38 - 126 U/L   Total Bilirubin 0.8 0.3 - 1.2 mg/dL   GFR, Estimated 7.25 >36 mL/min   Anion gap 9 5 - 15  Magnesium     Status: Abnormal   Collection Time: 02/15/21  1:42 AM  Result Value Ref Range   Magnesium 3.7 (H) 1.7 - 2.4 mg/dL  CBC     Status: Abnormal   Collection Time: 02/15/21  1:42 AM  Result Value Ref Range   WBC 26.9 (H) 4.0 - 10.5 K/uL   RBC 3.31 (L) 3.87 - 5.11 MIL/uL   Hemoglobin 9.9 (L) 12.0 - 15.0 g/dL   HCT 04/17/21 (L) 40.3 - 47.4 %   MCV 87.9 80.0 - 100.0 fL   MCH 29.9 26.0 - 34.0 pg   MCHC 34.0  30.0 - 36.0 g/dL   RDW 83.4 19.6 - 22.2 %   Platelets 343 150 - 400 K/uL   nRBC 0.0 0.0 - 0.2 %    Assessment/Plan: Katherine Maldonado is a 35 y.o. G1P1001 s/p NSVD at [redacted]w[redacted]d PPD#1 complicated by: 1) Preeclampsia with severe features- based on BP -plan for IV Magnesium x 24hr -started on ProcardiaXL 30mg  daily -currently asymptomatic -labs stable as above  2) GDMA2, concern for T2DM -metformin at supper, novolog sliding scale -sugars as above, will continue to closely monitor -currently NPO, will transition to carb modified diet  3) PPH- EBL 2300cc, s/p TXA, cytotec - Hgb 12> 9.9, repeat later today - IV Venofer x 1 running -VS appropriate, will continue to closely monitor for signs of symptomatic anemia  4) Maternal well being -pain medication as needed -Lovenox for DVT prophylaxis  Contraception: desires ppBTL today- NPO Feeding: bottle  Dispo: continue with care as outlined above   LOS: 2 days   , DO Faculty Attending, Center  [facility] 02/15/2021, 6:50 AM

## 2021-02-15 NOTE — Progress Notes (Signed)
Patient Vitals for the past 4 hrs:  BP Temp Temp src Pulse  02/15/21 0049 134/74 100.3 F (37.9 C) Oral (!) 144  02/15/21 0034 -- 98.6 F (37 C) Oral --  02/15/21 0030 (!) 134/95 -- -- (!) 112  02/15/21 0015 (!) 160/95 -- -- (!) 125  02/15/21 0000 (!) 151/91 -- -- (!) 111  02/14/21 2345 (!) 167/100 -- -- (!) 123  02/14/21 2315 (!) 142/102 -- -- (!) 121  02/14/21 2232 (!) 152/99 -- -- (!) 123  02/14/21 2202 (!) 148/84 -- -- (!) 106   ~ 2500 blood loss (spontaneous clots/manual removal of clots); pitocin continued, indwelling catheter placed. Bleeding normal at present.  Dr. Charlotta Newton notified.  Will ppx w/clindamycin 900mg  X 1, check CBC now.

## 2021-02-15 NOTE — Progress Notes (Signed)
Patient declines having her tubal ligation today, stating "she would rather heal from childbirth first" . Dr. Donavan Foil notified. Carmelina Dane, RN

## 2021-02-15 NOTE — Discharge Summary (Signed)
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 yo G1P1001 at 349w1das admitted to the hospital 02/13/2021 for induction of labor.  Indication for induction: Severe Preeclampsia and A2 DM.  Patient had an uncomplicated labor course as follows: Membrane Rupture Time/Date: 2:59 PM ,02/14/2021   Delivery Method:Vaginal, Spontaneous  Episiotomy: None  Lacerations:  None  Details of delivery can be found in separate delivery note.  Patient received intrapartum and postpartum magnesium sulfate for eclampsia prophylaxis as per protocol. BP control was obtained with Procardia XL, there were no further immediate complications.  Otherwise, patient had a routine postpartum course. Patient is discharged home in stable condition on 02/16/2021, and will follow up in the office for BP check in one week.   Newborn Data: Birth date:02/14/2021  Birth time:11:02 PM  Gender:Female  Living status:Living  Apgars:9 ,10  Weight:3419 g   Magnesium Sulfate  received: Yes: Seizure prophylaxis BMZ received: No Rhophylac:N/A MMR:N/A T-DaP: Declined Flu: N/A Transfusion:No  Physical exam  Vitals:   02/15/21 2336 02/15/21 2337 02/16/21 0555 02/16/21 0748  BP: (!) 101/48 98/60 (!) 115/59 (!) 105/43  Pulse: 83 88 90 89  Resp:  18 16 16   Temp:  98.6 F (37 C) 98 F (36.7 C) 98.2 F (36.8 C)  TempSrc:  Oral Oral Oral  SpO2:  99% 100% 99%  Weight:      Height:       General: alert, cooperative, and no distress Lochia: appropriate Uterine Fundus: firm, NT Incision: N/A DVT Evaluation: No evidence of DVT seen on physical exam. Negative Homan's sign. Labs: Lab Results  Component Value Date   WBC 17.3 (H) 02/15/2021   HGB 8.0 (L) 02/15/2021   HCT 23.5 (L) 02/15/2021   MCV 88.0 02/15/2021   PLT 257 02/15/2021   CMP Latest Ref Rng & Units 02/15/2021  Glucose 70 - 99 mg/dL 148(H)  BUN 6 - 20 mg/dL 6  Creatinine 0.44 - 1.00 mg/dL 0.72  Sodium 135 - 145 mmol/L 131(L)  Potassium 3.5 - 5.1 mmol/L 3.5  Chloride 98 - 111 mmol/L 102  CO2 22 - 32 mmol/L 20(L)  Calcium 8.9 - 10.3 mg/dL 7.9(L)  Total Protein 6.5 - 8.1 g/dL 4.9(L)  Total Bilirubin 0.3 - 1.2 mg/dL 0.8  Alkaline Phos 38 - 126 U/L 179(H)  AST 15 - 41 U/L 23  ALT 0 - 44 U/L 13   Edinburgh Score: No  flowsheet data found.   After visit meds:  Allergies as of 02/16/2021       Reactions   Latex Swelling, Rash   Penicillins Rash, Other (See Comments)   Did it involve swelling of the face/tongue/throat, SOB, or low BP? Unknown Did it involve sudden or severe rash/hives, skin peeling, or any reaction on the inside of your mouth or nose? Unknown Did you need to seek medical attention at a hospital or doctor's office? Unknown When did it last happen?  childhood     If all above answers are "NO", may proceed with cephalosporin use. Childhood reaction        Medication List     STOP taking these medications    famotidine 20 MG tablet Commonly known as: Pepcid   insulin  aspart 100 UNIT/ML FlexPen Commonly known as: NOVOLOG   insulin NPH Human 100 UNIT/ML injection Commonly known as: HumuLIN N       TAKE these medications    Accu-Chek Guide Me w/Device Kit See admin instructions.   Accu-Chek Guide test strip Generic drug: glucose blood To check blood sugars 4 times a day, fasting and 2 hours after the start of breakfast, lunch and dinner.   Accu-Chek Softclix Lancets lancets To check blood sugars 4 times a day, fasting and 2 hours after the start of breakfast, lunch and dinner   Blood Pressure Kit Devi 1 Device by Does not apply route as needed.   Butalbital-APAP-Caffeine 50-325-40 MG capsule Take 1-2 capsules by mouth every 6 (six) hours as needed for headache.   cyclobenzaprine 10 MG tablet Commonly known as: FLEXERIL Take 1 tablet (10 mg total) by mouth every 8 (eight) hours as needed for muscle spasms.   ferrous sulfate 325 (65 FE) MG tablet Commonly known as: FerrouSul Take 1 tablet (325 mg total) by mouth every other day.   hydrocortisone 2.5%-nystatin-zinc oxide 20% 1:1:1 ointment mixture Apply topically 2 (two) times daily.   ibuprofen 600 MG tablet Commonly known as: ADVIL Take 1 tablet (600 mg total) by mouth every 6 (six) hours as needed for mild pain or moderate pain.   Insulin Syringe-Needle U-100 30G X 15/64" 1 ML Misc 1 Device by Does not apply route 3 (three) times daily.   metFORMIN 500 MG tablet Commonly known as: GLUCOPHAGE Take 1 tablet (500 mg total) by mouth daily with supper.   NIFEdipine 30 MG 24 hr tablet Commonly known as: ADALAT CC Take 1 tablet (30 mg total) by mouth daily.   Prenatal Complete 14-0.4 MG Tabs Take 1 tablet daily         Discharge home in stable condition Infant Feeding: Bottle and Breast Infant Disposition:home with mother Discharge instruction: per After Visit Summary and Postpartum booklet. Activity: Advance as tolerated. Pelvic rest for 6 weeks.  Diet: carb modified  diet Future Appointments: Future Appointments  Date Time Provider [place]  02/21/2021  2:00 PM [facility] NURSE [facility]  03/28/2021 10:55 AM Virginia Rochester, NP [facility]  04/25/2021 10:10 AM Pieter Partridge, DO [facility] None   Follow up Visit:   Please schedule this patient for a In person postpartum visit in 4 weeks with the following provider: Any provider. Additional Postpartum F/U:2 hour GTT and BP check 1 week  High risk pregnancy complicated by: GDM and HTN Delivery mode:  Vaginal, Spontaneous  Anticipated Birth Control: Interval BTL   02/16/2021 Verita Schneiders, MD

## 2021-02-15 NOTE — Progress Notes (Signed)
Epidural catheter removed. Carmelina Dane, RN

## 2021-02-15 NOTE — Anesthesia Postprocedure Evaluation (Signed)
Anesthesia Post Note  Patient: Katherine Maldonado  Procedure(s) Performed: AN AD HOC LABOR EPIDURAL     Patient location during evaluation: [facility] Anesthesia Type: Epidural Level of consciousness: awake and alert, oriented and patient cooperative Pain management: pain level controlled Vital Signs Assessment: post-procedure vital signs reviewed and stable Respiratory status: spontaneous breathing Cardiovascular status: stable Postop Assessment: no headache, epidural receding, patient able to bend at knees and no signs of nausea or vomiting Anesthetic complications: no Comments: Pt. States pain score 0.    No notable events documented.  Last Vitals:  Vitals:   02/15/21 0630 02/15/21 0812  BP: (!) 140/91 115/81  Pulse: (!) 107 (!) 112  Resp: 20   Temp: 36.8 C 36.7 C  SpO2: 97% 95%    Last Pain:  Vitals:   02/15/21 0730  TempSrc:   PainSc: 5    Pain Goal: Patients Stated Pain Goal: 5 (02/15/21 0730)                 Merrilyn Puma

## 2021-02-15 NOTE — Lactation Note (Signed)
This note was copied from a baby's chart. Lactation Consultation Note Mom holding baby. Mom getting blood. Mom stated the baby is getting formula right now because she feels so bad. Mom stated she does want to try to BF but not right now. Mom stated she is having her tubes tide this afternoon. Maybe late this evening she might feel like trying to BF.  Mom's back is hurting at this moment. Mom not interested in pumping until later, mom stated tomorrow. Lactation brochure given.  Patient Name: Katherine Maldonado Today's Date: 02/15/2021 Reason for consult: Initial assessment;Primapara;Early term 37-38.6wks Age:48 hours  Maternal Data Has patient been taught Hand Expression?: No  Feeding Mother's Current Feeding Choice: Breast Milk and Formula Nipple Type: Slow - flow  LATCH Score                    Lactation Tools Discussed/Used    Interventions    Discharge    Consult Status Consult Status: Follow-up Date: 02/15/21 (late PM!) Follow-up type: In-patient    Charyl Dancer 02/15/2021, 6:51 AM

## 2021-02-16 LAB — GLUCOSE, CAPILLARY
Glucose-Capillary: 102 mg/dL — ABNORMAL HIGH (ref 70–99)
Glucose-Capillary: 120 mg/dL — ABNORMAL HIGH (ref 70–99)
Glucose-Capillary: 151 mg/dL — ABNORMAL HIGH (ref 70–99)

## 2021-02-16 MED ORDER — METFORMIN HCL 500 MG PO TABS: 500.0000 mg | ORAL_TABLET | Freq: Every day | ORAL | 1 refills | Status: DC

## 2021-02-16 MED ORDER — FERROUS SULFATE 325 (65 FE) MG PO TABS: 325.0000 mg | ORAL_TABLET | ORAL | 3 refills | Status: DC

## 2021-02-16 MED ORDER — NIFEDIPINE ER 30 MG PO TB24: 30.0000 mg | ORAL_TABLET | Freq: Every day | ORAL | 1 refills | Status: DC

## 2021-02-16 MED ORDER — IBUPROFEN 600 MG PO TABS: 600.0000 mg | ORAL_TABLET | Freq: Four times a day (QID) | ORAL | 2 refills | Status: DC | PRN

## 2021-02-16 NOTE — Lactation Note (Signed)
This note was copied from a baby's chart. Lactation Consultation Note  Patient Name: Katherine Maldonado [id number] Date: 02/16/2021 Reason for consult: Follow-up assessment;Early term 37-38.6wks;Maternal endocrine disorder Age:36 hours  P1, [redacted]w[redacted]d.  Mother states she plans to attempt breastfeeding when home since she will be more comfortable.  Mother states she is only formula feeding her in the hosptial. Reviewed engorgement care and monitoring voids/stools. Encouraged breastfeeding or pumping at least 8 times per day once home if she desires full milk supply.   Feeding Mother's Current Feeding Choice: Formula Nipple Type: Slow - flow   Lactation Tools Discussed/Used Tools: Pump Breast pump type: Double-Electric Breast Pump  Interventions Interventions: Education  Discharge Discharge Education: Engorgement and breast care;Warning signs for feeding baby Pump: DEBP  Consult Status Consult Status: Complete Date: 02/16/21    Dahlia Byes [facility] 02/16/2021, 9:32 AM

## 2021-02-16 NOTE — Plan of Care (Signed)
  Problem: Education: Goal: Knowledge of General Education information will improve Description: Including pain rating scale, medication(s)/side effects and non-pharmacologic comfort measures Outcome: Adequate for Discharge   Problem: Health Behavior/Discharge Planning: Goal: Ability to manage health-related needs will improve Outcome: Adequate for Discharge   Problem: Clinical Measurements: Goal: Ability to maintain clinical measurements within normal limits will improve Outcome: Adequate for Discharge Goal: Will remain free from infection Outcome: Adequate for Discharge Goal: Diagnostic test results will improve Outcome: Adequate for Discharge Goal: Respiratory complications will improve Outcome: Adequate for Discharge Goal: Cardiovascular complication will be avoided Outcome: Adequate for Discharge   Problem: Activity: Goal: Risk for activity intolerance will decrease Outcome: Adequate for Discharge   Problem: Nutrition: Goal: Adequate nutrition will be maintained Outcome: Adequate for Discharge   Problem: Coping: Goal: Level of anxiety will decrease Outcome: Adequate for Discharge   Problem: Elimination: Goal: Will not experience complications related to bowel motility Outcome: Adequate for Discharge Goal: Will not experience complications related to urinary retention Outcome: Adequate for Discharge   Problem: Pain Managment: Goal: General experience of comfort will improve Outcome: Adequate for Discharge   Problem: Safety: Goal: Ability to remain free from injury will improve Outcome: Adequate for Discharge   Problem: Skin Integrity: Goal: Risk for impaired skin integrity will decrease Outcome: Adequate for Discharge   Problem: Education: Goal: Ability to demonstrate appropriate child care will improve Outcome: Adequate for Discharge Goal: Ability to verbalize an understanding of newborn treatment and procedures will improve Outcome: Adequate for  Discharge Goal: Ability to demonstrate an understanding of appropriate nutrition and feeding will improve Outcome: Adequate for Discharge Goal: Individualized Educational Video(s) Outcome: Adequate for Discharge   Problem: Nutritional: Goal: Nutritional status of the infant will improve as evidenced by minimal weight loss and appropriate weight gain for gestational age Outcome: Adequate for Discharge Goal: Ability to maintain a balanced intake and output will improve Outcome: Adequate for Discharge   Problem: Clinical Measurements: Goal: Ability to maintain clinical measurements within normal limits will improve Outcome: Adequate for Discharge   Problem: Skin Integrity: Goal: Risk for impaired skin integrity will decrease Outcome: Adequate for Discharge Goal: Demonstrates signs of wound healing without infection Outcome: Adequate for Discharge

## 2021-02-21 ENCOUNTER — Other Ambulatory Visit: Payer: Self-pay

## 2021-02-21 ENCOUNTER — Ambulatory Visit (INDEPENDENT_AMBULATORY_CARE_PROVIDER_SITE_OTHER): Payer: Medicaid Other

## 2021-02-21 VITALS — BP 122/86 | HR 104 | Ht 67.0 in | Wt 219.1 lb

## 2021-02-21 DIAGNOSIS — R030 Elevated blood-pressure reading, without diagnosis of hypertension: Secondary | ICD-10-CM

## 2021-02-21 NOTE — Progress Notes (Signed)
Agree with A & P. 

## 2021-02-21 NOTE — Progress Notes (Signed)
Pt here today for BP check x 1 week PP. Pt had NSVD on 02/15/21. Pt had PP MGSO4 due to elevated BP. Pt denies any headaches, swelling or visual changes at this time. Pt has been taking BP med- Procardia 30mg  as directed.   BP: 122/86  Pt advised to continue taking Procardia as directed until PP visit. Pt has PP visit with 2hr PP GTT with Dr on 8/22 and will discuss interval BTL as well.  Pt requesting to see 9/22 for education on healthy eating as well. Appt made for 8/18. Pt verbalized understanding to plan of care and agreeable to all appts and dates.   9/18, RN

## 2021-02-27 ENCOUNTER — Telehealth (HOSPITAL_COMMUNITY): Payer: Self-pay

## 2021-02-27 ENCOUNTER — Other Ambulatory Visit: Payer: Self-pay | Admitting: *Deleted

## 2021-02-27 DIAGNOSIS — Z1331 Encounter for screening for depression: Secondary | ICD-10-CM

## 2021-02-27 DIAGNOSIS — O24429 Gestational diabetes mellitus in childbirth, unspecified control: Secondary | ICD-10-CM

## 2021-02-27 NOTE — Telephone Encounter (Signed)
Patient doing well, pumping and providing breast milk, to see lactation 02/28/21.  Baby doing well, no concerns. EPDS 11 with referral placed.

## 2021-02-28 ENCOUNTER — Other Ambulatory Visit: Payer: Self-pay

## 2021-02-28 ENCOUNTER — Ambulatory Visit: Payer: Medicaid Other | Admitting: Registered"

## 2021-02-28 ENCOUNTER — Encounter: Payer: Medicaid Other | Attending: Obstetrics and Gynecology | Admitting: Registered"

## 2021-02-28 DIAGNOSIS — Z3A00 Weeks of gestation of pregnancy not specified: Secondary | ICD-10-CM | POA: Diagnosis not present

## 2021-02-28 DIAGNOSIS — E669 Obesity, unspecified: Secondary | ICD-10-CM

## 2021-02-28 DIAGNOSIS — O24419 Gestational diabetes mellitus in pregnancy, unspecified control: Secondary | ICD-10-CM | POA: Insufficient documentation

## 2021-02-28 NOTE — Progress Notes (Signed)
Medical Nutrition Therapy  Appointment Start time:  [phone]  Appointment End time:  [phone]  Primary concerns today: wants to eat healthy during lactation, wants to stop taking metformin, also wants to be able to lose weight  Referral diagnosis: e66.9 (Obesity) Preferred learning style: no preference indicated Learning readiness: ready   NUTRITION ASSESSMENT   Anthropometrics  Not assessed   Clinical Medical Hx: GDM, Patient is postpartum, delivery at [redacted]w[redacted]d. and currently breastfeeding (pump/breast/formula) Medications: reviewed Labs: reviewed Notable Signs/Symptoms: none  Lifestyle & Dietary Hx Pt reports she has lost 20 lbs since giving birth and would like to lose 20 more to get down to 200 lbs.   Pt would like ideas for quick, convenient and nutritious meals. Pt reports she plans to return to work soon but will be working from home. Patient states she has a small refrigerator/freezer so cannot buy a lot of frozen meals.   Patient states she is having some difficulty with breastfeeding and is working with lactation specialist.  Estimated daily fluid intake: not assessed Supplements: not assessed Sleep: not assessed Stress / self-care: not assessed Current average weekly physical activity: not assessedd  24-Hr Dietary Recall First Meal: protein shake Snack:  Second Meal: peaches and cottage cheese Snack:  Third Meal:  Snack:  Beverages: Coconut Body Armour, water  Estimated Energy Needs Calories: 2100   NUTRITION DIAGNOSIS  NB-1.1 Food and nutrition-related knowledge deficit As related to ways to get balanced meals and convenience at same time.  As evidenced by dietary recall and patient stated needing ideas of what to eat.   NUTRITION INTERVENTION  Nutrition education (E-1) on the following topics:  Vegetable options that are quick and easy Meals easy to cook and have on hand Potential for hypoglycemia during breastfeeding  Handouts Provided Include   none  Learning Style & Readiness for Change Teaching method utilized: Visual & Auditory  Demonstrated degree of understanding via: Teach Back  Barriers to learning/adherence to lifestyle change: none  Goals Established by Pt Some ideas to help get nutrition  Continue protein shakes and cottage cheese with fruit Easy and fast meal ideas: Tacos, then use same pan to cook burgers to heat up later Cut up vegetables to put in microwave safe dish with a little water and cook 3-4 minutes. Have raw vegetables and dip for snacks Refrigerated meals can be found at [employer] and [employer] Frozen meals  Tuna fish / frozen salmon patties (limit to 2x/week while breastfeeding) Have on hand some egg salad to put on crackers or bread. Nuts and fruit together are good snacks Drinking the coconut body armour before pumping is probably a good idea.  Since you have a goal to stop taking metformin consider checking your blood sugar often before your next visit with MD and discuss with them if it is still necessary.  MONITORING & EVALUATION Dietary intake, weekly physical activity, and weight prn.  Next Steps  Patient is to work on eating balanced meals and snacks and staying hydrated.

## 2021-02-28 NOTE — Patient Instructions (Signed)
Some ideas to help get nutrition  Continue protein shakes and cottage cheese with fruit Easy and fast meal ideas: Tacos, then use same pan to cook burgers to heat up later Cut up vegetables to put in microwave safe dish with a little water and cook 3-4 minutes. Have raw vegetables and dip for snacks Refrigerated meals can be found at [employer] and [employer] Frozen meals  Tuna fish / frozen salmon patties (limit to 2x/week while breastfeeding) Have on hand some egg salad to put on crackers or bread.  Nuts and fruit together are good snacks  Drinking the coconut body armour before pumping is probably a good idea.  Since you have a goal to stop taking metformin consider checking your blood sugar often before your next visit with MD and discuss with them if it is still necessary.

## 2021-03-04 ENCOUNTER — Other Ambulatory Visit: Payer: Self-pay

## 2021-03-04 ENCOUNTER — Ambulatory Visit (INDEPENDENT_AMBULATORY_CARE_PROVIDER_SITE_OTHER): Payer: Medicaid Other | Admitting: Obstetrics and Gynecology

## 2021-03-04 ENCOUNTER — Other Ambulatory Visit: Payer: Medicaid Other

## 2021-03-04 ENCOUNTER — Other Ambulatory Visit (HOSPITAL_COMMUNITY)
Admission: RE | Admit: 2021-03-04 | Discharge: 2021-03-04 | Disposition: A | Discharge status: Home / Self Care | Payer: Medicaid Other | Source: Ambulatory Visit | Attending: Obstetrics and Gynecology | Admitting: Obstetrics and Gynecology

## 2021-03-04 VITALS — BP 119/79 | HR 76 | Wt 215.0 lb

## 2021-03-04 DIAGNOSIS — Z124 Encounter for screening for malignant neoplasm of cervix: Secondary | ICD-10-CM

## 2021-03-04 NOTE — Progress Notes (Signed)
Post Partum Visit Note  Katherine Maldonado is a 35 y.o. G49P1001 female who presents for a postpartum visit. She is 2 weeks postpartum following a vacuum-assisted vaginal delivery.  I have fully reviewed the prenatal and intrapartum course. The delivery was at [redacted]w[redacted]d gestational weeks.  Anesthesia: epidural. Postpartum course has been normal. Baby is doing well. Baby is feeding by both breast and bottle - Similac 360 sensitive . Bleeding no bleeding. Bowel function is normal. Bladder function is normal. Patient is not sexually active. Contraception method is none. Postpartum depression screening: negative.   The pregnancy intention screening data noted above was reviewed. Potential methods of contraception were discussed. The patient elected to proceed with No data recorded.   Edinburgh Postnatal Depression Scale - 03/04/21 0946       Edinburgh Postnatal Depression Scale:  In the Past 7 Days   I have been able to laugh and see the funny side of things. 0    I have looked forward with enjoyment to things. 0    I have blamed myself unnecessarily when things went wrong. 0    I have been anxious or worried for no good reason. 0    I have felt scared or panicky for no good reason. 0    Things have been getting on top of me. 0    I have been so unhappy that I have had difficulty sleeping. 1    I have felt sad or miserable. 0    I have been so unhappy that I have been crying. 0    The thought of harming myself has occurred to me. 0    Edinburgh Postnatal Depression Scale Total 1             Health Maintenance Due  Topic Date Due   URINE MICROALBUMIN  Never done   PAP SMEAR-Modifier  Never done   COVID-19 Vaccine (3 - Booster for Pfizer series) 11/14/2020   INFLUENZA VACCINE  02/11/2021    The following portions of the patient's history were reviewed and updated as appropriate: allergies, current medications, past family history, past medical history, past social history, past  surgical history, and problem list.  Review of Systems Pertinent items are noted in HPI.  Objective:  BP 119/79   Pulse 76   Wt 215 lb (97.5 kg)   LMP  (LMP Unknown)   BMI 33.67 kg/m    General:  alert, cooperative, no distress, and mildly obese   Breasts:  not indicated  Lungs: clear to auscultation bilaterally  Heart:  regular rate and rhythm  Abdomen: soft, non-tender; bowel sounds normal; no masses,  no organomegaly   Wound N/A  GU exam:  normal       Assessment:    normal postpartum exam.   Plan:   Essential components of care per ACOG recommendations:  1.  Mood and well being: Patient with negative depression screening today. Reviewed local resources for support.  - Patient tobacco use? No.   - hx of drug use? Yes. Discussed support systems and outpatient/inpatient treatment options.    2. Infant care and feeding:  -Patient currently breastmilk feeding? Yes. Reviewed importance of draining breast regularly to support lactation.  -Social determinants of health (SDOH) reviewed in EPIC. No concerns.  3. Sexuality, contraception and birth spacing - Patient does not want a pregnancy in the next year.  Desired family size is 1 children.  - Reviewed forms of contraception in tiered fashion. Patient desired bilateral  tubal ligation today.  Will schedule for interval tubal late September early October if possible. - Discussed birth spacing of 18 months  4. Sleep and fatigue -Encouraged family/partner/community support of 4 hrs of uninterrupted sleep to help with mood and fatigue  5. Physical Recovery  - Discussed patients delivery and complications. She describes her labor as mixed. - Patient had a Vaginal, no problems at delivery. Patient had   no  laceration. Perineal healing reviewed. Patient expressed understanding - Patient has urinary incontinence? No. - Patient is safe to resume physical and sexual activity  6.  Health Maintenance - HM due items addressed  Yes - Last pap smear unknown Pap smear done at today's visit.  -Breast Cancer screening indicated? No.   7. Chronic Disease/Pregnancy Condition follow up: Gestational Diabetes Pt needs to get her postpartum 2 hour GTT done, it is too early today, and she may not be able to return soon due to work schedule.  May try to reschedule at time of preop.  Pt advised to continue metformin for 2 more weeks then discontinue.  She is to alert [place] if blood sugars rise after discontinuing.   Warden Fillers, MD Center for [employer], [facility]

## 2021-03-07 ENCOUNTER — Other Ambulatory Visit: Payer: Self-pay | Admitting: Lactation Services

## 2021-03-07 LAB — CYTOLOGY - PAP
Comment: NEGATIVE
Diagnosis: NEGATIVE
Diagnosis: REACTIVE
High risk HPV: NEGATIVE

## 2021-03-07 MED ORDER — PRENATAL 27-1 MG PO TABS: 1.0000 | ORAL_TABLET | Freq: Every day | ORAL | 11 refills | Status: AC

## 2021-03-19 ENCOUNTER — Telehealth: Payer: Self-pay | Admitting: *Deleted

## 2021-03-19 ENCOUNTER — Encounter: Payer: Self-pay | Admitting: *Deleted

## 2021-03-19 NOTE — Telephone Encounter (Signed)
See My Chart messages from patient. Attempt to call patient X 2 with no completed (audible) connection. Sent MyChart message back to patient with surgery date and will re-attempt to connect via phone for review of instructions.

## 2021-03-28 ENCOUNTER — Ambulatory Visit: Payer: Medicaid Other | Admitting: Nurse Practitioner

## 2021-04-08 ENCOUNTER — Other Ambulatory Visit (INDEPENDENT_AMBULATORY_CARE_PROVIDER_SITE_OTHER): Payer: Medicaid Other | Admitting: Obstetrics and Gynecology

## 2021-04-08 DIAGNOSIS — Z8632 Personal history of gestational diabetes: Secondary | ICD-10-CM

## 2021-04-08 DIAGNOSIS — Z01818 Encounter for other preprocedural examination: Secondary | ICD-10-CM

## 2021-04-08 NOTE — Progress Notes (Signed)
Pre op labs entered, pt to get 2 hour GTT 04/22/21

## 2021-04-08 NOTE — Telephone Encounter (Signed)
Spoke to pre-services center- due to work schedule and child care issues, will schedule hospital pre-op labs with office pre-op.   Encounter closed.

## 2021-04-10 ENCOUNTER — Telehealth: Payer: Self-pay | Admitting: *Deleted

## 2021-04-10 NOTE — Progress Notes (Signed)
Pt called has no driver for [id number] surgery will call us back if has driver and caregiver available.

## 2021-04-10 NOTE — Telephone Encounter (Signed)
Message from surgery center- patient has questions about appointment next week. Appointments are not showing for patient is My Chart- viewed appt desk and screen does look different than normal. Will enter IT ticket. Reviewed appointment times and plan for 2 hour GTT and pre-op labs with appointment with MD. Katherine Maldonado of post-operative recovery recommendations-  no driving for 24 hours and should have assistance with child care as cognition in affected. Patient will make arrangements for help from neighbor.  Encounter closed.

## 2021-04-16 NOTE — Addendum Note (Signed)
Addended by: Warden Fillers on: 04/16/2021 03:36 PM   Modules accepted: Orders, SmartSet

## 2021-04-17 ENCOUNTER — Encounter (HOSPITAL_BASED_OUTPATIENT_CLINIC_OR_DEPARTMENT_OTHER): Payer: Self-pay | Admitting: Obstetrics and Gynecology

## 2021-04-17 ENCOUNTER — Other Ambulatory Visit: Payer: Self-pay

## 2021-04-17 DIAGNOSIS — R200 Anesthesia of skin: Secondary | ICD-10-CM

## 2021-04-17 HISTORY — DX: Anesthesia of skin: R20.0

## 2021-04-17 NOTE — Progress Notes (Addendum)
Spoke w/ via phone for pre-op interview---pt Labs needed dos: urine preg t & s  Lab results------cbc 04-22-2021 epic COVID test -----patient states asymptomatic no test needed Arrive at -------630 am 04-24-2021 NPO after MN NO Solid Food.  Clear liquids from MN until---530 am Med rec completed Medications to take morning of surgery -----none Diabetic medication ----- Patient instructed no nail polish to be worn day of surgery Patient instructed to bring photo id and insurance card day of surgery Patient aware to have Driver (ride ) / caregiver   friend sasha connor driver/caregiver daughter's father for 24 hours after surgery  Patient Special Instructions -----none Pre-Op special Istructions -----req orders dr bass epic ib (orders in chart as of 04-23-2021) Patient verbalized understanding of instructions that were given at this phone interview. Patient denies shortness of breath, chest pain, fever, cough at this phone interview.

## 2021-04-19 ENCOUNTER — Encounter: Payer: Self-pay | Admitting: Radiology

## 2021-04-22 ENCOUNTER — Other Ambulatory Visit: Payer: Self-pay

## 2021-04-22 ENCOUNTER — Encounter: Payer: Self-pay | Admitting: Obstetrics and Gynecology

## 2021-04-22 ENCOUNTER — Ambulatory Visit (INDEPENDENT_AMBULATORY_CARE_PROVIDER_SITE_OTHER): Payer: Medicaid Other | Admitting: Obstetrics and Gynecology

## 2021-04-22 VITALS — BP 113/80 | HR 75 | Wt 223.6 lb

## 2021-04-22 DIAGNOSIS — Z01818 Encounter for other preprocedural examination: Secondary | ICD-10-CM | POA: Insufficient documentation

## 2021-04-22 DIAGNOSIS — Z3049 Encounter for surveillance of other contraceptives: Secondary | ICD-10-CM

## 2021-04-22 DIAGNOSIS — Z8632 Personal history of gestational diabetes: Secondary | ICD-10-CM

## 2021-04-22 DIAGNOSIS — O09519 Supervision of elderly primigravida, unspecified trimester: Secondary | ICD-10-CM

## 2021-04-22 HISTORY — DX: Encounter for other preprocedural examination: Z01.818

## 2021-04-22 NOTE — Progress Notes (Signed)
Patient is here for pre op exam for tubal this upcoming Wednesday

## 2021-04-22 NOTE — H&P (View-Only) (Signed)
OB/GYN Pre-Op History and Physical  CHARRON COULTAS is a 35 y.o. G1P1001 presenting for preoperative appointment.  Patient counseled regarding bilateral tubal ligation. Reviewed that this is a permanent procedure and that she will not be able to have children after it is done. Reviewed risks of bilateral tubal ligation including infection, hemorrhage, damage to surrounding tissue and organs, risk of regret. Reviewed that bilateral tubal ligation is not 100% effective and she should take a pregnancy test if she believes for any reason she may be pregnant. Reviewed slightly increased risk of ectopic pregnancy and need to seek care if she becomes pregnant. She understands this is an elective procedure and again affirms her desire. Consent signed, also consents to blood transfusion if necessary.         Past Medical History:  Diagnosis Date   Anxiety    Back pain    Depression    History of gestational diabetes    none since 02-14-2021 childbirth   History of pre-eclampsia    none since 02-14-2021 childbirth   Migraines    Numbness 04/17/2021   and burning of left thigh at times   Wears contact lenses     Past Surgical History:  Procedure Laterality Date   TOOTH EXTRACTION     last done 2020    OB History  Gravida Para Term Preterm AB Living  1 1 1     1   SAB IAB Ectopic Multiple Live Births        0 1    # Outcome Date GA Lbr Len/2nd Weight Sex Delivery Anes PTL Lv  1 Term 02/14/21 [redacted]w[redacted]d 03:42 / 01:08 7 lb 8.6 oz (3.419 kg) F Vag-Spont EPI  LIV    Social History   Socioeconomic History   Marital status: Divorced    Spouse name: Not on file   Number of children: Not on file   Years of education: Not on file   Highest education level: Not on file  Occupational History   Not on file  Tobacco Use   Smoking status: Former    Types: Cigarettes   Smokeless tobacco: Never   Tobacco comments:    when teenager smoked cigarettes, vaping with flavoring in 2021 until found out  pregnant  Vaping Use   Vaping Use: Former  Substance and Sexual Activity   Alcohol use: Yes    Comment: occasionally   Drug use: Not Currently    Types: Marijuana    Comment: marijuana last used October 2021   Sexual activity: Yes    Birth control/protection: None  Other Topics Concern   Not on file  Social History Narrative   Right handed   Drinks caffeine   One story home   Social Determinants of Health   Financial Resource Strain: Not on file  Food Insecurity: No Food Insecurity   Worried About November 2021 in the Last Year: Never true   Ran Out of Food in the Last Year: Never true  Transportation Needs: No Transportation Needs   Lack of Transportation (Medical): No   Lack of Transportation (Non-Medical): No  Physical Activity: Not on file  Stress: Not on file  Social Connections: Not on file    Family History  Adopted: Yes  Problem Relation Age of Onset   Thyroid disease Mother    COPD Mother    Diabetes Father    Hypertension Father    Thrombocytopenia Father     (Not in a hospital admission)  Allergies  Allergen Reactions   Latex Swelling and Rash   Penicillins Rash and Other (See Comments)    Did it involve swelling of the face/tongue/throat, SOB, or low BP? Unknown Did it involve sudden or severe rash/hives, skin peeling, or any reaction on the inside of your mouth or nose? Unknown Did you need to seek medical attention at a hospital or doctor's office? Unknown When did it last happen?  childhood     If all above answers are "NO", may proceed with cephalosporin use.   Childhood reaction     Review of Systems: Negative except for what is mentioned in HPI.     Physical Exam: BP 113/80   Pulse 75   Wt 223 lb 9.6 oz (101.4 kg)   LMP 06/04/2020 (Approximate)   Breastfeeding Yes   BMI 35.02 kg/m  CONSTITUTIONAL: Well-developed, obese female in no acute distress.  HENT:  Normocephalic, atraumatic, External right and left ear normal.  Oropharynx is clear and moist EYES: Conjunctivae and EOM are normal.  NECK: Normal range of motion, supple, no masses SKIN: Skin is warm and dry. No rash noted. Not diaphoretic. No erythema. No pallor. NEUROLGIC: Alert and oriented to person, place, and time. Normal reflexes, muscle tone coordination. No cranial nerve deficit noted. PSYCHIATRIC: Normal mood and affect. Normal behavior. Normal judgment and thought content. CARDIOVASCULAR: Normal heart rate noted, regular rhythm RESPIRATORY: Effort and breath sounds normal, no problems with respiration noted ABDOMEN: Soft, nontender, nondistended, obese PELVIC: Deferred MUSCULOSKELETAL: Normal range of motion. No edema and no tenderness. 2+ distal pulses.   Pertinent Labs/Studies:   No results found for this or any previous visit (from the past 72 hour(s)).     Assessment and Plan :KINSLY HILD is a 35 y.o. G1P1001 here for preoperative visit for laparoscopic bilateral salpingectomy.   Plan for lap BTL with salpingectomy NPO Admission labs ordered Late 2 hour GTT ordered, if positive possible EKG before surgery Pt understands this is an elective procedure with risks of bleeding, infection, involvement of other organs.  Pt wishes to proceed.   Mariel Aloe, M.D. Attending Obstetrician & Gynecologist, [facility] for [employer], [facility]

## 2021-04-22 NOTE — Progress Notes (Signed)
OB/GYN Pre-Op History and Physical  SCARLET ABAD is a 35 y.o. G1P1001 presenting for preoperative appointment.  Patient counseled regarding bilateral tubal ligation. Reviewed that this is a permanent procedure and that she will not be able to have children after it is done. Reviewed risks of bilateral tubal ligation including infection, hemorrhage, damage to surrounding tissue and organs, risk of regret. Reviewed that bilateral tubal ligation is not 100% effective and she should take a pregnancy test if she believes for any reason she may be pregnant. Reviewed slightly increased risk of ectopic pregnancy and need to seek care if she becomes pregnant. She understands this is an elective procedure and again affirms her desire. Consent signed, also consents to blood transfusion if necessary.         Past Medical History:  Diagnosis Date   Anxiety    Back pain    Depression    History of gestational diabetes    none since 02-14-2021 childbirth   History of pre-eclampsia    none since 02-14-2021 childbirth   Migraines    Numbness 04/17/2021   and burning of left thigh at times   Wears contact lenses     Past Surgical History:  Procedure Laterality Date   TOOTH EXTRACTION     last done 2020    OB History  Gravida Para Term Preterm AB Living  1 1 1     1   SAB IAB Ectopic Multiple Live Births        0 1    # Outcome Date GA Lbr Len/2nd Weight Sex Delivery Anes PTL Lv  1 Term 02/14/21 [redacted]w[redacted]d 03:42 / 01:08 7 lb 8.6 oz (3.419 kg) F Vag-Spont EPI  LIV    Social History   Socioeconomic History   Marital status: Divorced    Spouse name: Not on file   Number of children: Not on file   Years of education: Not on file   Highest education level: Not on file  Occupational History   Not on file  Tobacco Use   Smoking status: Former    Types: Cigarettes   Smokeless tobacco: Never   Tobacco comments:    when teenager smoked cigarettes, vaping with flavoring in 2021 until found out  pregnant  Vaping Use   Vaping Use: Former  Substance and Sexual Activity   Alcohol use: Yes    Comment: occasionally   Drug use: Not Currently    Types: Marijuana    Comment: marijuana last used October 2021   Sexual activity: Yes    Birth control/protection: None  Other Topics Concern   Not on file  Social History Narrative   Right handed   Drinks caffeine   One story home   Social Determinants of Health   Financial Resource Strain: Not on file  Food Insecurity: No Food Insecurity   Worried About November 2021 in the Last Year: Never true   Ran Out of Food in the Last Year: Never true  Transportation Needs: No Transportation Needs   Lack of Transportation (Medical): No   Lack of Transportation (Non-Medical): No  Physical Activity: Not on file  Stress: Not on file  Social Connections: Not on file    Family History  Adopted: Yes  Problem Relation Age of Onset   Thyroid disease Mother    COPD Mother    Diabetes Father    Hypertension Father    Thrombocytopenia Father     (Not in a hospital admission)  Allergies  Allergen Reactions   Latex Swelling and Rash   Penicillins Rash and Other (See Comments)    Did it involve swelling of the face/tongue/throat, SOB, or low BP? Unknown Did it involve sudden or severe rash/hives, skin peeling, or any reaction on the inside of your mouth or nose? Unknown Did you need to seek medical attention at a hospital or doctor's office? Unknown When did it last happen?  childhood     If all above answers are "NO", may proceed with cephalosporin use.   Childhood reaction     Review of Systems: Negative except for what is mentioned in HPI.     Physical Exam: BP 113/80   Pulse 75   Wt 223 lb 9.6 oz (101.4 kg)   LMP 06/04/2020 (Approximate)   Breastfeeding Yes   BMI 35.02 kg/m  CONSTITUTIONAL: Well-developed, obese female in no acute distress.  HENT:  Normocephalic, atraumatic, External right and left ear normal.  Oropharynx is clear and moist EYES: Conjunctivae and EOM are normal.  NECK: Normal range of motion, supple, no masses SKIN: Skin is warm and dry. No rash noted. Not diaphoretic. No erythema. No pallor. NEUROLGIC: Alert and oriented to person, place, and time. Normal reflexes, muscle tone coordination. No cranial nerve deficit noted. PSYCHIATRIC: Normal mood and affect. Normal behavior. Normal judgment and thought content. CARDIOVASCULAR: Normal heart rate noted, regular rhythm RESPIRATORY: Effort and breath sounds normal, no problems with respiration noted ABDOMEN: Soft, nontender, nondistended, obese PELVIC: Deferred MUSCULOSKELETAL: Normal range of motion. No edema and no tenderness. 2+ distal pulses.   Pertinent Labs/Studies:   No results found for this or any previous visit (from the past 72 hour(s)).     Assessment and Plan :KINSLY HILD is a 35 y.o. G1P1001 here for preoperative visit for laparoscopic bilateral salpingectomy.   Plan for lap BTL with salpingectomy NPO Admission labs ordered Late 2 hour GTT ordered, if positive possible EKG before surgery Pt understands this is an elective procedure with risks of bleeding, infection, involvement of other organs.  Pt wishes to proceed.   Mariel Aloe, M.D. Attending Obstetrician & Gynecologist, [facility] for [employer], [facility]

## 2021-04-23 LAB — GLUCOSE TOLERANCE, 2 HOURS
Glucose, 2 hour: 117 mg/dL (ref 70–139)
Glucose, GTT - Fasting: 116 mg/dL — ABNORMAL HIGH (ref 70–99)

## 2021-04-23 LAB — CBC
Hematocrit: 37.8 % (ref 34.0–46.6)
Hemoglobin: 11.9 g/dL (ref 11.1–15.9)
MCH: 26.6 pg (ref 26.6–33.0)
MCHC: 31.5 g/dL (ref 31.5–35.7)
MCV: 85 fL (ref 79–97)
Platelets: 491 10*3/uL — ABNORMAL HIGH (ref 150–450)
RBC: 4.47 x10E6/uL (ref 3.77–5.28)
RDW: 14.5 % (ref 11.7–15.4)
WBC: 7.3 10*3/uL (ref 3.4–10.8)

## 2021-04-23 LAB — ABO AND RH: Rh Factor: POSITIVE

## 2021-04-23 NOTE — Anesthesia Preprocedure Evaluation (Addendum)
Anesthesia Evaluation  Patient identified by MRN, date of birth, ID band Patient awake    Reviewed: Allergy & Precautions, NPO status , Patient's Chart, lab work & pertinent test results  Airway Mallampati: III  TM Distance: >3 FB Neck ROM: Full    Dental  (+) Chipped   Pulmonary former smoker,    Pulmonary exam normal breath sounds clear to auscultation       Cardiovascular Normal cardiovascular exam Rhythm:Regular Rate:Normal     Neuro/Psych  Headaches, PSYCHIATRIC DISORDERS Anxiety Depression    GI/Hepatic negative GI ROS, Neg liver ROS,   Endo/Other    Renal/GU negative Renal ROS     Musculoskeletal negative musculoskeletal ROS (+)   Abdominal (+) + obese,   Peds  Hematology negative hematology ROS (+)   Anesthesia Other Findings Undesired fertility  Reproductive/Obstetrics hcg negative                            Anesthesia Physical Anesthesia Plan  ASA: 2  Anesthesia Plan: General   Post-op Pain Management:    Induction: Intravenous  PONV Risk Score and Plan: 4 or greater and Ondansetron, Dexamethasone, Midazolam, Scopolamine patch - Pre-op and Treatment may vary due to age or medical condition  Airway Management Planned: Oral ETT  Additional Equipment:   Intra-op Plan:   Post-operative Plan: Extubation in OR  Informed Consent: I have reviewed the patients History and Physical, chart, labs and discussed the procedure including the risks, benefits and alternatives for the proposed anesthesia with the patient or authorized representative who has indicated his/her understanding and acceptance.     Dental advisory given  Plan Discussed with: CRNA  Anesthesia Plan Comments:        Anesthesia Quick Evaluation

## 2021-04-24 ENCOUNTER — Ambulatory Visit (HOSPITAL_BASED_OUTPATIENT_CLINIC_OR_DEPARTMENT_OTHER)
Admission: RE | Admit: 2021-04-24 | Discharge: 2021-04-24 | Disposition: A | Discharge status: Home / Self Care | Payer: Medicaid Other | Attending: Obstetrics and Gynecology | Admitting: Obstetrics and Gynecology

## 2021-04-24 ENCOUNTER — Encounter (HOSPITAL_BASED_OUTPATIENT_CLINIC_OR_DEPARTMENT_OTHER): Payer: Self-pay | Admitting: Obstetrics and Gynecology

## 2021-04-24 ENCOUNTER — Encounter (HOSPITAL_BASED_OUTPATIENT_CLINIC_OR_DEPARTMENT_OTHER)
Admission: RE | Disposition: A | Discharge status: Home / Self Care | Payer: Self-pay | Source: Home / Self Care | Attending: Obstetrics and Gynecology

## 2021-04-24 ENCOUNTER — Ambulatory Visit (HOSPITAL_BASED_OUTPATIENT_CLINIC_OR_DEPARTMENT_OTHER): Payer: Medicaid Other | Admitting: Anesthesiology

## 2021-04-24 DIAGNOSIS — Z87891 Personal history of nicotine dependence: Secondary | ICD-10-CM | POA: Diagnosis not present

## 2021-04-24 DIAGNOSIS — Z91040 Latex allergy status: Secondary | ICD-10-CM | POA: Insufficient documentation

## 2021-04-24 DIAGNOSIS — Z302 Encounter for sterilization: Secondary | ICD-10-CM | POA: Diagnosis not present

## 2021-04-24 DIAGNOSIS — Z8759 Personal history of other complications of pregnancy, childbirth and the puerperium: Secondary | ICD-10-CM | POA: Diagnosis not present

## 2021-04-24 DIAGNOSIS — Z8632 Personal history of gestational diabetes: Secondary | ICD-10-CM | POA: Insufficient documentation

## 2021-04-24 DIAGNOSIS — Z880 Allergy status to penicillin: Secondary | ICD-10-CM | POA: Diagnosis not present

## 2021-04-24 HISTORY — DX: Personal history of gestational diabetes: Z86.32

## 2021-04-24 HISTORY — DX: Depression, unspecified: F32.A

## 2021-04-24 HISTORY — DX: Personal history of other complications of pregnancy, childbirth and the puerperium: Z87.59

## 2021-04-24 HISTORY — PX: LAPAROSCOPIC BILATERAL SALPINGECTOMY: SHX5889

## 2021-04-24 HISTORY — DX: Presence of spectacles and contact lenses: Z97.3

## 2021-04-24 LAB — TYPE AND SCREEN
ABO/RH(D): O POS
Antibody Screen: NEGATIVE

## 2021-04-24 LAB — POCT PREGNANCY, URINE: Preg Test, Ur: NEGATIVE

## 2021-04-24 SURGERY — SALPINGECTOMY, BILATERAL, LAPAROSCOPIC
Anesthesia: General | Site: Abdomen | Laterality: Bilateral

## 2021-04-24 MED ORDER — SUGAMMADEX SODIUM 200 MG/2ML IV SOLN
INTRAVENOUS | Status: DC | PRN
  Administered 2021-04-24: 200 mg via INTRAVENOUS

## 2021-04-24 MED ORDER — PROPOFOL 10 MG/ML IV BOLUS
INTRAVENOUS | Status: DC | PRN
  Administered 2021-04-24: 200 mg via INTRAVENOUS

## 2021-04-24 MED ORDER — BUPIVACAINE HCL 0.25 % IJ SOLN
INTRAMUSCULAR | Status: DC | PRN
  Administered 2021-04-24: 16 mL

## 2021-04-24 MED ORDER — CLINDAMYCIN PHOSPHATE 900 MG/50ML IV SOLN
900.0000 mg | Freq: Once | INTRAVENOUS | Status: AC
  Administered 2021-04-24: 900 mg via INTRAVENOUS

## 2021-04-24 MED ORDER — PROMETHAZINE HCL 25 MG/ML IJ SOLN: 6.2500 mg | INTRAMUSCULAR | Status: DC | PRN

## 2021-04-24 MED ORDER — DEXMEDETOMIDINE (PRECEDEX) IN NS 20 MCG/5ML (4 MCG/ML) IV SYRINGE
PREFILLED_SYRINGE | INTRAVENOUS | Status: AC
  Filled 2021-04-24: qty 5

## 2021-04-24 MED ORDER — MIDAZOLAM HCL 2 MG/2ML IJ SOLN
INTRAMUSCULAR | Status: AC
  Filled 2021-04-24: qty 2

## 2021-04-24 MED ORDER — SCOPOLAMINE 1 MG/3DAYS TD PT72
1.0000 | MEDICATED_PATCH | TRANSDERMAL | Status: DC
  Administered 2021-04-24: 1.5 mg via TRANSDERMAL

## 2021-04-24 MED ORDER — LIDOCAINE 2% (20 MG/ML) 5 ML SYRINGE
INTRAMUSCULAR | Status: AC
  Filled 2021-04-24: qty 5

## 2021-04-24 MED ORDER — DEXAMETHASONE SODIUM PHOSPHATE 10 MG/ML IJ SOLN
INTRAMUSCULAR | Status: AC
  Filled 2021-04-24: qty 1

## 2021-04-24 MED ORDER — ARTIFICIAL TEARS OPHTHALMIC OINT
TOPICAL_OINTMENT | OPHTHALMIC | Status: AC
  Filled 2021-04-24: qty 3.5

## 2021-04-24 MED ORDER — PHENYLEPHRINE 40 MCG/ML (10ML) SYRINGE FOR IV PUSH (FOR BLOOD PRESSURE SUPPORT)
PREFILLED_SYRINGE | INTRAVENOUS | Status: AC
  Filled 2021-04-24: qty 10

## 2021-04-24 MED ORDER — 0.9 % SODIUM CHLORIDE (POUR BTL) OPTIME
TOPICAL | Status: DC | PRN
  Administered 2021-04-24: 500 mL

## 2021-04-24 MED ORDER — FENTANYL CITRATE (PF) 100 MCG/2ML IJ SOLN
INTRAMUSCULAR | Status: AC
  Filled 2021-04-24: qty 2

## 2021-04-24 MED ORDER — OXYCODONE HCL 5 MG/5ML PO SOLN: 5.0000 mg | Freq: Once | ORAL | Status: AC | PRN

## 2021-04-24 MED ORDER — LACTATED RINGERS IV SOLN: INTRAVENOUS | Status: DC

## 2021-04-24 MED ORDER — KETOROLAC TROMETHAMINE 30 MG/ML IJ SOLN
INTRAMUSCULAR | Status: AC
  Filled 2021-04-24: qty 3

## 2021-04-24 MED ORDER — SUGAMMADEX SODIUM 200 MG/2ML IV SOLN: INTRAVENOUS | Status: DC | PRN

## 2021-04-24 MED ORDER — OXYCODONE HCL 5 MG PO TABS
5.0000 mg | ORAL_TABLET | Freq: Once | ORAL | Status: AC | PRN
  Administered 2021-04-24: 5 mg via ORAL

## 2021-04-24 MED ORDER — PHENYLEPHRINE 40 MCG/ML (10ML) SYRINGE FOR IV PUSH (FOR BLOOD PRESSURE SUPPORT)
PREFILLED_SYRINGE | INTRAVENOUS | Status: DC | PRN
  Administered 2021-04-24 (×2): 80 ug via INTRAVENOUS

## 2021-04-24 MED ORDER — ACETAMINOPHEN 500 MG PO TABS
1000.0000 mg | ORAL_TABLET | Freq: Once | ORAL | Status: AC
  Administered 2021-04-24: 1000 mg via ORAL

## 2021-04-24 MED ORDER — DEXMEDETOMIDINE (PRECEDEX) IN NS 20 MCG/5ML (4 MCG/ML) IV SYRINGE
PREFILLED_SYRINGE | INTRAVENOUS | Status: DC | PRN
  Administered 2021-04-24: 8 ug via INTRAVENOUS
  Administered 2021-04-24: 12 ug via INTRAVENOUS

## 2021-04-24 MED ORDER — OXYCODONE-ACETAMINOPHEN 5-325 MG PO TABS: 1.0000 | ORAL_TABLET | ORAL | 0 refills | Status: DC | PRN

## 2021-04-24 MED ORDER — ONDANSETRON HCL 4 MG/2ML IJ SOLN
INTRAMUSCULAR | Status: AC
  Filled 2021-04-24: qty 2

## 2021-04-24 MED ORDER — MIDAZOLAM HCL 5 MG/5ML IJ SOLN
INTRAMUSCULAR | Status: DC | PRN
  Administered 2021-04-24: 2 mg via INTRAVENOUS

## 2021-04-24 MED ORDER — KETOROLAC TROMETHAMINE 30 MG/ML IJ SOLN: 30.0000 mg | Freq: Once | INTRAMUSCULAR | Status: DC | PRN

## 2021-04-24 MED ORDER — DEXAMETHASONE SODIUM PHOSPHATE 10 MG/ML IJ SOLN
INTRAMUSCULAR | Status: DC | PRN
  Administered 2021-04-24: 10 mg via INTRAVENOUS

## 2021-04-24 MED ORDER — FENTANYL CITRATE (PF) 100 MCG/2ML IJ SOLN: 25.0000 ug | INTRAMUSCULAR | Status: DC | PRN

## 2021-04-24 MED ORDER — ONDANSETRON HCL 4 MG/2ML IJ SOLN
INTRAMUSCULAR | Status: DC | PRN
  Administered 2021-04-24: 4 mg via INTRAVENOUS

## 2021-04-24 MED ORDER — SCOPOLAMINE 1 MG/3DAYS TD PT72
MEDICATED_PATCH | TRANSDERMAL | Status: AC
  Filled 2021-04-24: qty 1

## 2021-04-24 MED ORDER — PROPOFOL 10 MG/ML IV BOLUS
INTRAVENOUS | Status: AC
  Filled 2021-04-24: qty 20

## 2021-04-24 MED ORDER — FENTANYL CITRATE (PF) 100 MCG/2ML IJ SOLN
INTRAMUSCULAR | Status: DC | PRN
  Administered 2021-04-24: 50 ug via INTRAVENOUS
  Administered 2021-04-24: 100 ug via INTRAVENOUS
  Administered 2021-04-24: 50 ug via INTRAVENOUS
  Administered 2021-04-24: 25 ug via INTRAVENOUS

## 2021-04-24 MED ORDER — SODIUM CHLORIDE (PF) 0.9 % IJ SOLN
INTRAMUSCULAR | Status: AC
  Filled 2021-04-24: qty 10

## 2021-04-24 MED ORDER — KETOROLAC TROMETHAMINE 30 MG/ML IJ SOLN
INTRAMUSCULAR | Status: DC | PRN
  Administered 2021-04-24: 30 mg via INTRAVENOUS

## 2021-04-24 MED ORDER — ACETAMINOPHEN 500 MG PO TABS
ORAL_TABLET | ORAL | Status: AC
  Filled 2021-04-24: qty 2

## 2021-04-24 MED ORDER — OXYCODONE HCL 5 MG PO TABS
ORAL_TABLET | ORAL | Status: AC
  Filled 2021-04-24: qty 1

## 2021-04-24 MED ORDER — LIDOCAINE 2% (20 MG/ML) 5 ML SYRINGE
INTRAMUSCULAR | Status: DC | PRN
  Administered 2021-04-24: 60 mg via INTRAVENOUS

## 2021-04-24 MED ORDER — ROCURONIUM BROMIDE 10 MG/ML (PF) SYRINGE
PREFILLED_SYRINGE | INTRAVENOUS | Status: AC
  Filled 2021-04-24: qty 10

## 2021-04-24 MED ORDER — SODIUM CHLORIDE (PF) 0.9 % IJ SOLN
INTRAMUSCULAR | Status: AC
  Filled 2021-04-24: qty 20

## 2021-04-24 MED ORDER — EPHEDRINE 5 MG/ML INJ
INTRAVENOUS | Status: AC
  Filled 2021-04-24: qty 5

## 2021-04-24 MED ORDER — ROCURONIUM BROMIDE 10 MG/ML (PF) SYRINGE
PREFILLED_SYRINGE | INTRAVENOUS | Status: DC | PRN
  Administered 2021-04-24: 50 mg via INTRAVENOUS
  Administered 2021-04-24: 10 mg via INTRAVENOUS

## 2021-04-24 MED ORDER — CLINDAMYCIN PHOSPHATE 900 MG/50ML IV SOLN
INTRAVENOUS | Status: AC
  Filled 2021-04-24: qty 50

## 2021-04-24 MED ORDER — AMISULPRIDE (ANTIEMETIC) 5 MG/2ML IV SOLN: 10.0000 mg | Freq: Once | INTRAVENOUS | Status: DC | PRN

## 2021-04-24 SURGICAL SUPPLY — 33 items
ADH SKN CLS APL DERMABOND .7 (GAUZE/BANDAGES/DRESSINGS) ×1
APL SRG 38 LTWT LNG FL B (MISCELLANEOUS)
APPLICATOR ARISTA FLEXITIP XL (MISCELLANEOUS) IMPLANT
BAG SPEC RTRVL LRG 6X4 10 (ENDOMECHANICALS)
DERMABOND ADVANCED (GAUZE/BANDAGES/DRESSINGS) ×1
DERMABOND ADVANCED .7 DNX12 (GAUZE/BANDAGES/DRESSINGS) ×1 IMPLANT
DRSG OPSITE POSTOP 3X4 (GAUZE/BANDAGES/DRESSINGS) ×2 IMPLANT
DURAPREP 26ML APPLICATOR (WOUND CARE) ×2 IMPLANT
GAUZE 4X4 16PLY ~~LOC~~+RFID DBL (SPONGE) ×2 IMPLANT
GLOVE SRG 8 PF TXTR STRL LF DI (GLOVE) ×2 IMPLANT
GLOVE SURG ENC MOIS LTX SZ8 (GLOVE) ×2 IMPLANT
GLOVE SURG UNDER POLY LF SZ7 (GLOVE) ×4 IMPLANT
GLOVE SURG UNDER POLY LF SZ8 (GLOVE) ×4
GOWN STRL REUS W/TWL LRG LVL3 (GOWN DISPOSABLE) ×2 IMPLANT
GOWN STRL REUS W/TWL XL LVL3 (GOWN DISPOSABLE) ×2 IMPLANT
HEMOSTAT ARISTA ABSORB 3G PWDR (HEMOSTASIS) IMPLANT
KIT TURNOVER CYSTO (KITS) ×2 IMPLANT
LIGASURE VESSEL 5MM BLUNT TIP (ELECTROSURGICAL) ×2 IMPLANT
NS IRRIG 1000ML POUR BTL (IV SOLUTION) ×2 IMPLANT
PACK LAPAROSCOPY BASIN (CUSTOM PROCEDURE TRAY) ×2 IMPLANT
PACK TRENDGUARD 450 HYBRID PRO (MISCELLANEOUS) ×1 IMPLANT
POUCH SPECIMEN RETRIEVAL 10MM (ENDOMECHANICALS) IMPLANT
SET IRRIG TUBING LAPAROSCOPIC (IRRIGATION / IRRIGATOR) IMPLANT
SET TUBE SMOKE EVAC HIGH FLOW (TUBING) ×2 IMPLANT
SLEEVE XCEL OPT CAN 5 100 (ENDOMECHANICALS) ×4 IMPLANT
SUT MNCRL AB 4-0 PS2 18 (SUTURE) ×2 IMPLANT
SUT VICRYL 0 UR6 27IN ABS (SUTURE) ×2 IMPLANT
TOWEL OR 17X26 10 PK STRL BLUE (TOWEL DISPOSABLE) ×4 IMPLANT
TRAY FOLEY W/BAG SLVR 14FR (SET/KITS/TRAYS/PACK) ×2 IMPLANT
TRENDGUARD 450 HYBRID PRO PACK (MISCELLANEOUS) ×2
TROCAR BALLN 12MMX100 BLUNT (TROCAR) ×2 IMPLANT
TROCAR XCEL NON-BLD 5MMX100MML (ENDOMECHANICALS) ×2 IMPLANT
WARMER LAPAROSCOPE (MISCELLANEOUS) ×2 IMPLANT

## 2021-04-24 NOTE — Anesthesia Procedure Notes (Signed)
Procedure Name: Intubation Date/Time: 04/24/2021 8:28 AM Performed by: Rogers Blocker, CRNA Pre-anesthesia Checklist: Patient identified, Emergency Drugs available, Suction available and Patient being monitored Patient Re-evaluated:Patient Re-evaluated prior to induction Oxygen Delivery Method: Circle System Utilized Preoxygenation: Pre-oxygenation with 100% oxygen Induction Type: IV induction Ventilation: Mask ventilation without difficulty Laryngoscope Size: Mac and 3 Grade View: Grade I Tube type: Oral Tube size: 7.0 mm Number of attempts: 1 Airway Equipment and Method: Stylet and Bite block Placement Confirmation: ETT inserted through vocal cords under direct vision, positive ETCO2 and breath sounds checked- equal and bilateral Secured at: 22 cm Tube secured with: Tape Dental Injury: Teeth and Oropharynx as per pre-operative assessment

## 2021-04-24 NOTE — Interval H&P Note (Signed)
History and Physical Interval Note:  04/24/2021 8:13 AM  Katherine Maldonado  has presented today for surgery, with the diagnosis of Undesired fertility.  The various methods of treatment have been discussed with the patient and family. After consideration of risks, benefits and other options for treatment, the patient has consented to  Procedure(s): LAPAROSCOPIC BILATERAL SALPINGECTOMY (Bilateral) as a surgical intervention.  The patient's history has been reviewed, patient examined, no change in status, stable for surgery.  I have reviewed the patient's chart and labs.  Questions were answered to the patient's satisfaction.     Warden Fillers

## 2021-04-24 NOTE — Anesthesia Postprocedure Evaluation (Signed)
Anesthesia Post Note  Patient: Katherine Maldonado  Procedure(s) Performed: LAPAROSCOPIC BILATERAL SALPINGECTOMY (Bilateral: Abdomen)     Patient location during evaluation: PACU Anesthesia Type: General Level of consciousness: awake Pain management: pain level controlled Vital Signs Assessment: post-procedure vital signs reviewed and stable Respiratory status: spontaneous breathing, nonlabored ventilation, respiratory function stable and patient connected to nasal cannula oxygen Cardiovascular status: blood pressure returned to baseline and stable Postop Assessment: no apparent nausea or vomiting Anesthetic complications: no   No notable events documented.  Last Vitals:  Vitals:   04/24/21 1022 04/24/21 1057  BP:  110/82  Pulse: (!) 54 61  Resp: 15 19  Temp:    SpO2: 96% 98%    Last Pain:  Vitals:   04/24/21 1057  TempSrc:   PainSc: 4                  Mirka Barbone P Finnlee Guarnieri

## 2021-04-24 NOTE — Discharge Instructions (Addendum)

## 2021-04-24 NOTE — Transfer of Care (Signed)
Immediate Anesthesia Transfer of Care Note  Patient: Katherine Maldonado  Procedure(s) Performed: LAPAROSCOPIC BILATERAL SALPINGECTOMY (Bilateral: Abdomen)  Patient Location: PACU  Anesthesia Type:General  Level of Consciousness: awake and patient cooperative  Airway & Oxygen Therapy: Patient Spontanous Breathing and Patient connected to nasal cannula oxygen  Post-op Assessment: Report given to RN and Post -op Vital signs reviewed and stable  Post vital signs: Reviewed and stable  Last Vitals:  Vitals Value Taken Time  BP    Temp    Pulse 81 04/24/21 0930  Resp    SpO2 98 % 04/24/21 0930  Vitals shown include unvalidated device data.  Last Pain:  Vitals:   04/24/21 0702  TempSrc: Oral  PainSc: 0-No pain         Complications: No notable events documented.

## 2021-04-24 NOTE — Op Note (Signed)
[name] PROCEDURE DATE: 04/24/2021   PREOPERATIVE DIAGNOSIS:  Undesired fertility  POSTOPERATIVE DIAGNOSIS:  Undesired fertility  PROCEDURE:  Laparoscopic Bilateral Salpingectomy   SURGEON:  Dr. Mariel Aloe  ASSISTANT: none  ANESTHESIA:  General endotracheal  COMPLICATIONS:  None immediate.  ESTIMATED BLOOD LOSS:  5 ml.  FLUIDS: 800 ml LR.  URINE OUTPUT:  50 ml of clear urine.  INDICATIONS: 35 y.o. G1P1001 with undesired fertility, desires permanent sterilization. Other reversible forms of contraception were discussed with patient; she declines all other modalities.  Risks of procedure discussed with patient including permanence of method, risk of regret, bleeding, infection, injury to surrounding organs and need for additional procedures including laparotomy.  Failure risk less than 0.5% with increased risk of ectopic gestation if pregnancy occurs was also discussed with patient.  Written informed consent was obtained.    FINDINGS:  Normal uterus, fallopian tubes, and ovaries.  Bilateral tubes were short in length  TECHNIQUE:  The patient was taken to the operating room where general anesthesia was obtained without difficulty.  She was then placed in the dorsal lithotomy position and prepared and draped in sterile fashion.  After an adequate timeout was performed, a bivalved speculum was then placed in the patient's vagina, and the anterior lip of cervix grasped with the single-tooth tenaculum.  The Hulka uterine manipulator was then advanced into the uterus and secured.  The speculum was removed from the vagina as was the tenaculum.  Attention was then turned to the patient's abdomen where a 6-mm skin incision was made in the umbilical fold.  The Optiview 5-mm trocar and sleeve were then advanced without difficulty with the laparoscope under direct visualization into the abdomen.  The abdomen was then insufflated with carbon dioxide gas.  Adequate pneumoperitoneum was  obtained.  A survey of the patient's pelvis and abdomen revealed the findings above. Bilateral 5-mm lower quadrant ports were then placed under direct visualization.  The fallopian tubes were transected from the uterine attachments and the underlying mesosalpinx with the LigaSure device allowing for bilateral salpingectomy.  The fallopian tubes were then removed from the abdomen under direct visualization.  The operative site was surveyed, and it was found to be hemostatic.   No intraoperative injury to other surrounding organs was noted.  The abdomen was desufflated and all instruments were then removed from the patient's abdomen.  All skin incisions were closed with 4-0 Vicryl and Dermabond.  The uterine manipulator was removed from the cervix without complications. The patient tolerated the procedure well.  Sponge, lap, and needle counts were correct times two.  The patient was then taken to the recovery room awake, extubated and in stable condition.  The patient will be discharged to home as per PACU criteria.  Routine postoperative instructions given.  She was prescribed Percocet, Ibuprofen.  She will follow up in the clinic in 2 weeks for postoperative evaluation.   Mariel Aloe, MD, FACOG Attending Obstetrician & Gynecologist Faculty Practice, [facility]

## 2021-04-25 ENCOUNTER — Other Ambulatory Visit: Payer: Self-pay

## 2021-04-25 ENCOUNTER — Ambulatory Visit: Payer: Medicaid Other | Admitting: Neurology

## 2021-04-25 ENCOUNTER — Encounter (HOSPITAL_BASED_OUTPATIENT_CLINIC_OR_DEPARTMENT_OTHER): Payer: Self-pay | Admitting: Obstetrics and Gynecology

## 2021-04-25 ENCOUNTER — Other Ambulatory Visit (INDEPENDENT_AMBULATORY_CARE_PROVIDER_SITE_OTHER): Payer: Medicaid Other | Admitting: Obstetrics and Gynecology

## 2021-04-25 DIAGNOSIS — G8918 Other acute postprocedural pain: Secondary | ICD-10-CM

## 2021-04-25 LAB — SURGICAL PATHOLOGY

## 2021-04-25 MED ORDER — OXYCODONE-ACETAMINOPHEN 5-325 MG PO TABS
1.0000 | ORAL_TABLET | ORAL | 0 refills | Status: AC | PRN
Start: 2021-04-25 — End: 2021-04-30

## 2021-04-25 NOTE — Progress Notes (Signed)
Error encounter. 

## 2021-04-25 NOTE — Progress Notes (Signed)
Resend of percocet, pharmacy would not fill 7 day supply for post op pain.

## 2021-04-25 NOTE — Progress Notes (Signed)
Erroneous Encounter

## 2021-05-06 ENCOUNTER — Ambulatory Visit (INDEPENDENT_AMBULATORY_CARE_PROVIDER_SITE_OTHER): Payer: Medicaid Other | Admitting: Obstetrics and Gynecology

## 2021-05-06 ENCOUNTER — Other Ambulatory Visit: Payer: Self-pay

## 2021-05-06 DIAGNOSIS — Z4889 Encounter for other specified surgical aftercare: Secondary | ICD-10-CM

## 2021-05-06 DIAGNOSIS — R109 Unspecified abdominal pain: Secondary | ICD-10-CM | POA: Insufficient documentation

## 2021-05-06 DIAGNOSIS — R10A0 Flank pain, unspecified side: Secondary | ICD-10-CM

## 2021-05-06 HISTORY — DX: Encounter for other specified surgical aftercare: Z48.89

## 2021-05-06 HISTORY — DX: Unspecified abdominal pain: R10.9

## 2021-05-06 LAB — POCT URINALYSIS DIP (DEVICE)
Bilirubin Urine: NEGATIVE
Glucose, UA: NEGATIVE mg/dL
Hgb urine dipstick: NEGATIVE
Ketones, ur: NEGATIVE mg/dL
Leukocytes,Ua: NEGATIVE
Nitrite: NEGATIVE
Protein, ur: NEGATIVE mg/dL
Specific Gravity, Urine: 1.02 (ref 1.005–1.030)
Urobilinogen, UA: 0.2 mg/dL (ref 0.0–1.0)
pH: 6 (ref 5.0–8.0)

## 2021-05-06 MED ORDER — CYCLOBENZAPRINE HCL 10 MG PO TABS: 10.0000 mg | ORAL_TABLET | Freq: Three times a day (TID) | ORAL | 0 refills | Status: DC | PRN

## 2021-05-06 NOTE — Progress Notes (Signed)
    Subjective:    Katherine Maldonado is a 35 y.o. female who presents to the clinic status post laparoscopic bilateral salpingectomy on 04/24/21.  Pain is overall well controlled but she does note some left flank pain which she has had since the procedure.  She denies dysuria.   Eating a regular diet without difficulty. Bowel movements are normal. No other significant postoperative concerns.  The following portions of the patient's history were reviewed and updated as appropriate: allergies, current medications, past family history, past medical history, past social history, past surgical history, and problem list..  Last pap smear was normal on 03/04/21.  Review of Systems Pertinent items are noted in HPI.   Objective:   BP 114/78   Pulse 82   Ht 5\' 7"  (1.702 m)   Wt 225 lb (102.1 kg)   LMP  (LMP Unknown) Comment: Has only had spotting in postpartum  Breastfeeding Yes   BMI 35.24 kg/m  Constitutional:  Well-developed, well-nourished female in no acute distress.   Skin: Skin is warm and dry, no rash noted, not diaphoretic,no erythema, no pallor.  Cardiovascular: Normal heart rate noted  Respiratory: Effort and breath sounds normal, no problems with respiration noted  Abdomen: Soft, bowel sounds active, non-tender, no abnormal masses  Incision: Three incisions healing well, no drainage, no erythema, no hernia, no seroma, no swelling, no dehiscence, incision well approximated  Pelvic:   Deferred   Surgical pathology () Normal fimbriated tubes Assessment:   Doing well postoperatively.  Operative findings again reviewed. Pathology report discussed.   Plan:   1. Continue any current medications. 2. Wound care discussed. 3. Activity restrictions: none 4. Anticipated return to work: now. 5. Follow up as needed, will check UA and urine culture to eval flank pain, rx for flexeril to address mild discomfort 6.  Routine preventative health maintenance measures emphasized. Please refer  to After Visit Summary for other counseling recommendations.    , MD, FACOG Attending Obstetrician & Gynecologist Center for [facility], [facility]

## 2021-05-09 ENCOUNTER — Telehealth: Payer: Self-pay | Admitting: Clinical

## 2021-05-09 LAB — URINE CULTURE

## 2021-05-09 NOTE — Telephone Encounter (Signed)
[facility] intern left HIPPA-compliant message to call back Jamie from [facility] at  [phone] (Jamie's office).   

## 2021-06-05 ENCOUNTER — Encounter (HOSPITAL_BASED_OUTPATIENT_CLINIC_OR_DEPARTMENT_OTHER): Payer: Self-pay | Admitting: Nurse Practitioner

## 2021-06-05 ENCOUNTER — Other Ambulatory Visit (HOSPITAL_BASED_OUTPATIENT_CLINIC_OR_DEPARTMENT_OTHER): Payer: Self-pay | Admitting: Nurse Practitioner

## 2021-06-05 ENCOUNTER — Ambulatory Visit (INDEPENDENT_AMBULATORY_CARE_PROVIDER_SITE_OTHER): Payer: Medicaid Other | Admitting: Nurse Practitioner

## 2021-06-05 ENCOUNTER — Other Ambulatory Visit: Payer: Self-pay

## 2021-06-05 VITALS — BP 122/82 | HR 84 | Ht 67.0 in | Wt 232.0 lb

## 2021-06-05 DIAGNOSIS — J40 Bronchitis, not specified as acute or chronic: Secondary | ICD-10-CM

## 2021-06-05 DIAGNOSIS — E669 Obesity, unspecified: Secondary | ICD-10-CM | POA: Diagnosis not present

## 2021-06-05 DIAGNOSIS — G43709 Chronic migraine without aura, not intractable, without status migrainosus: Secondary | ICD-10-CM | POA: Diagnosis not present

## 2021-06-05 DIAGNOSIS — E1165 Type 2 diabetes mellitus with hyperglycemia: Secondary | ICD-10-CM | POA: Diagnosis not present

## 2021-06-05 MED ORDER — RIZATRIPTAN BENZOATE 10 MG PO TABS: 10.0000 mg | ORAL_TABLET | ORAL | 0 refills | Status: DC | PRN

## 2021-06-05 MED ORDER — PEN NEEDLES 32G X 4 MM MISC: 11 refills | Status: DC

## 2021-06-05 MED ORDER — SEMAGLUTIDE(0.25 OR 0.5MG/DOS) 2 MG/1.5ML ~~LOC~~ SOPN: 0.5000 mg | PEN_INJECTOR | SUBCUTANEOUS | 3 refills | Status: DC

## 2021-06-05 MED ORDER — PROAIR HFA 108 (90 BASE) MCG/ACT IN AERS: INHALATION_SPRAY | RESPIRATORY_TRACT | 5 refills | Status: DC

## 2021-06-05 MED ORDER — OZEMPIC (0.25 OR 0.5 MG/DOSE) 2 MG/1.5ML ~~LOC~~ SOPN: PEN_INJECTOR | SUBCUTANEOUS | 0 refills | Status: AC

## 2021-06-05 MED ORDER — ALBUTEROL SULFATE HFA 108 (90 BASE) MCG/ACT IN AERS: 2.0000 | INHALATION_SPRAY | Freq: Four times a day (QID) | RESPIRATORY_TRACT | 11 refills | Status: DC | PRN

## 2021-06-05 MED ORDER — OZEMPIC (0.25 OR 0.5 MG/DOSE) 2 MG/1.5ML ~~LOC~~ SOPN: PEN_INJECTOR | SUBCUTANEOUS | 0 refills | Status: DC

## 2021-06-05 NOTE — Assessment & Plan Note (Signed)
Hx of chronic migraines with difficulty relieving with OTC medications.  Will trial maxalt as this should not cause significant drowsiness in patient.

## 2021-06-05 NOTE — Progress Notes (Deleted)
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  UPDATE: Presented in April at [redacted] weeks gestation.  Started on magnesium oxide and riboflavin.  ***.  She gave birth to a baby *** on 02/13/2021.  ***  Current treatment:  Fioricet, Tylenol, magnesium oxide 400mg  daily, riboflavin 400mg  daily  Referred to physical therapy for back pain.  ***  HISTORY: I  Migraines: Migraines since childhood.  They are severe right frontal pounding headache.  They are associated with photophobia, phonophobia, sometimes nausea.  They typically last 6 hours to all day and occur up to 3-4 days a month.  No known trigger.  No relieving factors.  Since she has become pregnant, they have been worse.  There is more nausea.  She wakes up with them.  They are lasting several hours waking up with them at 8 AM and lasting until 2 PM.  Last month, she had 6 migraines (some related to emotional stress with the passing of her father).    Past treatment:  Ibuprofen, Excedrin   II  She injured her tail bone 6 or 7 years ago.  She sneezed and developed severe pain.  She had total numbness of the leg.  It subsequently resolved.  She strained her back in September 2021 and had a recurrence of numbness and burning from the the left hip to the anterior thigh up to the knee.  Associated back pain. .  No associated weakness.  Taking Flexeril for back pain.    PAST MEDICAL HISTORY: Past Medical History:  Diagnosis Date   Anxiety    Back pain    Depression    History of gestational diabetes    none since 02-14-2021 childbirth   History of pre-eclampsia    none  since 02-14-2021 childbirth   Migraines    Numbness 04/17/2021   and burning of left thigh at times   Wears contact lenses     MEDICATIONS: Current Outpatient Medications on File Prior to Visit  Medication Sig Dispense Refill   cyclobenzaprine (FLEXERIL) 10 MG tablet Take 1 tablet (10 mg total) by mouth 3 (three) times daily as needed for muscle spasms. 30 tablet 0   ibuprofen (ADVIL) 600 MG tablet Take 1 tablet (600 mg total) by mouth every 6 (six) hours as needed for mild pain or moderate pain. 30 tablet 2   Prenatal 27-1 MG TABS Take 1 tablet by mouth daily. 30 tablet 11   No current facility-administered medications on file prior to visit.    ALLERGIES: Allergies  Allergen Reactions   Latex Swelling and Rash   Penicillins Rash and Other (See Comments)    Did it involve swelling of the face/tongue/throat, SOB, or low BP? Unknown Did it involve sudden or severe rash/hives, skin peeling, or any reaction on the inside of your mouth or nose? Unknown Did you need to seek medical attention at a hospital or doctor's office? Unknown When did it last happen?  childhood     If all above answers are "NO", may proceed with cephalosporin use.   Childhood reaction     FAMILY HISTORY: Family History  Adopted: Yes  Problem Relation Age of Onset  Thyroid disease Mother    COPD Mother    Diabetes Father    Hypertension Father    Thrombocytopenia Father       Objective:  *** General: No acute distress.  Patient appears ***-groomed.   Head:  Normocephalic/atraumatic Eyes:  Fundi examined but not visualized Neck: supple, no paraspinal tenderness, full range of motion Heart:  Regular rate and rhythm Lungs:  Clear to auscultation bilaterally Back: No paraspinal tenderness Neurological Exam: alert and oriented to person, place, and time.  Speech fluent and not dysarthric, language intact.  CN II-XII intact. Bulk and tone normal, muscle strength 5/5 throughout.  Sensation to light  touch intact.  Deep tendon reflexes 2+ throughout, toes downgoing.  Finger to nose testing intact.  Gait normal, Romberg negative.   Shon Millet, DO  CC: ***

## 2021-06-05 NOTE — Assessment & Plan Note (Signed)
Will start GLP-1 medication today for positive OGTT and for help with weight management Education provided today on medication, administration, storage, expectations, BG monitoring, diet, and activity.  She will f/u in 3 months and we will check A1c

## 2021-06-05 NOTE — Patient Instructions (Addendum)
Thank you for choosing [place] [facility] for your Primary Care needs. I am excited for the opportunity to partner with you to meet your health care goals. It was a pleasure meeting you today!  Recommendations from today's visit: I have sent in a prescription for the medication Ozempic for diabetes. This medication helps to increase the insulin use in your cells and prevents the highs and lows from the liver spilling extra sugar in response to insulin resistance.  Check your blood sugar if you feel like your sugar is dropping.  Be sure to eat snacks with protein and carb to help keep your sugar stable.  If you have any side effects or concerns, let me know. We will plan to recheck your blood in 3 months and see how you are doing.   Information on diet, exercise, and health maintenance recommendations are listed below. This is information to help you be sure you are on track for optimal health and monitoring.   Please look over this and let us know if you have any questions or if you have completed any of the health maintenance outside of [place] so that we can be sure your records are up to date.  ___________________________________________________________ About Me: I am an Adult-Geriatric Nurse Practitioner with a background in caring for patients for more than 20 years with a strong intensive care background. I provide primary care and sports medicine services to patients age 42 and older within this office. My education had a strong focus on caring for the older adult population, which I am passionate about. I am also the director of the APP Fellowship with [facility].   My desire is to provide you with the best service through preventive medicine and supportive care. I consider you a part of the medical team and value your input. I work diligently to ensure that you are heard and your needs are met in a safe and effective manner. I want you to feel comfortable with  me as your provider and want you to know that your health concerns are important to me.  For your information, our office hours are: Monday, Tuesday, and Thursday 8:00 AM - 5:00 PM Wednesday and Friday 8:00 AM - 12:00 PM.   In my time away from the office I am teaching new APP's within the system and am unavailable, but my partner, Dr. Burnard Bunting is in the office for emergent needs.   If you have questions or concerns, please call our office at [phone] or send [place] a MyChart message and we will respond as quickly as possible.  ____________________________________________________________ MyChart:  For all urgent or time sensitive needs we ask that you please call the office to avoid delays. Our number is [phone]. MyChart is not constantly monitored and due to the large volume of messages a day, replies may take up to 72 business hours.  MyChart Policy: MyChart allows for you to see your visit notes, after visit summary, provider recommendations, lab and tests results, make an appointment, request refills, and contact your provider or the office for non-urgent questions or concerns. Providers are seeing patients during normal business hours and do not have built in time to review MyChart messages.  We ask that you allow a minimum of 3 business days for responses to [employer]. For this reason, please do not send urgent requests through [place]. Please call the office at [phone]. New and ongoing conditions may require a visit. We have virtual and  in person visit available for your convenience.  Complex MyChart concerns may require a visit. Your provider may request you schedule a virtual or in person visit to ensure we are providing the best care possible. MyChart messages sent after 11:00 AM on Friday will not be received by the provider until Monday morning.    Lab and Test Results: You will receive your lab and test results on MyChart as soon as they are completed and results  have been sent by the lab or testing facility. Due to this service, you will receive your results BEFORE your provider.  I review lab and tests results each morning prior to seeing patients. Some results require collaboration with other providers to ensure you are receiving the most appropriate care. For this reason, we ask that you please allow a minimum of 3-5 business days from the time the ALL results have been received for your provider to receive and review lab and test results and contact you about these.  Most lab and test result comments from the provider will be sent through [place]. Your provider may recommend changes to the plan of care, follow-up visits, repeat testing, ask questions, or request an office visit to discuss these results. You may reply directly to this message or call the office at [phone] to provide information for the provider or set up an appointment. In some instances, you will be called with test results and recommendations. Please let us know if this is preferred and we will make note of this in your chart to provide this for you.    If you have not heard a response to your lab or test results in 5 business days from all results returning to [place], please call the office to let us know. We ask that you please avoid calling prior to this time unless there is an emergent concern. Due to high call volumes, this can delay the resulting process.  After Hours: For all non-emergency after hours needs, please call the office at [phone] and select the option to reach the on-call provider service. On-call services are shared between multiple [facility] offices and therefore it will not be possible to speak directly with your provider. On-call providers may provide medical advice and recommendations, but are unable to provide refills for maintenance medications.  For all emergency or urgent medical needs after normal business hours, we recommend that you seek care at the  closest Urgent Care or Emergency Department to ensure appropriate treatment in a timely manner.  [facility] has a 24 hour emergency room located on the ground floor for your convenience.   Urgent Concerns During the Business Day Providers are seeing patients from 8AM to [place] with a busy schedule and are most often not able to respond to non-urgent calls until the end of the day or the next business day. If you should have URGENT concerns during the day, please call and speak to the nurse or schedule a same day appointment so that we can address your concern without delay.   Thank you, again, for choosing me as your health care partner. I appreciate your trust and look forward to learning more about you.   Worthy Keeler, DNP, AGNP-c ___________________________________________________________  Health Maintenance Recommendations Screening Testing Mammogram Every 1 -2 years based on history and risk factors Starting at age 62 Pap Smear Ages 21-39 every 3 years Ages 44-65 every 5 years with HPV testing More frequent testing may be required based on results and  history Colon Cancer Screening Every 1-10 years based on test performed, risk factors, and history Starting at age 75 Bone Density Screening Every 2-10 years based on history Starting at age 34 for women Recommendations for men differ based on medication usage, history, and risk factors AAA Screening One time ultrasound Men 46-79 years old who have every smoked Lung Cancer Screening Low Dose Lung CT every 12 months Age 33-80 years with a 30 pack-year smoking history who still smoke or who have quit within the last 15 years  Screening Labs Routine  Labs: Complete Blood Count (CBC), Complete Metabolic Panel (CMP), Cholesterol (Lipid Panel) Every 6-12 months based on history and medications May be recommended more frequently based on current conditions or previous results Hemoglobin A1c Lab Every 3-12  months based on history and previous results Starting at age 39 or earlier with diagnosis of diabetes, high cholesterol, BMI >26, and/or risk factors Frequent monitoring for patients with diabetes to ensure blood sugar control Thyroid Panel (TSH w/ T3 & T4) Every 6 months based on history, symptoms, and risk factors May be repeated more often if on medication HIV One time testing for all patients 78 and older May be repeated more frequently for patients with increased risk factors or exposure Hepatitis C One time testing for all patients 90 and older May be repeated more frequently for patients with increased risk factors or exposure Gonorrhea, Chlamydia Every 12 months for all sexually active persons 13-24 years Additional monitoring may be recommended for those who are considered high risk or who have symptoms PSA Men 107-73 years old with risk factors Additional screening may be recommended from age 60-69 based on risk factors, symptoms, and history  Vaccine Recommendations Tetanus Booster All adults every 10 years Flu Vaccine All patients 6 months and older every year COVID Vaccine All patients 12 years and older Initial dosing with booster May recommend additional booster based on age and health history HPV Vaccine 2 doses all patients age 57-26 Dosing may be considered for patients over 26 Shingles Vaccine (Shingrix) 2 doses all adults 34 years and older Pneumonia (Pneumovax 23) All adults 64 years and older May recommend earlier dosing based on health history Pneumonia (Prevnar 37) All adults 54 years and older Dosed 1 year after Pneumovax 23  Additional Screening, Testing, and Vaccinations may be recommended on an individualized basis based on family history, health history, risk factors, and/or exposure.  __________________________________________________________  Diet Recommendations for All Patients  I recommend that all patients maintain a diet low in saturated  fats, carbohydrates, and cholesterol. While this can be challenging at first, it is not impossible and small changes can make big differences.  Things to try: Decreasing the amount of soda, sweet tea, and/or juice to one or less per day and replace with water While water is always the first choice, if you do not like water you may consider adding a water additive without sugar to improve the taste other sugar free drinks Replace potatoes with a brightly colored vegetable at dinner Use healthy oils, such as canola oil or olive oil, instead of butter or hard margarine Limit your bread intake to two pieces or less a day Replace regular pasta with low carb pasta options Bake, broil, or grill foods instead of frying Monitor portion sizes  Eat smaller, more frequent meals throughout the day instead of large meals  An important thing to remember is, if you love foods that are not great for your health, you don't have  to give them up completely. Instead, allow these foods to be a reward when you have done well. Allowing yourself to still have special treats every once in a while is a nice way to tell yourself thank you for working hard to keep yourself healthy.   Also remember that every day is a new day. If you have a bad day and "fall off the wagon", you can still climb right back up and keep moving along on your journey!  We have resources available to help you!  Some websites that may be helpful include: www.http://carter.biz/  Www.VeryWellFit.com _____________________________________________________________  Activity Recommendations for All Patients  I recommend that all adults get at least 20 minutes of moderate physical activity that elevates your heart rate at least 5 days out of the week.  Some examples include: Walking or jogging at a pace that allows you to carry on a conversation Cycling (stationary bike or outdoors) Water aerobics Yoga Weight lifting Dancing If physical limitations  prevent you from putting stress on your joints, exercise in a pool or seated in a chair are excellent options.  Do determine your MAXIMUM heart rate for activity: YOUR AGE - 220 = MAX HeartRate   Remember! Do not push yourself too hard.  Start slowly and build up your pace, speed, weight, time in exercise, etc.  Allow your body to rest between exercise and get good sleep. You will need more water than normal when you are exerting yourself. Do not wait until you are thirsty to drink. Drink with a purpose of getting in at least 8, 8 ounce glasses of water a day plus more depending on how much you exercise and sweat.    If you begin to develop dizziness, chest pain, abdominal pain, jaw pain, shortness of breath, headache, vision changes, lightheadedness, or other concerning symptoms, stop the activity and allow your body to rest. If your symptoms are severe, seek emergency evaluation immediately. If your symptoms are concerning, but not severe, please let us know so that we can recommend further evaluation.

## 2021-06-05 NOTE — Progress Notes (Signed)
Tollie Eth, DNP, AGNP-c Primary Care & Sports Medicine [address]  [address] [place], [place] [address] [phone]  New patient visit   Patient: Katherine Maldonado   DOB: Nov 16, 1985   35 y.o. Female  MRN: [id number] Visit Date: 06/05/2021  Patient Care Team: Jahbari Repinski, Sung Amabile, NP as PCP - General (Nurse Practitioner)  Today's healthcare provider: Tollie Eth, NP   Chief Complaint  Patient presents with   Establish Care    Patient is here to establish care. She is concerned regarding sugar readings from OB/GYN. She has her tubes removed 04/24/21. Patient stated she doesn't eat good, she only eats one meal a day or nothing at all.    Subjective    Katherine Maldonado is a 35 y.o. female who presents today as a new patient to establish care.  HPI HPI     Establish Care    Additional comments: Patient is here to establish care. She is concerned regarding sugar readings from OB/GYN. She has her tubes removed 04/24/21. Patient stated she doesn't eat good, she only eats one meal a day or nothing at all.       Last edited by Carnella Guadalajara on 06/05/2021 11:26 AM.      [name] endorses a OGTT positive for diabetes at recent [facility] visit. She does have a history of gestational diabetes during her pregnancy. Her baby, Katherine Maldonado, is with her today and is now 39 months old. She is not currently breastfeeding. She also endorses history of pre-eclampsia during pregnancy. She had a tubal ligation after the birth of her daughter.  While pregnant she was on insulin daily and monitoring her blood sugar closely. She reports that she eats maybe one meal a day and at times she only eats pretzels or something similar. She enjoys Dr. Reino Kent, Coke, and Sweet Tea and does not feel she can give this up.   She also tells me that she has had issues with migraine headaches. She has taken medication in the past but it made her feel "foggy". She can occasionally manage these with  ibuprofen or tylenol, but there are times when she becomes quite ill from the symptoms and needs additional support.   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--partner declined testing   Depression    Encounter for induction of labor 02/13/2021   Encounter for postoperative care 05/06/2021   Encounter for postpartum visit 03/04/2021   Flank pain 05/06/2021   GBS (group B Streptococcus carrier), +RV culture, currently pregnant 02/06/2021   Headache in pregnancy, antepartum, second trimester 09/12/2020   History of gestational diabetes    none since 02-14-2021 childbirth   History of pre-eclampsia    none since 02-14-2021 childbirth   Migraines    Numbness 04/17/2021   and burning of left thigh at times   Obesity in pregnancy 12/21/2020   Preeclampsia, severe, third trimester 01/30/2021   Preoperative exam for gynecologic surgery 04/22/2021   Unwanted fertility 12/06/2020   btl papers 12/06/20    Wears contact lenses    Past Surgical History:  Procedure Laterality Date   LAPAROSCOPIC BILATERAL SALPINGECTOMY Bilateral 04/24/2021   Procedure: LAPAROSCOPIC BILATERAL SALPINGECTOMY;  Surgeon: Warden Fillers, MD;  [facility];  Service:  Gynecology;  Laterality: Bilateral;   TOOTH EXTRACTION     last done 2020   Family Status  Relation Name Status   Mother  Alive   Father  Alive   Family History  Adopted: Yes  Problem Relation Age of Onset   Thyroid disease Mother    COPD Mother    Diabetes Father    Hypertension Father    Thrombocytopenia Father    Social History   Socioeconomic History   Marital status: Divorced    Spouse name: Not on file   Number of children: Not on file   Years of education: Not on file   Highest education level: Not on file  Occupational  History   Not on file  Tobacco Use   Smoking status: Former    Types: Cigarettes   Smokeless tobacco: Never   Tobacco comments:    when teenager smoked cigarettes, vaping with flavoring in 2021 until found out pregnant  Vaping Use   Vaping Use: Former  Substance and Sexual Activity   Alcohol use: Yes    Comment: occasionally   Drug use: Not Currently    Types: Marijuana    Comment: marijuana last used October 2021   Sexual activity: Yes    Birth control/protection: None  Other Topics Concern   Not on file  Social History Narrative   Right handed   Drinks caffeine   One story home   Social Determinants of Health   Financial Resource Strain: Not on file  Food Insecurity: No Food Insecurity   Worried About Programme researcher, broadcasting/film/video in the Last Year: Never true   Ran Out of Food in the Last Year: Never true  Transportation Needs: No Transportation Needs   Lack of Transportation (Medical): No   Lack of Transportation (Non-Medical): No  Physical Activity: Not on file  Stress: Not on file  Social Connections: Not on file   Outpatient Medications Prior to Visit  Medication Sig   cyclobenzaprine (FLEXERIL) 10 MG tablet Take 1 tablet (10 mg total) by mouth 3 (three) times daily as needed for muscle spasms.   ibuprofen (ADVIL) 600 MG tablet Take 1 tablet (600 mg total) by mouth every 6 (six) hours as needed for mild pain or moderate pain.   Prenatal 27-1 MG TABS Take 1 tablet by mouth daily.   [DISCONTINUED] PROAIR HFA 108 (90 Base) MCG/ACT inhaler SMARTSIG:2 Puff(s) By Mouth Every 6 Hours   No facility-administered medications prior to visit.   Allergies  Allergen Reactions   Latex Swelling and Rash   Penicillins Rash and Other (See Comments)    Did it involve swelling of the face/tongue/throat, SOB, or low BP? Unknown Did it involve sudden or severe rash/hives, skin peeling, or any reaction on the inside of your mouth or nose? Unknown Did you need to seek medical attention at a  hospital or doctor's office? Unknown When did it last happen?  childhood     If all above answers are "NO", may proceed with cephalosporin use.   Childhood reaction     Immunization History  Administered Date(s) Administered   PFIZER(Purple Top)SARS-COV-2 Vaccination 05/26/2020, 06/16/2020    Health Maintenance  Topic Date Due   Pneumococcal Vaccine 60-52 Years old (1 - PCV) Never done   FOOT EXAM  Never done   OPHTHALMOLOGY EXAM  Never done   URINE MICROALBUMIN  Never done   COVID-19 Vaccine (3 - Booster for Pfizer series) 08/11/2020   INFLUENZA VACCINE  Never done  HEMOGLOBIN A1C  02/11/2021   TETANUS/TDAP  08/14/2021 (Originally 10/02/2003)   PAP SMEAR-Modifier  03/04/2024   Hepatitis C Screening  Completed   HIV Screening  Completed   HPV VACCINES  Aged Out    Patient Care Team: Saphronia Ozdemir, Sung Amabile, NP as PCP - General (Nurse Practitioner)  Review of Systems All review of systems negative except what is listed in the HPI    Objective    BP 122/82   Pulse 84   Ht 5\' 7"  (1.702 m)   Wt 232 lb (105.2 kg)   LMP  (LMP Unknown) Comment: Tubal Ligation  SpO2 96%   BMI 36.34 kg/m  Physical Exam Vitals and nursing note reviewed.  Constitutional:      General: She is not in acute distress.    Appearance: Normal appearance.  Eyes:     Extraocular Movements: Extraocular movements intact.     Conjunctiva/sclera: Conjunctivae normal.     Pupils: Pupils are equal, round, and reactive to light.  Neck:     Vascular: No carotid bruit.  Cardiovascular:     Rate and Rhythm: Normal rate and regular rhythm.     Pulses: Normal pulses.     Heart sounds: Normal heart sounds. No murmur heard. Pulmonary:     Effort: Pulmonary effort is normal.     Breath sounds: Normal breath sounds. No wheezing.  Abdominal:     General: Bowel sounds are normal.     Palpations: Abdomen is soft.  Musculoskeletal:        General: Normal range of motion.     Cervical back: Normal range of motion.      Right lower leg: No edema.     Left lower leg: No edema.  Skin:    General: Skin is warm and dry.     Capillary Refill: Capillary refill takes less than 2 seconds.  Neurological:     General: No focal deficit present.     Mental Status: She is alert and oriented to person, place, and time.  Psychiatric:        Mood and Affect: Mood normal.        Behavior: Behavior normal.        Thought Content: Thought content normal.        Judgment: Judgment normal.     Depression Screen PHQ 2/9 Scores 06/05/2021 04/22/2021 03/07/2021 02/12/2021  PHQ - 2 Score 2 0 0 0  PHQ- 9 Score 8 5 3  0   No results found for any visits on 06/05/21.  Assessment & Plan      Problem List Items Addressed This Visit     Obesity (BMI 30-39.9)    Will start GLP-1 medication today for positive OGTT and for help with weight management Education provided today on medication, administration, storage, expectations, BG monitoring, diet, and activity.  She will f/u in 3 months and we will check A1c      Relevant Medications   Semaglutide,0.25 or 0.5MG /DOS, (OZEMPIC, 0.25 OR 0.5 MG/DOSE,) 2 MG/1.5ML SOPN   Semaglutide,0.25 or 0.5MG /DOS, 2 MG/1.5ML SOPN (Start on 06/26/2021)   Type 2 diabetes mellitus with hyperglycemia, without long-term current use of insulin (HCC) - Primary    Recent positive OGTT for DM Attempt to obtain blood today for A1c, but unsuccessful at finding a vein.  Given recent lab results, we will begin treatment measures off of the OGTT and plan for 3 months f/u to monitor.  BP normal today.  Education on diet and activity  as well as medication, administration, and BG monitoring provided.  I am hopeful that she will have success with GLP-1 for weight and diabetes management and prevention of CV risks.        Relevant Medications   Semaglutide,0.25 or 0.5MG /DOS, (OZEMPIC, 0.25 OR 0.5 MG/DOSE,) 2 MG/1.5ML SOPN   Semaglutide,0.25 or 0.5MG /DOS, 2 MG/1.5ML SOPN (Start on 06/26/2021)   Insulin  Pen Needle (PEN NEEDLES) 32G X 4 MM MISC   Bronchitis    Frequent occurrence. Recent inhaler has run out and she needs a refill. Sent today. No concerns or alarm sx present today.       Relevant Medications   PROAIR HFA 108 (90 Base) MCG/ACT inhaler   Chronic migraine without aura without status migrainosus, not intractable    Hx of chronic migraines with difficulty relieving with OTC medications.  Will trial maxalt as this should not cause significant drowsiness in patient.        Relevant Medications   rizatriptan (MAXALT) 10 MG tablet     Return in about 3 months (around 09/05/2021) for Diabetes.    Time: 60 minutes, >50% spent counseling, care coordination, chart review, and documentation.    Dylen Mcelhannon, Sung Amabile, NP, DNP, AGNP-C Primary Care & Sports Medicine at [facility]

## 2021-06-05 NOTE — Assessment & Plan Note (Signed)
Recent positive OGTT for DM Attempt to obtain blood today for A1c, but unsuccessful at finding a vein.  Given recent lab results, we will begin treatment measures off of the OGTT and plan for 3 months f/u to monitor.  BP normal today.  Education on diet and activity as well as medication, administration, and BG monitoring provided.  I am hopeful that she will have success with GLP-1 for weight and diabetes management and prevention of CV risks.

## 2021-06-05 NOTE — Assessment & Plan Note (Signed)
Frequent occurrence. Recent inhaler has run out and she needs a refill. Sent today. No concerns or alarm sx present today.

## 2021-06-10 ENCOUNTER — Ambulatory Visit: Payer: Medicaid Other | Admitting: Neurology

## 2021-06-11 ENCOUNTER — Other Ambulatory Visit (HOSPITAL_BASED_OUTPATIENT_CLINIC_OR_DEPARTMENT_OTHER): Payer: Self-pay

## 2021-06-11 ENCOUNTER — Other Ambulatory Visit (HOSPITAL_BASED_OUTPATIENT_CLINIC_OR_DEPARTMENT_OTHER): Payer: Self-pay | Admitting: Nurse Practitioner

## 2021-06-11 DIAGNOSIS — E1165 Type 2 diabetes mellitus with hyperglycemia: Secondary | ICD-10-CM

## 2021-06-11 MED ORDER — OZEMPIC (0.25 OR 0.5 MG/DOSE) 2 MG/1.5ML ~~LOC~~ SOPN
PEN_INJECTOR | SUBCUTANEOUS | 1 refills | Status: DC
  Filled 2021-06-11: qty 1.5, 56d supply, fill #0

## 2021-06-12 NOTE — BH Specialist Note (Signed)
Integrated Behavioral Health via Telemedicine Visit  06/12/2021 Katherine Maldonado [id number]  Number of Integrated Behavioral Health visits: 1 Session Start time: 8:18 Session End time: 9:18 Total time: 60  Referring Provider: Madelia Bing, MD Patient/Family location: [place], [place] [facility] Provider location: Center for [facility] at [facility]  All persons participating in visit: [name] and [facility] Katherine Maldonado   Types of Service: Individual psychotherapy and Video visit  I connected with Katherine Maldonado and/or Katherine Maldonado's  n/a  via  Telephone or Video Enabled Telemedicine Application  (Video is Caregility application) and verified that I am speaking with the correct person using two identifiers. Discussed confidentiality: Yes   I discussed the limitations of telemedicine and the availability of in person appointments.  Discussed there is a possibility of technology failure and discussed alternative modes of communication if that failure occurs.  I discussed that engaging in this telemedicine visit, they consent to the provision of behavioral healthcare and the services will be billed under their insurance.  Patient and/or legal guardian expressed understanding and consented to Telemedicine visit: Yes   Presenting Concerns: Patient and/or family reports the following symptoms/concerns: Increase in mood instability, anxiety, depression, escalated with life changes (loss of mother, adjusting to new motherhood after move to a new state with no family support local, then loss of father, working from home, 3-4 hours sleep since childbirth); history of depression and anxiety since childhood. Duration of problem: Increase in less than 2 years; Severity of problem:  moderately severe  Patient and/or Family's Strengths/Protective Factors: Concrete supports in place (healthy food, safe environments, etc.)  Goals Addressed: Patient  will:  Reduce symptoms of: anxiety, depression, mood instability, and stress   Increase knowledge and/or ability of: coping skills and healthy habits   Demonstrate ability to: Increase healthy adjustment to current life circumstances and Increase adequate support systems for patient/family  Progress towards Goals: Ongoing  Interventions: Interventions utilized:  Mindfulness or Management consultant, Sleep Hygiene, Psychoeducation and/or Health Education, and Link to [employer] Standardized Assessments completed:  PHQ9/GAD7 in past two weeks  Patient and/or Family Response: Pt agrees with treatment plan  Assessment: Patient currently experiencing Grief and Mood disorder, unspecified.   Patient may benefit from psychoeducation and brief therapeutic interventions regarding coping with symptoms of anxiety, depression, life stress, unresolved grief .  Plan: Follow up with behavioral health clinician on : Two weeks Behavioral recommendations:  -Accept referral to [facility] for ongoing therapy -CALM relaxation breathing exercise twice daily (morning; at bedtime with sleep sounds) -Consider additional grief support (on After Visit Summary) as needed  Referral(s): Integrated Art gallery manager (In Clinic) and [employer]:  grief support  I discussed the assessment and treatment plan with the patient and/or parent/guardian. They were provided an opportunity to ask questions and all were answered. They agreed with the plan and demonstrated an understanding of the instructions.   They were advised to call back or seek an in-person evaluation if the symptoms worsen or if the condition fails to improve as anticipated.  Katherine Lips, LCSW  Depression screen [facility] 2/9 06/05/2021 04/22/2021 03/07/2021 02/12/2021 01/31/2021  Decreased Interest 1 0 0 0 0  Down, Depressed, Hopeless 1 0 0 0 0  PHQ - 2 Score 2 0 0 0 0  Altered sleeping 2 2 1  0 0  Tired, decreased energy 1 1 1  0 0   Change in appetite 2 2 1  0 0  Feeling bad or failure  about yourself  0 0 0 0 0  Trouble concentrating 1 0 0 0 0  Moving slowly or fidgety/restless 0 0 0 0 0  Suicidal thoughts 0 0 0 0 0  PHQ-9 Score 8 5 3  0 0  Difficult doing work/chores Somewhat difficult - - - -  Some recent data might be hidden   GAD 7 : Generalized Anxiety Score 06/05/2021 04/22/2021 03/07/2021 02/12/2021  Nervous, Anxious, on Edge 2 0 0 0  Control/stop worrying 1 0 0 0  Worry too much - different things 1 0 0 0  Trouble relaxing 1 0 0 0  Restless 0 0 0 0  Easily annoyed or irritable 1 0 0 0  Afraid - awful might happen 0 0 0 0  Total GAD 7 Score 6 0 0 0  Anxiety Difficulty Not difficult at all - - -

## 2021-06-21 ENCOUNTER — Ambulatory Visit (INDEPENDENT_AMBULATORY_CARE_PROVIDER_SITE_OTHER): Payer: Medicaid Other | Admitting: Clinical

## 2021-06-21 DIAGNOSIS — F39 Unspecified mood [affective] disorder: Secondary | ICD-10-CM | POA: Diagnosis not present

## 2021-06-21 DIAGNOSIS — F4321 Adjustment disorder with depressed mood: Secondary | ICD-10-CM

## 2021-06-21 NOTE — Patient Instructions (Signed)
[facility] [address] [place], [place] [address] [phone] (main office) [phone] (Yevette Knust's office)   [facility] (Individual and group grief support) [facility]  1-800-588-8879   /Emotional Wellbeing Apps and Websites Here are a few free apps meant to help you to help yourself.  To find, try searching on the internet to see if the app is offered on Apple/Android devices. If your first choice doesn't come up on your device, the good news is that there are many choices! Play around with different apps to see which ones are helpful to you.    Calm This is an app meant to help increase calm feelings. Includes info, strategies, and tools for tracking your feelings.      Calm Harm  This app is meant to help with self-harm. Provides many 5-minute or 15-min coping strategies for doing instead of hurting yourself.       Healthy Minds Health Minds is a problem-solving tool to help deal with emotions and cope with stress you encounter wherever you are.      MindShift This app can help people cope with anxiety. Rather than trying to avoid anxiety, you can make an important shift and face it.      MY3  MY3 features a support system, safety plan and resources with the goal of offering a tool to use in a time of need.       My Life My Voice  This mood journal offers a simple solution for tracking your thoughts, feelings and moods. Animated emoticons can help identify your mood.       Relax Melodies Designed to help with sleep, on this app you can mix sounds and meditations for relaxation.      Smiling Mind Smiling Mind is meditation made easy: it's a simple tool that helps put a smile on your mind.        Stop, Breathe & Think  A friendly, simple guide for people through meditations for mindfulness and compassion.  Stop, Breathe and Think Kids Enter your current feelings and choose a "mission" to help  you cope. Offers videos for certain moods instead of just sound recordings.       Team Orange The goal of this tool is to help teens change how they think, act, and react. This app helps you focus on your own good feelings and experiences.      The Virtual Hope Box The Virtual Hope Box (VHB) contains simple tools to help patients with coping, relaxation, distraction, and positive thinking.        

## 2021-06-25 NOTE — BH Specialist Note (Signed)
Integrated Behavioral Health via Telemedicine Visit  06/25/2021 Katherine Maldonado [id number]  Number of Integrated Behavioral Health visits: 2 Session Start time: 8:15  Session End time: 8:45 Total time: 30  Referring Provider: Freeborn Bing, MD Patient/Family location: Home [facility] Provider location: [facility] for [facility] at [facility]  All persons participating in visit: [name] and [facility] Katherine Maldonado   Types of Service: Individual psychotherapy and Video visit  I connected with Katherine Maldonado  n/a  via  Telephone or Video Enabled Telemedicine Application  (Video is Caregility application) and verified that I am speaking with the correct person using two identifiers. Discussed confidentiality: Yes   I discussed the limitations of telemedicine and the availability of in person appointments.  Discussed there is a possibility of technology failure and discussed alternative modes of communication if that failure occurs.  I discussed that engaging in this telemedicine visit, they consent to the provision of behavioral healthcare and the services will be billed under their insurance.  Patient and/or legal guardian expressed understanding and consented to Telemedicine visit: Yes   Presenting Concerns: Patient and/or family reports the following symptoms/concerns: Feelings regarding spending first Christmas season without dad; second without mom; baby at FOB's place for holidays, so first holiday alone; uncertainty regarding how to spend future holidays without family. Pt copes best when working.  Duration of problem: loss of brother in 2017, loss of mom in 2021, loss of dad 2022  Patient and/or Family's Strengths/Protective Factors: Concrete supports in place (healthy food, safe environments, etc.)  Goals Addressed: Patient will:  Reduce symptoms of: anxiety, depression, mood instability, and stress    Demonstrate ability to: Increase healthy adjustment to current life circumstances  Progress towards Goals: Ongoing  Interventions: Interventions utilized:  Supportive Reflection Standardized Assessments completed: Not Needed  Patient and/or Family Response: Pt agrees to call back to schedule f/u when new work schedule comes out; has options for ongoing therapy via insurance if unable to schedule with [facility] outside of normal business hours  Assessment: Patient currently experiencing Grief and Mood disorder.   Patient may benefit from continued psychoeducation and brief therapeutic interventions regarding coping with symptoms of anxiety, depression, stress, grief .  Plan: Follow up with behavioral health clinician on : Call Bharat Antillon at [phone], as needed Behavioral recommendations:  -Continue allowing time and space for grieving, including thinking of ways to share positive family memories with daughter as she grows -Continue plan to use insurance options for ongoing therapy for after work hours, if unable to schedule at [facility] at time that works for your schedule Referral(s): Integrated [employer] (In Clinic)  I discussed the assessment and treatment plan with the patient and/or parent/guardian. They were provided an opportunity to ask questions and all were answered. They agreed with the plan and demonstrated an understanding of the instructions.   They were advised to call back or seek an in-person evaluation if the symptoms worsen or if the condition fails to improve as anticipated.  Valetta Close Emberlie Gotcher, LCSW

## 2021-07-09 ENCOUNTER — Other Ambulatory Visit: Payer: Self-pay

## 2021-07-09 ENCOUNTER — Ambulatory Visit: Payer: Medicaid Other | Admitting: Clinical

## 2021-07-09 DIAGNOSIS — F39 Unspecified mood [affective] disorder: Secondary | ICD-10-CM

## 2021-07-09 DIAGNOSIS — F4321 Adjustment disorder with depressed mood: Secondary | ICD-10-CM

## 2021-07-09 NOTE — Patient Instructions (Signed)
[facility] [address] [place], [place] [address] [phone] (main office) [phone] (Dalana Pfahler's office)   

## 2021-09-06 ENCOUNTER — Ambulatory Visit (INDEPENDENT_AMBULATORY_CARE_PROVIDER_SITE_OTHER): Payer: Medicaid Other | Admitting: Nurse Practitioner

## 2021-09-06 ENCOUNTER — Other Ambulatory Visit: Payer: Self-pay

## 2021-09-06 ENCOUNTER — Encounter (HOSPITAL_BASED_OUTPATIENT_CLINIC_OR_DEPARTMENT_OTHER): Payer: Self-pay | Admitting: Nurse Practitioner

## 2021-09-06 ENCOUNTER — Other Ambulatory Visit (HOSPITAL_BASED_OUTPATIENT_CLINIC_OR_DEPARTMENT_OTHER): Payer: Self-pay

## 2021-09-06 VITALS — HR 88 | Ht 66.0 in | Wt 221.0 lb

## 2021-09-06 DIAGNOSIS — E1165 Type 2 diabetes mellitus with hyperglycemia: Secondary | ICD-10-CM

## 2021-09-06 DIAGNOSIS — R112 Nausea with vomiting, unspecified: Secondary | ICD-10-CM | POA: Diagnosis not present

## 2021-09-06 DIAGNOSIS — E669 Obesity, unspecified: Secondary | ICD-10-CM

## 2021-09-06 MED ORDER — ONDANSETRON 8 MG PO TBDP
8.0000 mg | ORAL_TABLET | Freq: Three times a day (TID) | ORAL | 3 refills | Status: AC | PRN
Start: 2021-09-06 — End: ?
  Filled 2021-09-06: qty 30, 10d supply, fill #0

## 2021-09-06 MED ORDER — METFORMIN HCL 500 MG PO TABS
500.0000 mg | ORAL_TABLET | Freq: Every day | ORAL | 3 refills | Status: DC
  Filled 2021-09-06: qty 30, 30d supply, fill #0

## 2021-09-06 MED ORDER — PROMETHAZINE HCL 25 MG PO TABS
ORAL_TABLET | ORAL | 2 refills | Status: AC
Start: 2021-09-06 — End: ?
  Filled 2021-09-06: qty 30, 7d supply, fill #0

## 2021-09-06 NOTE — Patient Instructions (Signed)
I have sent the referral to bariatric surgery for evaluation. They will call you to set this up.   Stop the Ozempic. I have sent in phenergan and zofran for you if you start to have more nausea.  Start metformin- take 1 tab by mouth at bedtime.  Let me know if the metformin causes any issues.   Your sugars are looking great!!! Hopefully we will get this stabilized for you.

## 2021-09-06 NOTE — Progress Notes (Signed)
Established Patient Office Visit  Subjective:  Patient ID: Katherine Maldonado, female    DOB: 10/04/1985  Age: 36 y.o. MRN: [id number]  CC:  Chief Complaint  Patient presents with   Follow-up    Patient presents today for follow up of diabetic meds. She has not been checking her blood glucose. She stated the Ozempic is making her sick everyday for the past 3 months. She would like a referral for a gastric sleeve.     HPI Katherine Maldonado presents for follow-up for DM.  Katherine Maldonado has been on metformin for the past 3 months, but endorses constant nausea while on this medication. She endorses a sour stomach and increased gas with a "rotten egg" odor. She tells me that the zofran has not been helpful to mitigate the symptoms. She has not been following a diabetic diet and is not checking her BG at home. She would like to discuss the option of a gastric sleeve to see if this would help with her weight loss and control of her BG levels. She has not been able to lose weight on her own. She denies paresthesias, vision changes, increased hunger, thirst, or urination.   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--partner declined testing   Depression    Encounter for induction of labor 02/13/2021   Encounter for postoperative care 05/06/2021   Encounter for postpartum visit 03/04/2021   Flank pain 05/06/2021   GBS (group B Streptococcus carrier), +RV culture, currently pregnant 02/06/2021   Headache in pregnancy, antepartum, second trimester 09/12/2020   History of gestational diabetes    none since 02-14-2021 childbirth   History of pre-eclampsia    none since 02-14-2021 childbirth   Migraines    Numbness 04/17/2021   and burning of left thigh at times   Obesity in pregnancy 12/21/2020   Preeclampsia,  severe, third trimester 01/30/2021   Preoperative exam for gynecologic surgery 04/22/2021   Unwanted fertility 12/06/2020   btl papers 12/06/20    Wears contact lenses     Past Surgical History:  Procedure Laterality Date   LAPAROSCOPIC BILATERAL SALPINGECTOMY Bilateral 04/24/2021   Procedure: LAPAROSCOPIC BILATERAL SALPINGECTOMY;  Surgeon: Warden Fillers, MD;  Location: [facility];  Service: Gynecology;  Laterality: Bilateral;   TOOTH EXTRACTION     last done 2020    Family History  Adopted: Yes  Problem Relation Age of Onset   Thyroid disease Mother    COPD Mother    Diabetes Father    Hypertension Father    Thrombocytopenia Father     Social History   Socioeconomic History   Marital status: Divorced    Spouse name: Not on file   Number of children: Not on file   Years of education: Not on file   Highest education level: Not on file  Occupational History   Not on file  Tobacco Use   Smoking status: Former    Types: Cigarettes   Smokeless tobacco: Never   Tobacco comments:    when teenager smoked cigarettes, vaping with flavoring in 2021 until found out pregnant  Vaping Use   Vaping Use: Former  Substance and Sexual Activity   Alcohol use: Yes    Comment: occasionally   Drug use: Not Currently  Types: Marijuana    Comment: marijuana last used October 2021   Sexual activity: Yes    Birth control/protection: None  Other Topics Concern   Not on file  Social History Narrative   Right handed   Drinks caffeine   One story home   Social Determinants of Health   Financial Resource Strain: Not on file  Food Insecurity: No Food Insecurity   Worried About Programme researcher, broadcasting/film/video in the Last Year: Never true   Ran Out of Food in the Last Year: Never true  Transportation Needs: No Transportation Needs   Lack of Transportation (Medical): No   Lack of Transportation (Non-Medical): No  Physical Activity: Not on file  Stress: Not on file  Social  Connections: Not on file  Intimate Partner Violence: Not At Risk   Fear of Current or Ex-Partner: No   Emotionally Abused: No   Physically Abused: No   Sexually Abused: No    Outpatient Medications Prior to Visit  Medication Sig Dispense Refill   albuterol (VENTOLIN HFA) 108 (90 Base) MCG/ACT inhaler Inhale 2 puffs into the lungs every 6 (six) hours as needed for wheezing. 2 each 11   cyclobenzaprine (FLEXERIL) 10 MG tablet Take 1 tablet (10 mg total) by mouth 3 (three) times daily as needed for muscle spasms. 30 tablet 0   ibuprofen (ADVIL) 600 MG tablet Take 1 tablet (600 mg total) by mouth every 6 (six) hours as needed for mild pain or moderate pain. 30 tablet 2   Insulin Pen Needle (PEN NEEDLES) 32G X 4 MM MISC USE ONE NEW NEEDLE PER WEEK WITH INJECTION OF OZEMPIC 90 each 11   Prenatal 27-1 MG TABS Take 1 tablet by mouth daily. 30 tablet 11   rizatriptan (MAXALT) 10 MG tablet Take 1 tablet (10 mg total) by mouth as needed for migraine. May repeat in 2 hours if needed for ongoing pain. Do not take more than 2 doses in a 24 hour period. 10 tablet 0   Semaglutide,0.25 or 0.5MG /DOS, (OZEMPIC, 0.25 OR 0.5 MG/DOSE,) 2 MG/1.5ML SOPN Inject 0.25 mg into the skin once a week for 28 days, THEN 0.5 mg once a week for 14 days 1.5 mL 1   No facility-administered medications prior to visit.    Allergies  Allergen Reactions   Latex Swelling and Rash   Penicillins Rash and Other (See Comments)    Did it involve swelling of the face/tongue/throat, SOB, or low BP? Unknown Did it involve sudden or severe rash/hives, skin peeling, or any reaction on the inside of your mouth or nose? Unknown Did you need to seek medical attention at a hospital or doctor's office? Unknown When did it last happen?  childhood     If all above answers are "NO", may proceed with cephalosporin use.   Childhood reaction     ROS Review of Systems All review of systems negative except what is listed in the HPI     Objective:    Physical Exam Vitals and nursing note reviewed.  Constitutional:      Appearance: Normal appearance. She is obese.  HENT:     Head: Normocephalic.  Eyes:     Extraocular Movements: Extraocular movements intact.     Conjunctiva/sclera: Conjunctivae normal.     Pupils: Pupils are equal, round, and reactive to light.  Neck:     Vascular: No carotid bruit.  Cardiovascular:     Rate and Rhythm: Normal rate and regular rhythm.  Pulses: Normal pulses.     Heart sounds: Normal heart sounds.  Pulmonary:     Effort: Pulmonary effort is normal.     Breath sounds: Normal breath sounds.  Abdominal:     General: Bowel sounds are normal. There is no distension.     Tenderness: There is no abdominal tenderness. There is no guarding.  Musculoskeletal:     Cervical back: Normal range of motion.     Right lower leg: No edema.     Left lower leg: No edema.  Skin:    General: Skin is warm and dry.     Capillary Refill: Capillary refill takes less than 2 seconds.  Neurological:     General: No focal deficit present.     Mental Status: She is alert and oriented to person, place, and time.  Psychiatric:        Mood and Affect: Mood normal.        Behavior: Behavior normal.        Thought Content: Thought content normal.        Judgment: Judgment normal.    Pulse 88    Ht 5\' 6"  (1.676 m)    Wt 221 lb (100.2 kg)    SpO2 98%    BMI 35.67 kg/m  Wt Readings from Last 3 Encounters:  09/06/21 221 lb (100.2 kg)  06/05/21 232 lb (105.2 kg)  05/06/21 225 lb (102.1 kg)      Assessment & Plan:   Problem List Items Addressed This Visit     Obesity (BMI 30-39.9)    BMI 35.67 today. She has recently had a baby so I do feel that some of her additional body weight is a result of recent childbirth.  She is having difficulty losing weight with diet, but it is unclear if her caloric, carbohydrate, and fat intake are well balanced.  Will send referral for evaluation for gastric bypass.  We can always retry a GLP-1 in the future if she is still having difficulty with weight loss and her BG levels are not well controlled. She may benefit from a different formulation than semaglutide.       Relevant Medications   metFORMIN (GLUCOPHAGE) 500 MG tablet   Other Relevant Orders   Amb Referral to Bariatric Surgery   Type 2 diabetes mellitus with hyperglycemia, without long-term current use of insulin (HCC)    Current A1c down to 5.4%. Ozempic helping BG numbers but not well tolerated by the patient due to increased nausea and vomiting.  Will plan to stop ozempic and transition to metformin to keep BG levels under control.  Recommend dietary changes to include no more than 150 grams of carbohydrates and 13 grams of saturated fat a day.  Recommend increasing fiber in diet to reach a goal of about 30 grams a day. Increase water intake.  Recommend daily activity, such as walking, at least 30 minutes a day.  At this time, I feel it is OK that she is not checking daily BG, but if her A1c starts to creep up, we can discuss options for this. She may benefit from CGM if necessary.       Relevant Medications   metFORMIN (GLUCOPHAGE) 500 MG tablet   ondansetron (ZOFRAN-ODT) 8 MG disintegrating tablet   Other Relevant Orders   Amb Referral to Bariatric Surgery   Other Visit Diagnoses     Nausea and vomiting, unspecified vomiting type    -  Primary   Relevant Medications  promethazine (PHENERGAN) 25 MG tablet       Meds ordered this encounter  Medications   promethazine (PHENERGAN) 25 MG tablet    Sig: Take 1/2 tab (12.5mg ) to 1 tab (25mg ) every 6 hours as needed for vomiting not controlled with zofran.    Dispense:  30 tablet    Refill:  2   metFORMIN (GLUCOPHAGE) 500 MG tablet    Sig: Take 1 tablet (500 mg total) by mouth at bedtime.    Dispense:  90 tablet    Refill:  3   ondansetron (ZOFRAN-ODT) 8 MG disintegrating tablet    Sig: Take 1 tablet (8 mg total) by mouth every 8  (eight) hours as needed for nausea.    Dispense:  30 tablet    Refill:  3    Follow-up: Return in about 4 months (around 01/04/2022) for Diabetes.    01/06/2022, NP

## 2021-09-11 NOTE — Assessment & Plan Note (Signed)
Current A1c down to 5.4%. Ozempic helping BG numbers but not well tolerated by the patient due to increased nausea and vomiting.  ?Will plan to stop ozempic and transition to metformin to keep BG levels under control.  ?Recommend dietary changes to include no more than 150 grams of carbohydrates and 13 grams of saturated fat a day.  ?Recommend increasing fiber in diet to reach a goal of about 30 grams a day. Increase water intake.  ?Recommend daily activity, such as walking, at least 30 minutes a day.  ?At this time, I feel it is OK that she is not checking daily BG, but if her A1c starts to creep up, we can discuss options for this. She may benefit from CGM if necessary.  ?

## 2021-09-11 NOTE — Assessment & Plan Note (Signed)
BMI 35.67 today. She has recently had a baby so I do feel that some of her additional body weight is a result of recent childbirth.  She is having difficulty losing weight with diet, but it is unclear if her caloric, carbohydrate, and fat intake are well balanced.  ?Will send referral for evaluation for gastric bypass. We can always retry a GLP-1 in the future if she is still having difficulty with weight loss and her BG levels are not well controlled. She may benefit from a different formulation than semaglutide.  ?

## 2021-09-24 ENCOUNTER — Encounter (HOSPITAL_BASED_OUTPATIENT_CLINIC_OR_DEPARTMENT_OTHER): Payer: Self-pay | Admitting: Nurse Practitioner

## 2021-09-25 ENCOUNTER — Other Ambulatory Visit (HOSPITAL_BASED_OUTPATIENT_CLINIC_OR_DEPARTMENT_OTHER): Payer: Self-pay | Admitting: Nurse Practitioner

## 2021-09-25 ENCOUNTER — Other Ambulatory Visit (HOSPITAL_BASED_OUTPATIENT_CLINIC_OR_DEPARTMENT_OTHER): Payer: Self-pay

## 2021-09-25 DIAGNOSIS — K219 Gastro-esophageal reflux disease without esophagitis: Secondary | ICD-10-CM

## 2021-09-25 MED ORDER — PANTOPRAZOLE SODIUM 40 MG PO TBEC
40.0000 mg | DELAYED_RELEASE_TABLET | Freq: Every day | ORAL | 3 refills | Status: DC
  Filled 2021-09-25: qty 30, 30d supply, fill #0

## 2021-09-29 ENCOUNTER — Encounter (HOSPITAL_BASED_OUTPATIENT_CLINIC_OR_DEPARTMENT_OTHER): Payer: Self-pay | Admitting: Nurse Practitioner

## 2021-10-01 ENCOUNTER — Encounter (HOSPITAL_BASED_OUTPATIENT_CLINIC_OR_DEPARTMENT_OTHER): Payer: Self-pay | Admitting: Nurse Practitioner

## 2021-10-04 ENCOUNTER — Telehealth: Payer: Medicaid Other | Admitting: Physician Assistant

## 2021-10-04 DIAGNOSIS — J019 Acute sinusitis, unspecified: Secondary | ICD-10-CM

## 2021-10-04 DIAGNOSIS — B9689 Other specified bacterial agents as the cause of diseases classified elsewhere: Secondary | ICD-10-CM

## 2021-10-04 DIAGNOSIS — R519 Headache, unspecified: Secondary | ICD-10-CM

## 2021-10-04 MED ORDER — AZITHROMYCIN 250 MG PO TABS: ORAL_TABLET | ORAL | 0 refills | Status: AC

## 2021-10-04 MED ORDER — PREDNISONE 10 MG (21) PO TBPK: ORAL_TABLET | ORAL | 0 refills | Status: DC

## 2021-10-04 NOTE — Patient Instructions (Signed)
?Katherine Maldonado, thank you for joining Katherine Loveless, PA-C for today's virtual visit.  While this provider is not your primary care provider (PCP), if your PCP is located in our provider database this encounter information will be shared with them immediately following your visit. ? ?Consent: ?(Patient) Katherine Maldonado provided verbal consent for this virtual visit at the beginning of the encounter. ? ?Current Medications: ? ?Current Outpatient Medications:  ?  azithromycin (ZITHROMAX) 250 MG tablet, Take 2 tablets on day 1, then 1 tablet daily on days 2 through 5, Disp: 6 tablet, Rfl: 0 ?  predniSONE (STERAPRED UNI-PAK 21 TAB) 10 MG (21) TBPK tablet, 6 day taper; take as directed on package instructions, Disp: 21 tablet, Rfl: 0 ?  albuterol (VENTOLIN HFA) 108 (90 Base) MCG/ACT inhaler, Inhale 2 puffs into the lungs every 6 (six) hours as needed for wheezing., Disp: 2 each, Rfl: 11 ?  cyclobenzaprine (FLEXERIL) 10 MG tablet, Take 1 tablet (10 mg total) by mouth 3 (three) times daily as needed for muscle spasms., Disp: 30 tablet, Rfl: 0 ?  ibuprofen (ADVIL) 600 MG tablet, Take 1 tablet (600 mg total) by mouth every 6 (six) hours as needed for mild pain or moderate pain., Disp: 30 tablet, Rfl: 2 ?  Insulin Pen Needle (PEN NEEDLES) 32G X 4 MM MISC, USE ONE NEW NEEDLE PER WEEK WITH INJECTION OF OZEMPIC, Disp: 90 each, Rfl: 11 ?  metFORMIN (GLUCOPHAGE) 500 MG tablet, Take 1 tablet (500 mg total) by mouth at bedtime., Disp: 90 tablet, Rfl: 3 ?  ondansetron (ZOFRAN-ODT) 8 MG disintegrating tablet, Take 1 tablet (8 mg total) by mouth every 8 (eight) hours as needed for nausea., Disp: 30 tablet, Rfl: 3 ?  pantoprazole (PROTONIX) 40 MG tablet, Take 1 tablet (40 mg total) by mouth daily., Disp: 30 tablet, Rfl: 3 ?  Prenatal 27-1 MG TABS, Take 1 tablet by mouth daily., Disp: 30 tablet, Rfl: 11 ?  promethazine (PHENERGAN) 25 MG tablet, Take 1/2 tab (12.5mg ) to 1 tab (25mg ) every 6 hours as needed for vomiting not  controlled with zofran., Disp: 30 tablet, Rfl: 2 ?  rizatriptan (MAXALT) 10 MG tablet, Take 1 tablet (10 mg total) by mouth as needed for migraine. May repeat in 2 hours if needed for ongoing pain. Do not take more than 2 doses in a 24 hour period., Disp: 10 tablet, Rfl: 0  ? ?Medications ordered in this encounter:  ?Meds ordered this encounter  ?Medications  ? predniSONE (STERAPRED UNI-PAK 21 TAB) 10 MG (21) TBPK tablet  ?  Sig: 6 day taper; take as directed on package instructions  ?  Dispense:  21 tablet  ?  Refill:  0  ?  Order Specific Question:   Supervising Provider  ?  Answer:   [[id number]]  ? azithromycin (ZITHROMAX) 250 MG tablet  ?  Sig: Take 2 tablets on day 1, then 1 tablet daily on days 2 through 5  ?  Dispense:  6 tablet  ?  Refill:  0  ?  Order Specific Question:   Supervising Provider  ?  Answer:   Eber Hong [[id number]]  ?  ? ?*If you need refills on other medications prior to your next appointment, please contact your pharmacy* ? ?Follow-Up: ?Call back or seek an in-person evaluation if the symptoms worsen or if the condition fails to improve as anticipated. ? ?Other Instructions ? ?Migraine Headache ?A migraine headache is an intense, throbbing pain on one side or  both sides of the head. Migraine headaches may also cause other symptoms, such as nausea, vomiting, and sensitivity to light and noise. A migraine headache can last from 4 hours to 3 days. Talk with your doctor about what things may bring on (trigger) your migraine headaches. ?What are the causes? ?The exact cause of this condition is not known. However, a migraine may be caused when nerves in the brain become irritated and release chemicals that cause inflammation of blood vessels. This inflammation causes pain. This condition may be triggered or caused by: ?Drinking alcohol. ?Smoking. ?Taking medicines, such as: ?Medicine used to treat chest pain (nitroglycerin). ?Birth control pills. ?Estrogen. ?Certain blood pressure  medicines. ?Eating or drinking products that contain nitrates, glutamate, aspartame, or tyramine. Aged cheeses, chocolate, or caffeine may also be triggers. ?Doing physical activity. ?Other things that may trigger a migraine headache include: ?Menstruation. ?Pregnancy. ?Hunger. ?Stress. ?Lack of sleep or too much sleep. ?Weather changes. ?Fatigue. ?What increases the risk? ?The following factors may make you more likely to experience migraine headaches: ?Being a certain age. This condition is more common in people who are 61-45 years old. ?Being female. ?Having a family history of migraine headaches. ?Being Caucasian. ?Having a mental health condition, such as depression or anxiety. ?Being obese. ?What are the signs or symptoms? ?The main symptom of this condition is pulsating or throbbing pain. This pain may: ?Happen in any area of the head, such as on one side or both sides. ?Interfere with daily activities. ?Get worse with physical activity. ?Get worse with exposure to bright lights or loud noises. ?Other symptoms may include: ?Nausea. ?Vomiting. ?Dizziness. ?General sensitivity to bright lights, loud noises, or smells. ?Before you get a migraine headache, you may get warning signs (an aura). An aura may include: ?Seeing flashing lights or having blind spots. ?Seeing bright spots, halos, or zigzag lines. ?Having tunnel vision or blurred vision. ?Having numbness or a tingling feeling. ?Having trouble talking. ?Having muscle weakness. ?Some people have symptoms after a migraine headache (postdromal phase), such as: ?Feeling tired. ?Difficulty concentrating. ?How is this diagnosed? ?A migraine headache can be diagnosed based on: ?Your symptoms. ?A physical exam. ?Tests, such as: ?CT scan or an MRI of the head. These imaging tests can help rule out other causes of headaches. ?Taking fluid from the spine (lumbar puncture) and analyzing it (cerebrospinal fluid analysis, or CSF analysis). ?How is this treated? ?This  condition may be treated with medicines that: ?Relieve pain. ?Relieve nausea. ?Prevent migraine headaches. ?Treatment for this condition may also include: ?Acupuncture. ?Lifestyle changes like avoiding foods that trigger migraine headaches. ?Biofeedback. ?Cognitive behavioral therapy. ?Follow these instructions at home: ?Medicines ?Take over-the-counter and prescription medicines only as told by your health care provider. ?Ask your health care provider if the medicine prescribed to you: ?Requires you to avoid driving or using heavy machinery. ?Can cause constipation. You may need to take these actions to prevent or treat constipation: ?Drink enough fluid to keep your urine pale yellow. ?Take over-the-counter or prescription medicines. ?Eat foods that are high in fiber, such as beans, whole grains, and fresh fruits and vegetables. ?Limit foods that are high in fat and processed sugars, such as fried or sweet foods. ?Lifestyle ?Do not drink alcohol. ?Do not use any products that contain nicotine or tobacco, such as cigarettes, e-cigarettes, and chewing tobacco. If you need help quitting, ask your health care provider. ?Get at least 8 hours of sleep every night. ?Find ways to manage stress, such as meditation, deep  breathing, or yoga. ?General instructions ?  ?Keep a journal to find out what may trigger your migraine headaches. For example, write down: ?What you eat and drink. ?How much sleep you get. ?Any change to your diet or medicines. ?If you have a migraine headache: ?Avoid things that make your symptoms worse, such as bright lights. ?It may help to lie down in a dark, quiet room. ?Do not drive or use heavy machinery. ?Ask your health care provider what activities are safe for you while you are experiencing symptoms. ?Keep all follow-up visits as told by your health care provider. This is important. ?Contact a health care provider if: ?You develop symptoms that are different or more severe than your usual  migraine headache symptoms. ?You have more than 15 headache days in one month. ?Get help right away if: ?Your migraine headache becomes severe. ?Your migraine headache lasts longer than 72 hours. ?You have a fever. ?You

## 2021-10-04 NOTE — Progress Notes (Signed)
?Virtual Visit Consent  ? ?Katherine Maldonado, you are scheduled for a virtual visit with a [facility] provider today.   ?  ?Just as with appointments in the office, your consent must be obtained to participate.  Your consent will be active for this visit and any virtual visit you may have with one of our providers in the next 365 days.   ?  ?If you have a MyChart account, a copy of this consent can be sent to you electronically.  All virtual visits are billed to your insurance company just like a traditional visit in the office.   ? ?As this is a virtual visit, video technology does not allow for your provider to perform a traditional examination.  This may limit your provider's ability to fully assess your condition.  If your provider identifies any concerns that need to be evaluated in person or the need to arrange testing (such as labs, EKG, etc.), we will make arrangements to do so.   ?  ?Although advances in technology are sophisticated, we cannot ensure that it will always work on either your end or our end.  If the connection with a video visit is poor, the visit may have to be switched to a telephone visit.  With either a video or telephone visit, we are not always able to ensure that we have a secure connection.    ? ?I need to obtain your verbal consent now.   Are you willing to proceed with your visit today?  ?  ?AHUVA POYNOR has provided verbal consent on 10/04/2021 for a virtual visit (video or telephone). ?  ?[name], PA-C  ? ?Date: 10/04/2021 4:11 PM ? ? ?Virtual Visit via Video Note  ? ?IMargaretann Loveless, connected with  Katherine Maldonado  ([id number], 08/18/1985) on 10/04/21 at  4:00 PM EDT by a video-enabled telemedicine application and verified that I am speaking with the correct person using two identifiers. ? ?Location: ?Patient: Virtual Visit Location Patient: Home ?Provider: Virtual Visit Location Provider: Office/Clinic ?  ?I discussed the limitations of evaluation and  management by telemedicine and the availability of in person appointments. The patient expressed understanding and agreed to proceed.   ? ?History of Present Illness: ?Katherine Maldonado is a 36 y.o. who identifies as a female who was assigned female at birth, and is being seen today for possible sinus infection. ? ?HPI: Sinusitis ?This is a new problem. The current episode started in the past 7 days (Sunday). The problem has been gradually worsening since onset. There has been no fever. Associated symptoms include ear pain, headaches and sinus pressure (left sided). Pertinent negatives include no chills, congestion, coughing, hoarse voice, shortness of breath, sneezing or sore throat. (Pain is all left sided. Pain is noted in the left maxillary sinus, left frontal sinus, left nasal passageway, left ear, and left roof of mouth/teeth. Pain/pressure with bending forward that makes her feel like she is going to pass out) Past treatments include oral decongestants and acetaminophen. The treatment provided no relief.   ?She is not breastfeeding. ? ?Problems:  ?Patient Active Problem List  ? Diagnosis Date Noted  ? Type 2 diabetes mellitus with hyperglycemia, without long-term current use of insulin (HCC) 06/05/2021  ? Bronchitis 06/05/2021  ? Chronic migraine without aura without status migrainosus, not intractable 06/05/2021  ? Gestational diabetes mellitus (GDM) requiring insulin 12/24/2020  ? Obesity (BMI 30-39.9) 12/21/2020  ?  ?Allergies:  ?Allergies  ?Allergen Reactions  ?  Latex Swelling and Rash  ? Penicillins Rash and Other (See Comments)  ?  Did it involve swelling of the face/tongue/throat, SOB, or low BP? Unknown ?Did it involve sudden or severe rash/hives, skin peeling, or any reaction on the inside of your mouth or nose? Unknown ?Did you need to seek medical attention at a hospital or doctor's office? Unknown ?When did it last happen?  childhood     ?If all above answers are "NO", may proceed with  cephalosporin use. ? ? ?Childhood reaction ?  ? ?Medications:  ?Current Outpatient Medications:  ?  azithromycin (ZITHROMAX) 250 MG tablet, Take 2 tablets on day 1, then 1 tablet daily on days 2 through 5, Disp: 6 tablet, Rfl: 0 ?  predniSONE (STERAPRED UNI-PAK 21 TAB) 10 MG (21) TBPK tablet, 6 day taper; take as directed on package instructions, Disp: 21 tablet, Rfl: 0 ?  albuterol (VENTOLIN HFA) 108 (90 Base) MCG/ACT inhaler, Inhale 2 puffs into the lungs every 6 (six) hours as needed for wheezing., Disp: 2 each, Rfl: 11 ?  cyclobenzaprine (FLEXERIL) 10 MG tablet, Take 1 tablet (10 mg total) by mouth 3 (three) times daily as needed for muscle spasms., Disp: 30 tablet, Rfl: 0 ?  ibuprofen (ADVIL) 600 MG tablet, Take 1 tablet (600 mg total) by mouth every 6 (six) hours as needed for mild pain or moderate pain., Disp: 30 tablet, Rfl: 2 ?  Insulin Pen Needle (PEN NEEDLES) 32G X 4 MM MISC, USE ONE NEW NEEDLE PER WEEK WITH INJECTION OF OZEMPIC, Disp: 90 each, Rfl: 11 ?  metFORMIN (GLUCOPHAGE) 500 MG tablet, Take 1 tablet (500 mg total) by mouth at bedtime., Disp: 90 tablet, Rfl: 3 ?  ondansetron (ZOFRAN-ODT) 8 MG disintegrating tablet, Take 1 tablet (8 mg total) by mouth every 8 (eight) hours as needed for nausea., Disp: 30 tablet, Rfl: 3 ?  pantoprazole (PROTONIX) 40 MG tablet, Take 1 tablet (40 mg total) by mouth daily., Disp: 30 tablet, Rfl: 3 ?  Prenatal 27-1 MG TABS, Take 1 tablet by mouth daily., Disp: 30 tablet, Rfl: 11 ?  promethazine (PHENERGAN) 25 MG tablet, Take 1/2 tab (12.5mg ) to 1 tab ( ) every 6 hours as needed for vomiting not controlled with zofran., Disp: 30 tablet, Rfl: 2 ?  rizatriptan (MAXALT) 10 MG tablet, Take 1 tablet (10 mg total) by mouth as needed for migraine. May repeat in 2 hours if needed for ongoing pain. Do not take more than 2 doses in a 24 hour period., Disp: 10 tablet, Rfl: 0 ? ?Observations/Objective: ?Patient is well-developed, well-nourished in no acute distress.  ?Resting  comfortably at home.  ?Head is normocephalic, atraumatic.  ?No labored breathing. ?Speech is clear and coherent with logical content.  ?Patient is alert and oriented at baseline.  ? ? ?Assessment and Plan: ?1. Left-sided headache ?- predniSONE (STERAPRED UNI-PAK 21 TAB) 10 MG (21) TBPK tablet; 6 day taper; take as directed on package instructions  Dispense: 21 tablet; Refill: 0 ? ?2. Acute bacterial sinusitis ?- predniSONE (STERAPRED UNI-PAK 21 TAB) 10 MG (21) TBPK tablet; 6 day taper; take as directed on package instructions  Dispense: 21 tablet; Refill: 0 ?- azithromycin (ZITHROMAX) 250 MG tablet; Take 2 tablets on day 1, then 1 tablet daily on days 2 through 5  Dispense: 6 tablet; Refill: 0 ? ?- Possible atypical sinus/migraine presentation ?- Will give prednisone for possible status migrainous migraine (atypical presentation for her as migraines are mostly right sided) ?- Zpack is normal treatment for her  sinus infections and works well for her (discussed incomplete coverage, but has worked for her in the past and she tolerates so she prefers this) (this is also an atypical presentation of her sinus infections as she normally has full facial pressure with congestion and rhinorrhea/drainage, which currently she does not have) ?- Steam treatments and humidifier can help  ?- Tylenol as needed ?- Seek in person evaluation if symptoms persist or worsen ? ?Follow Up Instructions: ?I discussed the assessment and treatment plan with the patient. The patient was provided an opportunity to ask questions and all were answered. The patient agreed with the plan and demonstrated an understanding of the instructions.  A copy of instructions were sent to the patient via MyChart unless otherwise noted below.  ? ?Patient has requested to receive PHI (AVS, Work Notes, etc) pertaining to this video visit through e-mail as they are currently without active MyChart. They have voiced understand that email is not considered secure and  their health information could be viewed by someone other than the patient.  ? ?The patient was advised to call back or seek an in-person evaluation if the symptoms worsen or if the condition fails to improve as ant

## 2021-10-22 ENCOUNTER — Emergency Department (HOSPITAL_BASED_OUTPATIENT_CLINIC_OR_DEPARTMENT_OTHER)
Admission: EM | Admit: 2021-10-22 | Discharge: 2021-10-22 | Disposition: A | Discharge status: Home / Self Care | Payer: Medicaid Other | Attending: Emergency Medicine | Admitting: Emergency Medicine

## 2021-10-22 ENCOUNTER — Emergency Department (HOSPITAL_BASED_OUTPATIENT_CLINIC_OR_DEPARTMENT_OTHER): Payer: Medicaid Other

## 2021-10-22 ENCOUNTER — Encounter (HOSPITAL_BASED_OUTPATIENT_CLINIC_OR_DEPARTMENT_OTHER): Payer: Self-pay

## 2021-10-22 ENCOUNTER — Other Ambulatory Visit: Payer: Self-pay

## 2021-10-22 DIAGNOSIS — Z91040 Latex allergy status: Secondary | ICD-10-CM | POA: Diagnosis not present

## 2021-10-22 DIAGNOSIS — R0981 Nasal congestion: Secondary | ICD-10-CM | POA: Insufficient documentation

## 2021-10-22 DIAGNOSIS — Z7984 Long term (current) use of oral hypoglycemic drugs: Secondary | ICD-10-CM | POA: Diagnosis not present

## 2021-10-22 DIAGNOSIS — Z794 Long term (current) use of insulin: Secondary | ICD-10-CM | POA: Diagnosis not present

## 2021-10-22 DIAGNOSIS — E1165 Type 2 diabetes mellitus with hyperglycemia: Secondary | ICD-10-CM | POA: Insufficient documentation

## 2021-10-22 DIAGNOSIS — R519 Headache, unspecified: Secondary | ICD-10-CM | POA: Diagnosis present

## 2021-10-22 MED ORDER — DEXAMETHASONE SODIUM PHOSPHATE 10 MG/ML IJ SOLN
10.0000 mg | Freq: Once | INTRAMUSCULAR | Status: AC
  Administered 2021-10-22: 10 mg via INTRAVENOUS
  Filled 2021-10-22: qty 1

## 2021-10-22 MED ORDER — PROCHLORPERAZINE EDISYLATE 10 MG/2ML IJ SOLN
10.0000 mg | Freq: Once | INTRAMUSCULAR | Status: AC
  Administered 2021-10-22: 10 mg via INTRAVENOUS
  Filled 2021-10-22: qty 2

## 2021-10-22 MED ORDER — DIPHENHYDRAMINE HCL 50 MG/ML IJ SOLN
25.0000 mg | Freq: Once | INTRAMUSCULAR | Status: AC
  Administered 2021-10-22: 25 mg via INTRAVENOUS
  Filled 2021-10-22: qty 1

## 2021-10-22 MED ORDER — KETOROLAC TROMETHAMINE 15 MG/ML IJ SOLN
15.0000 mg | Freq: Once | INTRAMUSCULAR | Status: AC
  Administered 2021-10-22: 15 mg via INTRAVENOUS
  Filled 2021-10-22: qty 1

## 2021-10-22 MED ORDER — SODIUM CHLORIDE 0.9 % IV BOLUS
500.0000 mL | Freq: Once | INTRAVENOUS | Status: AC
  Administered 2021-10-22: 500 mL via INTRAVENOUS

## 2021-10-22 NOTE — ED Notes (Signed)
Current pain level 0/10 post migraine cocktail. MD made aware  ?

## 2021-10-22 NOTE — ED Provider Notes (Signed)
?[facility]PT ?Provider Note ? ? ?CSN: [id number] ?Arrival date & time: 10/22/21  1747 ? ?  ? ?History ? ?Chief Complaint  ?Patient presents with  ? Headache  ? ? ?Cheryl Flashlizabeth D Talerico is a 36 y.o. female. ? ?HPI ? ?  ? ?36 year old female comes in with chief complaint of headache ?Indicates that she been having headaches for at least the last 2 or 3 weeks.  She has been having sinus congestion for the entire duration was started on antibiotics, but despite taking the antibiotics her headaches have persisted. ? ?She is about 8 months postpartum and had preeclampsia during her pregnancy along with gestational diabetes.  Her BP was running little higher at home which got her concerned. ? ?Review of system is negative for any focal numbness, tingling, weakness, slurred speech, photophobia, vision loss. ? ?Patient denies any tobacco use disorder, substance use disorder and she is adopted, therefore does not know if there is any family history of brain aneurysm or bleed. ? ?Home Medications ?Prior to Admission medications   ?Medication Sig Start Date End Date Taking? Authorizing Provider  ?albuterol (VENTOLIN HFA) 108 (90 Base) MCG/ACT inhaler Inhale 2 puffs into the lungs every 6 (six) hours as needed for wheezing. 06/05/21   Tollie EthEarly, Sara E, NP  ?cyclobenzaprine (FLEXERIL) 10 MG tablet Take 1 tablet (10 mg total) by mouth 3 (three) times daily as needed for muscle spasms. 05/06/21   Warden FillersBass, Lawrence A, MD  ?ibuprofen (ADVIL) 600 MG tablet Take 1 tablet (600 mg total) by mouth every 6 (six) hours as needed for mild pain or moderate pain. 02/16/21   Anyanwu, Jethro BastosUgonna A, MD  ?Insulin Pen Needle (PEN NEEDLES) 32G X 4 MM MISC USE ONE NEW NEEDLE PER WEEK WITH INJECTION OF OZEMPIC 06/10/21   Tollie EthEarly, Sara E, NP  ?metFORMIN (GLUCOPHAGE) 500 MG tablet Take 1 tablet (500 mg total) by mouth at bedtime. 09/06/21   Tollie EthEarly, Sara E, NP  ?ondansetron (ZOFRAN-ODT) 8 MG disintegrating tablet Take 1 tablet (8 mg total) by  mouth every 8 (eight) hours as needed for nausea. 09/06/21   Tollie EthEarly, Sara E, NP  ?pantoprazole (PROTONIX) 40 MG tablet Take 1 tablet (40 mg total) by mouth daily. 09/25/21   Tollie EthEarly, Sara E, NP  ?predniSONE (STERAPRED UNI-PAK 21 TAB) 10 MG (21) TBPK tablet 6 day taper; take as directed on package instructions 10/04/21   Margaretann LovelessBurnette, Jennifer M, PA-C  ?Prenatal 27-1 MG TABS Take 1 tablet by mouth daily. 03/07/21   Shumway BingPickens, Charlie, MD  ?promethazine (PHENERGAN) 25 MG tablet Take 1/2 tab (12.5mg ) to 1 tab (25mg ) every 6 hours as needed for vomiting not controlled with zofran. 09/06/21   Tollie EthEarly, Sara E, NP  ?rizatriptan (MAXALT) 10 MG tablet Take 1 tablet (10 mg total) by mouth as needed for migraine. May repeat in 2 hours if needed for ongoing pain. Do not take more than 2 doses in a 24 hour period. 06/05/21   Tollie EthEarly, Sara E, NP  ?   ? ?Allergies    ?Latex and Penicillins   ? ?Review of Systems   ?Review of Systems  ?All other systems reviewed and are negative. ? ?Physical Exam ?Updated Vital Signs ?BP (!) 134/119   Pulse (!) 103   Temp 98.5 ?F (36.9 ?C) (Oral)   Resp 18   SpO2 98%  ?Physical Exam ?Vitals and nursing note reviewed.  ?Constitutional:   ?   Appearance: She is well-developed.  ?HENT:  ?   Head: Atraumatic.  ?  Cardiovascular:  ?   Rate and Rhythm: Normal rate.  ?Pulmonary:  ?   Effort: Pulmonary effort is normal.  ?Musculoskeletal:  ?   Cervical back: Normal range of motion and neck supple.  ?Skin: ?   General: Skin is warm and dry.  ?Neurological:  ?   Mental Status: She is alert and oriented to person, place, and time.  ?   GCS: GCS eye subscore is 4. GCS verbal subscore is 5. GCS motor subscore is 6.  ?   Cranial Nerves: No cranial nerve deficit or dysarthria.  ? ? ?ED Results / Procedures / Treatments   ?Labs ?(all labs ordered are listed, but only abnormal results are displayed) ?Labs Reviewed - No data to display ? ?EKG ?None ? ?Radiology ?CT Head Wo Contrast ? ?Result Date: 10/22/2021 ?CLINICAL DATA:   Headache, chronic, new features or increased frequency EXAM: CT HEAD WITHOUT CONTRAST TECHNIQUE: Contiguous axial images were obtained from the base of the skull through the vertex without intravenous contrast. RADIATION DOSE REDUCTION: This exam was performed according to the departmental dose-optimization program which includes automated exposure control, adjustment of the mA and/or kV according to patient size and/or use of iterative reconstruction technique. COMPARISON:  None. FINDINGS: Brain: No intracranial hemorrhage, mass effect, or midline shift. No hydrocephalus. The basilar cisterns are patent. No evidence of territorial infarct or acute ischemia. No extra-axial or intracranial fluid collection. Vascular: No hyperdense vessel or unexpected calcification. Skull: No fracture or focal lesion. Sinuses/Orbits: There is complete opacification of the left maxillary sinus with thinning of the medial wall. Internal contents are heterogeneous, some of which are complex. Opacification of left-sided ethmoid air cells as well as left frontal sinus. No mastoid effusion. Other: None. IMPRESSION: 1. No acute intracranial abnormality. 2. Left paranasal sinus disease with complete opacification of the left maxillary sinus and thinning of the medial wall. Internal contents are heterogeneous, some of which are complex. This may be due to inspissated secretions, however polyp or fungal infection are also considered. Electronically Signed   By: Narda Rutherford M.D.   On: 10/22/2021 21:35   ? ?Procedures ?Procedures  ? ? ?Medications Ordered in ED ?Medications  ?sodium chloride 0.9 % bolus 500 mL (0 mLs Intravenous Stopped 10/22/21 2252)  ?ketorolac (TORADOL) 15 MG/ML injection 15 mg (15 mg Intravenous Given 10/22/21 2133)  ?dexamethasone (DECADRON) injection 10 mg (10 mg Intravenous Given 10/22/21 2141)  ?prochlorperazine (COMPAZINE) injection 10 mg (10 mg Intravenous Given 10/22/21 2138)  ?diphenhydrAMINE (BENADRYL) injection 25  mg (25 mg Intravenous Given 10/22/21 2135)  ? ? ?ED Course/ Medical Decision Making/ A&P ?  ?                        ?Medical Decision Making ?Amount and/or Complexity of Data Reviewed ?Radiology: ordered. ? ?Risk ?Prescription drug management. ? ? ?36 year old female comes in with chief complaint of headache. ?She has history of preeclampsia, gestational diabetes and might be prediabetic. ? ?It appears that she has been having headaches for at least the last 2 or 3 months.  The headaches are not positional, thunderclap in nature and she does not have any associated neurologic symptoms at all. ? ?On exam, there is no focal neurodeficits, no meningismus.  Her BP was elevated on arrival, but has been less than 140 for at least the last 3 or 4 reads. ? ?Patient is not on any oral contraceptives and has no history of clotting disorder. ?Headaches are frontal, left-sided. ? ?  Given the chronicity of the headache, we will get a CT scan of the brain.  If the CT scan is negative and patient's symptoms have improved, then we will give her outpatient neurology follow-up for further work-up which might include looking for conditions like venous dural thrombosis or idiopathic intracranial hypertension.  ? ?Reassessment: ?Nursing staff informed me that patient feels a lot better after the medications in the ED and wants to go home.  Patient reassessed.  States that her headache is pretty much resolved. ?Given that there is no specific pattern consistent with IIH, and lower initial concerns for venous thrombosis, we will advised patient to follow-up with either her PCP or call neurology for an outpatient follow-up. ? ?Strict ER return precautions have been discussed, and patient is agreeing with the plan and is comfortable with the workup done and the recommendations from the ER. ? ? ?Final Clinical Impression(s) / ED Diagnoses ?Final diagnoses:  ?Bad headache  ? ? ?Rx / DC Orders ?ED Discharge Orders   ? ? None  ? ?  ? ? ?   ?Derwood Kaplan, MD ?10/22/21 2338 ? ?

## 2021-10-22 NOTE — ED Triage Notes (Signed)
Pt presents with a headache started off as a "slight" headache upon waking this am and become progressively worse throughout the day. Pt's BP at was 160/107, 152/106. Pt has a hx of high blood pressure with preeclampsia. Pt reports the pain was so intense she was unable to concentrate at work.  ? ?Pt ambulatory in triage, no neuro deficits noted  ?

## 2021-10-22 NOTE — Discharge Instructions (Addendum)
We saw you in the ER for headaches. All the labs and imaging are normal. ?We are not sure what is causing your headaches, however, there appears to be no evidence of infection, bleeds or tumors based on our exam and results. ? ?Please take motrin round the clock for the next 48 hours, and take other meds prescribed only for break through pain. ?See your doctor if the pain persists, as you might need better medications or a specialist. ? ?Please return to the ER if the headache gets severe and in not improving, you have associated new one sided numbness, tingling, weakness or confusion, seizures, poor balance or poor vision. ? ?

## 2021-12-19 ENCOUNTER — Encounter (HOSPITAL_BASED_OUTPATIENT_CLINIC_OR_DEPARTMENT_OTHER): Payer: Self-pay | Admitting: Nurse Practitioner

## 2021-12-19 ENCOUNTER — Ambulatory Visit (INDEPENDENT_AMBULATORY_CARE_PROVIDER_SITE_OTHER): Payer: Medicaid Other

## 2021-12-19 ENCOUNTER — Ambulatory Visit (INDEPENDENT_AMBULATORY_CARE_PROVIDER_SITE_OTHER): Payer: Medicaid Other | Admitting: Nurse Practitioner

## 2021-12-19 VITALS — BP 128/88 | HR 96 | Ht 67.0 in | Wt 224.0 lb

## 2021-12-19 DIAGNOSIS — M549 Dorsalgia, unspecified: Secondary | ICD-10-CM

## 2021-12-19 DIAGNOSIS — M62830 Muscle spasm of back: Secondary | ICD-10-CM

## 2021-12-19 MED ORDER — PREDNISONE 10 MG (48) PO TBPK: ORAL_TABLET | Freq: Every day | ORAL | 0 refills | Status: DC

## 2021-12-19 NOTE — Patient Instructions (Signed)
I am sending in a prednisone taper for 12 days and I want to get an x-ray to make sure the disc is not slipped or herniated.

## 2021-12-19 NOTE — Progress Notes (Signed)
Tollie Eth, DNP, AGNP-c [facility] [address]  [address] [place], [place] [address] [phone]  Subjective:   Katherine Maldonado is a 36 y.o. female presents to day for back pain.  Back pain She reports that starting about 3 weeks ago she woke up from a nap and was a unable to get up  She tells me that she had to roll off of the bed due to pain and inability to straighten upright She reports since that time the pain has continued She tells me the pain is severe in nature and 10 out of 10 The pain is in the sacral area bilaterally and she reports it radiates up on each side. She tells me that her back frequently "freezes" and during those times she is unable to stand upright She has a 22-month-old child at home she tells me she is unable to lift or pick her up out of the crib She has tried Flexeril which has worked in the past.  She tells me this did not help at all She reports that she had opiate pain medication left over from when she had her daughter and she tried this and it did not help at all She reports the only thing that has helped is smoking marijuana She denies saddle symptoms, urinary symptoms, pelvic pain or pressure, constipation, radiation down the legs, fever, chills  PMH, Medications, and Allergies reviewed and updated in chart.   ROS negative except for what is listed in HPI. Objective:  BP 128/88   Pulse 96   Ht 5\' 7"  (1.702 m)   Wt 224 lb (101.6 kg)   SpO2 96%   BMI 35.08 kg/m  Physical Exam Vitals and nursing note reviewed.  Constitutional:      Appearance: Normal appearance.  HENT:     Head: Normocephalic.  Eyes:     Extraocular Movements: Extraocular movements intact.     Pupils: Pupils are equal, round, and reactive to light.  Musculoskeletal:        General: Tenderness present.     Cervical back: Normal and normal range of motion. No rigidity or tenderness.     Thoracic back: Tenderness present. No  bony tenderness.     Lumbar back: Tenderness present. Decreased range of motion. Negative right straight leg raise test and negative left straight leg raise test.       Back:     Right lower leg: No edema.     Left lower leg: No edema.     Comments: Pain on palpation of the sacral and lumbar area bilaterally.  Pain radiates up into the low thoracic region bilaterally without inclusion of the spine.  No erythema, edema, ecchymosis, warmth present.  Pain incited with lateral bending, lateral twisting, and flexion and extension of the lumbar spine.  Skin:    General: Skin is warm and dry.     Capillary Refill: Capillary refill takes less than 2 seconds.  Neurological:     General: No focal deficit present.     Mental Status: She is alert and oriented to person, place, and time.     Sensory: No sensory deficit.     Motor: No weakness.     Coordination: Coordination normal.     Gait: Gait normal.     Deep Tendon Reflexes: Reflexes normal.  Psychiatric:        Mood and Affect: Mood normal.        Behavior: Behavior normal.  Thought Content: Thought content normal.        Judgment: Judgment normal.           Assessment & Plan:   Problem List Items Addressed This Visit     Spinal column pain    Back pain x3 weeks of unknown etiology.  Patient does have limitations to range of motion laterally and with flexion and extension of the lower spine.  There is also tenderness to palpation with no visual changes apparent on the skin.  Suspect possible misalignment versus muscular injury.  We will send patient for spinal x-ray and start on steroid burst to see if this is helpful for management of symptoms.  Recommend gentle stretching exercises, ice, and heat to the lower spine to help with pain.  Encourage patient not to lift and bend if at all possible.  We will make changes to plan of care based on findings of imaging.      Relevant Medications   predniSONE (STERAPRED UNI-PAK 48 TAB) 10  MG (48) TBPK tablet   Other Relevant Orders   DG Lumbar Spine 2-3 Views (Completed)   Back spasm - Primary   Relevant Medications   predniSONE (STERAPRED UNI-PAK 48 TAB) 10 MG (48) TBPK tablet   Other Relevant Orders   DG Lumbar Spine 2-3 Views (Completed)     Tollie Eth, DNP, AGNP-c 12/20/2021  4:24 PM

## 2021-12-20 NOTE — Assessment & Plan Note (Signed)
Back pain x3 weeks of unknown etiology.  Patient does have limitations to range of motion laterally and with flexion and extension of the lower spine.  There is also tenderness to palpation with no visual changes apparent on the skin.  Suspect possible misalignment versus muscular injury.  We will send patient for spinal x-ray and start on steroid burst to see if this is helpful for management of symptoms.  Recommend gentle stretching exercises, ice, and heat to the lower spine to help with pain.  Encourage patient not to lift and bend if at all possible.  We will make changes to plan of care based on findings of imaging.

## 2021-12-24 ENCOUNTER — Ambulatory Visit (HOSPITAL_BASED_OUTPATIENT_CLINIC_OR_DEPARTMENT_OTHER): Payer: Medicaid Other | Admitting: Nurse Practitioner

## 2022-01-15 ENCOUNTER — Encounter (HOSPITAL_BASED_OUTPATIENT_CLINIC_OR_DEPARTMENT_OTHER): Payer: Self-pay | Admitting: Nurse Practitioner

## 2022-01-17 IMAGING — US US MFM OB DETAIL+14 WK
1 series · 13 of 28 positions shown · non-contrast
Comparison: none

[Series 1: us mfm ob detail+14 wk · 13 of 112 slices shown]
[im 5/112]
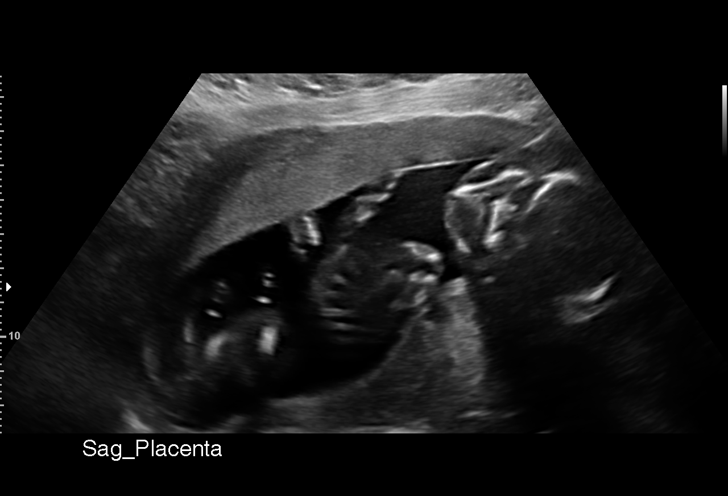
[im 13/112]
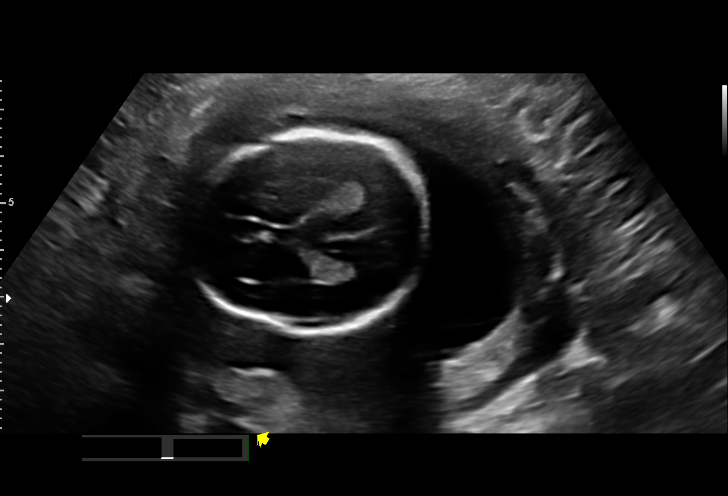
[im 21/112]
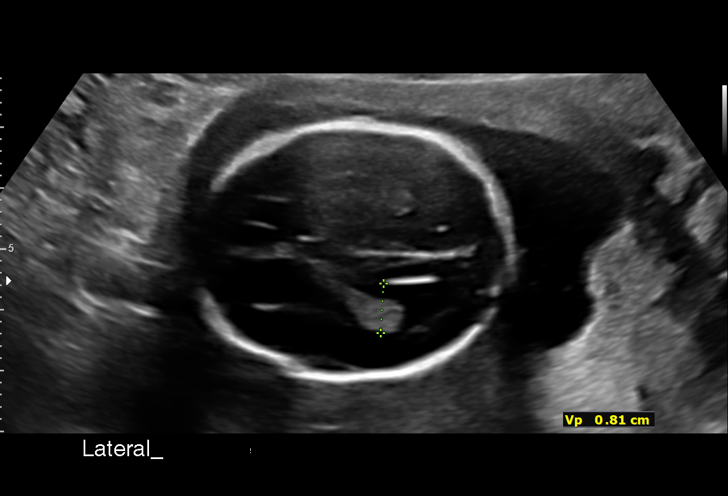
[im 29/112]
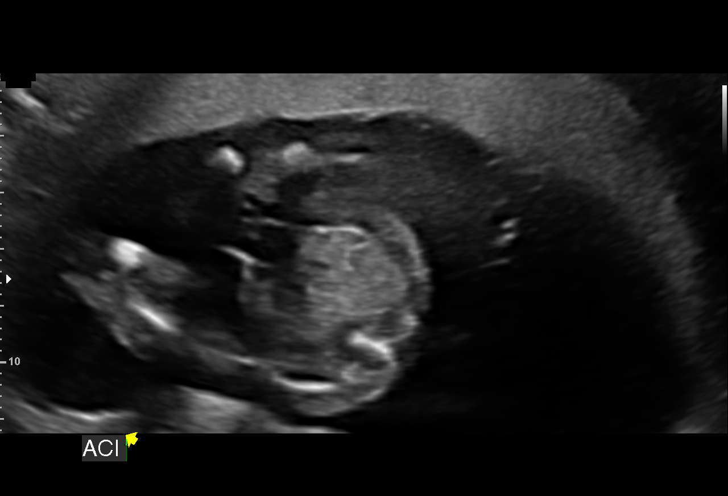
[im 38/112]
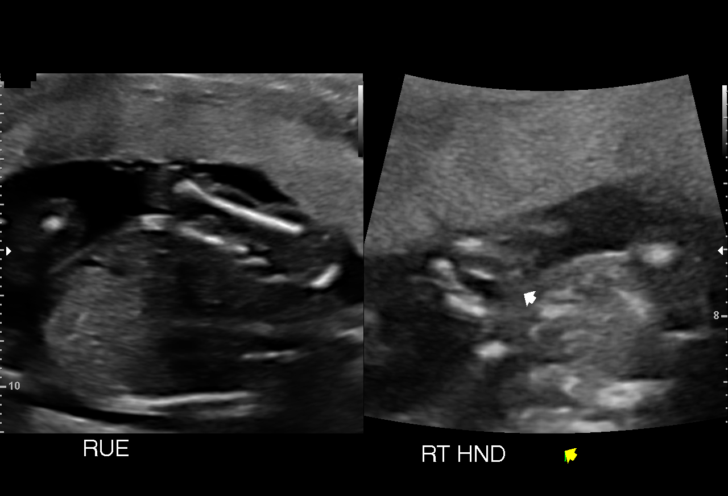
[im 46/112]
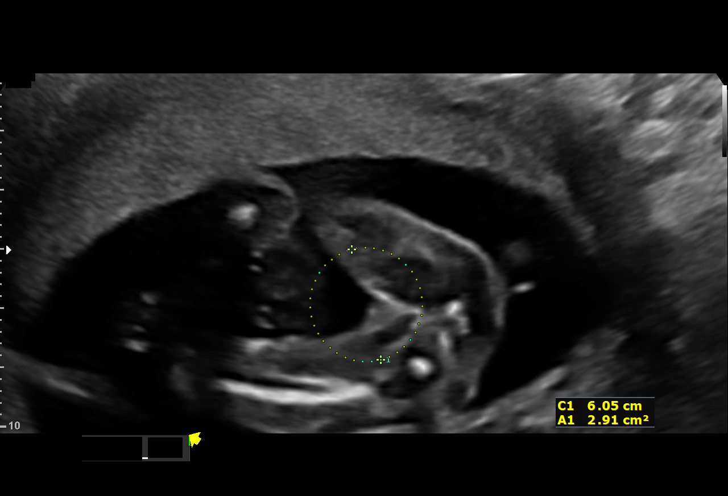
[im 58/112]
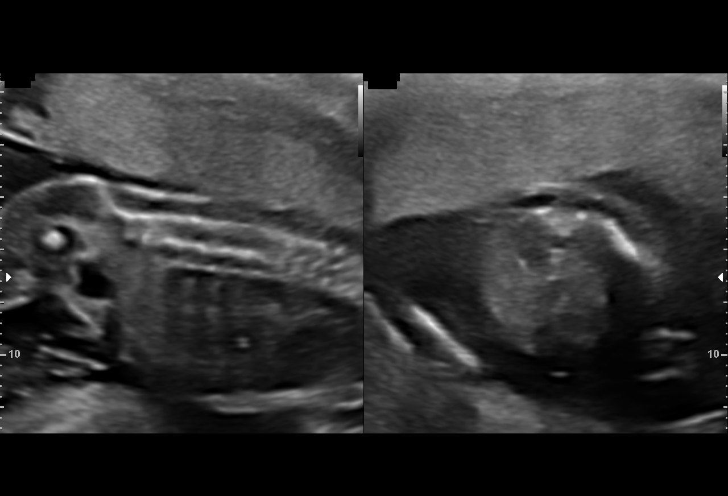
[im 66/112]
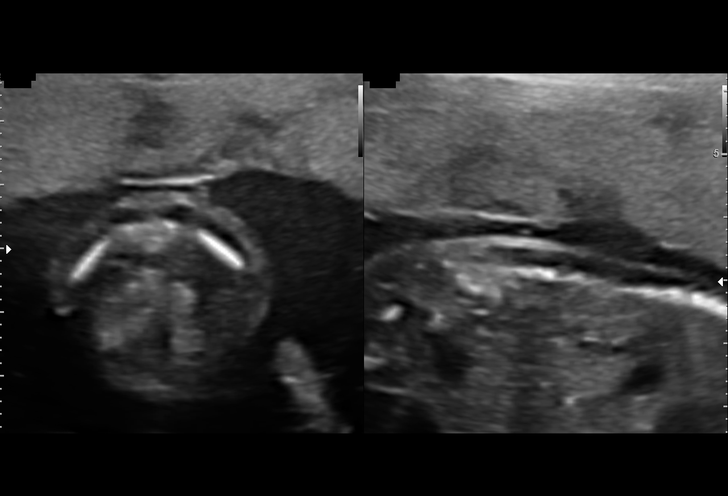
[im 75/112]
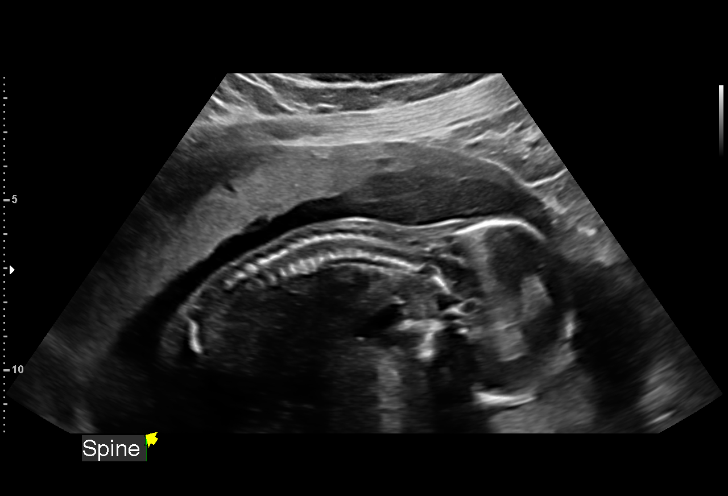
[im 83/112]
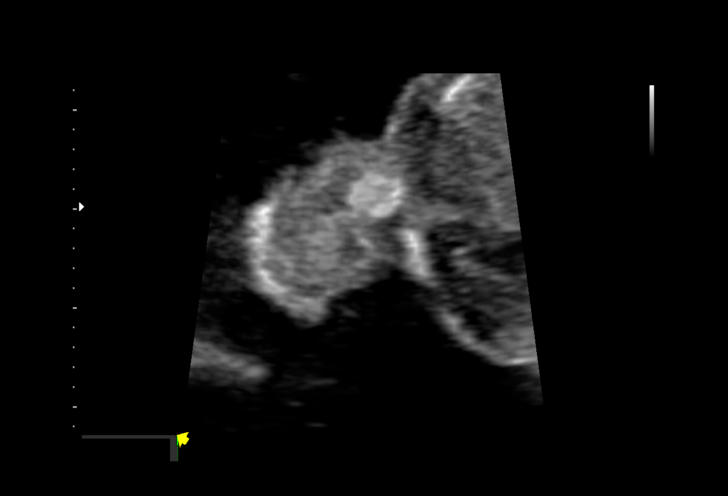
[im 91/112]
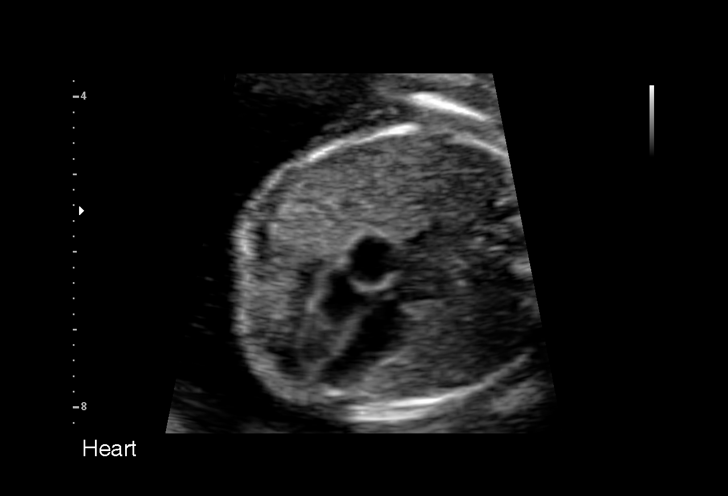
[im 99/112]
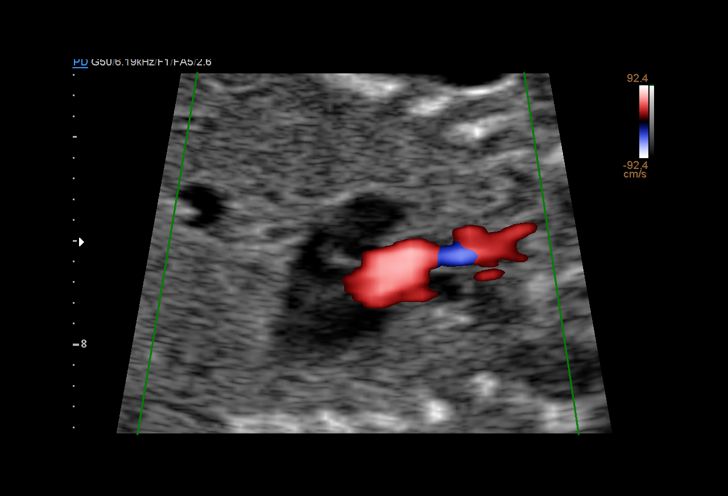
[im 107/112]
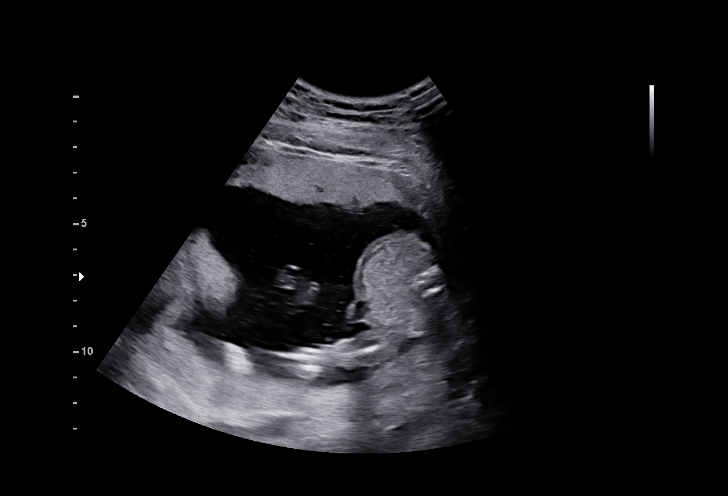

[13 of 28 positions shown; findings below may reference images not displayed]

Indications

 Advanced maternal age primigravida 35+,
 second trimester
 19 weeks gestation of pregnancy
 Encounter for antenatal screening for
 malformations (LR NIPS)
 Cystic Fibrosis (CF) Carrier, second trimester
 Medical complication of pregnancy (Anxiety)
Fetal Evaluation

 Num Of Fetuses:         1
 Fetal Heart Rate(bpm):  144
 Cardiac Activity:       Observed
 Presentation:           Cephalic
 Placenta:               Anterior
 P. Cord Insertion:      Visualized, central

 Amniotic Fluid
 AFI FV:      Within normal limits

                             Largest Pocket(cm)

Biometry

 BPD:      41.9  mm     G. Age:  18w 5d         37  %    CI:        75.68   %    70 - 86
                                                         FL/HC:      18.9   %    16.1 -
 HC:      152.7  mm     G. Age:  18w 2d         13  %    HC/AC:      1.19        1.09 -
 AC:      128.4  mm     G. Age:  18w 3d         26  %    FL/BPD:     68.7   %
 FL:       28.8  mm     G. Age:  18w 6d         37  %    FL/AC:      22.4   %    20 - 24
 HUM:      28.6  mm     G. Age:  19w 2d         57  %
 CER:      19.8  mm     G. Age:  19w 1d         43  %
 NFT:       2.1  mm
 LV:        8.1  mm
 CM:        5.2  mm

 Est. FW:     247  gm      0 lb 9 oz     23  %
OB History

 Gravidity:    1
 Living:       0
Gestational Age

 LMP:           18w 2d        Date:  06/04/20                 EDD:   03/11/21
 U/S Today:     18w 4d                                        EDD:   03/09/21
 Best:          19w 0d     Det. By:  Early Ultrasound         EDD:   03/06/21
                                     (07/14/20)
Anatomy

 Cranium:               Appears normal         LVOT:                   Appears normal
 Cavum:                 Appears normal         Aortic Arch:            Appears normal
 Ventricles:            Appears normal         Ductal Arch:            Not well visualized
 Choroid Plexus:        Appears normal         Diaphragm:              Appears normal
 Cerebellum:            Appears normal         Stomach:                Appears normal, left
                                                                       sided
 Posterior Fossa:       Appears normal         Abdomen:                Appears normal
 Nuchal Fold:           Appears normal         Abdominal Wall:         Appears nml (cord
                                                                       insert, abd wall)
 Face:                  Orbits nl; profile not Cord Vessels:           Appears normal (3
                        well visualized                                vessel cord)
 Lips:                  Appears normal         Kidneys:                Appear normal
 Palate:                Not well visualized    Bladder:                Appears normal
 Thoracic:              Appears normal         Spine:                  Appears normal
 Heart:                 Not well visualized    Upper Extremities:      Appears normal
 RVOT:                  Not well visualized    Lower Extremities:      Appears normal

 Other:  Heels/feet and 5th digits visualized. Lenses visualized. Fetus appears
         to be female. Technically difficult due to maternal habitus and fetal
         position. Open hands visualized.
Cervix Uterus Adnexa

 Cervix
 Length:           4.28  cm.
 Normal appearance by transabdominal scan.

 Uterus
 No abnormality visualized.

 Right Ovary
 Not visualized.

 Left Ovary
 Not visualized.

 Cul De Sac
 No free fluid seen.
 Adnexa
 No adnexal mass visualized.
Comments

 This patient was seen for a detailed fetal anatomy scan due
 to advanced maternal age.  She has screened positive as a
 carrier for cystic fibrosis.  The father of the baby has not been
 tested to determine if he is also a carrier for cystic fibrosis.
 She denies any significant past medical history and denies
 any problems in her current pregnancy.
 She had a cell free DNA test earlier in her pregnancy which
 indicated a low risk for trisomy 21, 18, and 13. A female fetus
 is predicted.
 She was informed that the fetal growth and amniotic fluid
 level were appropriate for her gestational age.
 The views of the fetal anatomy were limited today due to the
 fetal position.
 The patient was informed that anomalies may be missed due
 to technical limitations. If the fetus is in a suboptimal position
 or maternal habitus is increased, visualization of the fetus in
 the maternal uterus may be impaired.
 Although the patient reports that the father of the baby has
 had two prior children who were not affected with cystic
 fibrosis, she was advised that the only way to determine if he
 is a carrier for cystic fibrosis is for him to have a blood test.  It
 is uncertain that he will be able to get a blood test drawn.
 She was offered and declined an amniocentesis today for
 definitive diagnosis of cystic fibrosis and fetal aneuploidy.
 A follow-up exam was scheduled in 4 weeks to complete the
 views of the fetal anatomy.

## 2022-01-24 ENCOUNTER — Encounter (HOSPITAL_BASED_OUTPATIENT_CLINIC_OR_DEPARTMENT_OTHER): Payer: Self-pay | Admitting: Emergency Medicine

## 2022-01-24 ENCOUNTER — Other Ambulatory Visit: Payer: Self-pay

## 2022-01-24 ENCOUNTER — Emergency Department (HOSPITAL_BASED_OUTPATIENT_CLINIC_OR_DEPARTMENT_OTHER)
Admission: EM | Admit: 2022-01-24 | Discharge: 2022-01-24 | Disposition: A | Discharge status: Home / Self Care | Payer: Medicaid Other | Attending: Emergency Medicine | Admitting: Emergency Medicine

## 2022-01-24 DIAGNOSIS — M545 Low back pain, unspecified: Secondary | ICD-10-CM | POA: Diagnosis present

## 2022-01-24 DIAGNOSIS — Z794 Long term (current) use of insulin: Secondary | ICD-10-CM | POA: Diagnosis not present

## 2022-01-24 MED ORDER — CYCLOBENZAPRINE HCL 10 MG PO TABS: 10.0000 mg | ORAL_TABLET | Freq: Three times a day (TID) | ORAL | 0 refills | Status: AC | PRN

## 2022-01-24 NOTE — ED Provider Notes (Signed)
[facility] EMERGENCY DEPT Provider Note   CSN: [id number] Arrival date & time: 01/24/22  1808     History  Chief Complaint  Patient presents with   Back Pain    Katherine Maldonado is a 36 y.o. female.  Patient with a complaint of back pain for about 6 weeks bilateral lumbar back.  No radiation of pain into the legs no numbness or weakness to the feet.  Patient was seen by primary care doctor for this on June 8.  Had x-rays of the back without any bony abnormalities.  Patient is also was on a steroid taper.  Patient states it has not helped.  Matter fact thinks the backs getting worse.  Having more trouble with bending over.  No fall or injury.  Past medical history is significant for history of migraines gestational diabetes.       Home Medications Prior to Admission medications   Medication Sig Start Date End Date Taking? Authorizing Provider  cyclobenzaprine (FLEXERIL) 10 MG tablet Take 1 tablet (10 mg total) by mouth 3 (three) times daily as needed for muscle spasms. 01/24/22  Yes Vanetta Mulders, MD  albuterol (VENTOLIN HFA) 108 (90 Base) MCG/ACT inhaler Inhale 2 puffs into the lungs every 6 (six) hours as needed for wheezing. 06/05/21   Tollie Eth, NP  cyclobenzaprine (FLEXERIL) 10 MG tablet Take 1 tablet (10 mg total) by mouth 3 (three) times daily as needed for muscle spasms. 05/06/21   Warden Fillers, MD  ibuprofen (ADVIL) 600 MG tablet Take 1 tablet (600 mg total) by mouth every 6 (six) hours as needed for mild pain or moderate pain. 02/16/21   Anyanwu, Jethro Bastos, MD  Insulin Pen Needle (PEN NEEDLES) 32G X 4 MM MISC USE ONE NEW NEEDLE PER WEEK WITH INJECTION OF OZEMPIC 06/10/21   Tollie Eth, NP  metFORMIN (GLUCOPHAGE) 500 MG tablet Take 1 tablet (500 mg total) by mouth at bedtime. 09/06/21   Tollie Eth, NP  ondansetron (ZOFRAN-ODT) 8 MG disintegrating tablet Take 1 tablet (8 mg total) by mouth every 8 (eight) hours as needed for nausea. 09/06/21    Tollie Eth, NP  pantoprazole (PROTONIX) 40 MG tablet Take 1 tablet (40 mg total) by mouth daily. 09/25/21   Tollie Eth, NP  predniSONE (STERAPRED UNI-PAK 48 TAB) 10 MG (48) TBPK tablet Take by mouth daily. 12-Day taper, po 12/19/21   Early, Sung Amabile, NP  Prenatal 27-1 MG TABS Take 1 tablet by mouth daily. 03/07/21   Lawson Bing, MD  promethazine (PHENERGAN) 25 MG tablet Take 1/2 tab (12.5mg ) to 1 tab (25mg ) every 6 hours as needed for vomiting not controlled with zofran. 09/06/21   09/08/21, NP  rizatriptan (MAXALT) 10 MG tablet Take 1 tablet (10 mg total) by mouth as needed for migraine. May repeat in 2 hours if needed for ongoing pain. Do not take more than 2 doses in a 24 hour period. 06/05/21   06/07/21, NP      Allergies    Latex and Penicillins    Review of Systems   Review of Systems  Constitutional:  Negative for chills and fever.  HENT:  Negative for ear pain and sore throat.   Eyes:  Negative for pain and visual disturbance.  Respiratory:  Negative for cough and shortness of breath.   Cardiovascular:  Negative for chest pain and palpitations.  Gastrointestinal:  Negative for abdominal pain and vomiting.  Genitourinary:  Negative for  dysuria and hematuria.  Musculoskeletal:  Positive for back pain. Negative for arthralgias.  Skin:  Negative for color change and rash.  Neurological:  Negative for seizures, syncope, weakness and numbness.  All other systems reviewed and are negative.   Physical Exam Updated Vital Signs BP 124/89   Pulse 68   Temp 97.9 F (36.6 C) (Oral)   Resp 18   SpO2 99%  Physical Exam Vitals and nursing note reviewed.  Constitutional:      General: She is not in acute distress.    Appearance: Normal appearance. She is well-developed.  HENT:     Head: Normocephalic and atraumatic.  Eyes:     Extraocular Movements: Extraocular movements intact.     Conjunctiva/sclera: Conjunctivae normal.     Pupils: Pupils are equal, round, and  reactive to light.  Cardiovascular:     Rate and Rhythm: Normal rate and regular rhythm.     Heart sounds: No murmur heard. Pulmonary:     Effort: Pulmonary effort is normal. No respiratory distress.     Breath sounds: Normal breath sounds.  Abdominal:     Palpations: Abdomen is soft.     Tenderness: There is no abdominal tenderness.  Musculoskeletal:        General: Tenderness present. No swelling.     Cervical back: Normal range of motion and neck supple.     Comments: Mild tenderness palpation bilateral paraspinous area.  Lateral lower extremities with good strength.  No weakness no numbness  Skin:    General: Skin is warm and dry.     Capillary Refill: Capillary refill takes less than 2 seconds.  Neurological:     General: No focal deficit present.     Mental Status: She is alert and oriented to person, place, and time.     Cranial Nerves: No cranial nerve deficit.     Sensory: No sensory deficit.     Motor: No weakness.  Psychiatric:        Mood and Affect: Mood normal.     ED Results / Procedures / Treatments   Labs (all labs ordered are listed, but only abnormal results are displayed) Labs Reviewed - No data to display  EKG None  Radiology No results found.  Procedures Procedures    Medications Ordered in ED Medications - No data to display  ED Course/ Medical Decision Making/ A&P                           Medical Decision Making Risk Prescription drug management.   Patient most likely needs MRI since things been ongoing for 6 weeks.  Regular x-ray without any acute findings.  CT scan without anything significant here today.  We will have her follow-up with sports medicine.  We will treat her symptomatically.  Patient not showing any evidence of sciatica.  Patient is headed back to [place] on Thursday may need to follow-up there.   Final Clinical Impression(s) / ED Diagnoses Final diagnoses:  Lumbar back pain    Rx / DC Orders ED Discharge Orders           Ordered    cyclobenzaprine (FLEXERIL) 10 MG tablet  3 times daily PRN        01/24/22 1910              Vanetta Mulders, MD 01/24/22 1922

## 2022-01-24 NOTE — ED Notes (Signed)
Discharge instructions, follow up care, and prescription\ reviewed and explained, pt verbalized understanding. Pt caox4 and ambulatory on departure.  

## 2022-01-24 NOTE — Discharge Instructions (Addendum)
Would recommend taking over-the-counter Aleve 2 tablets every 12 hours, the bottle will say 1 tablet every 12 hours but to is fine.  Rest is much as possible understand that is difficult.  Follow-up with sports medicine information provided above.  Take the Flexeril as needed for the muscle spasms and it is also very helpful at night because it makes you sleepy.  Discussed neck step would be an MRI of the back.

## 2022-01-24 NOTE — ED Triage Notes (Signed)
Lower Back pain longer 6 weeks ago, was seen and given prednisone taper. Helped some. Now pain having increased, life limiting back pains worse today.

## 2022-02-14 ENCOUNTER — Ambulatory Visit (HOSPITAL_BASED_OUTPATIENT_CLINIC_OR_DEPARTMENT_OTHER): Payer: Medicaid Other | Admitting: Nurse Practitioner

## 2022-05-04 IMAGING — US US FETAL BPP W/ NON-STRESS
1 series · 13 of 14 positions shown · non-contrast
Comparison: none

[Series 1: us fetal bpp w/ non-stress · 14 acquisitions, 13 frames shown]
[im 1/14]
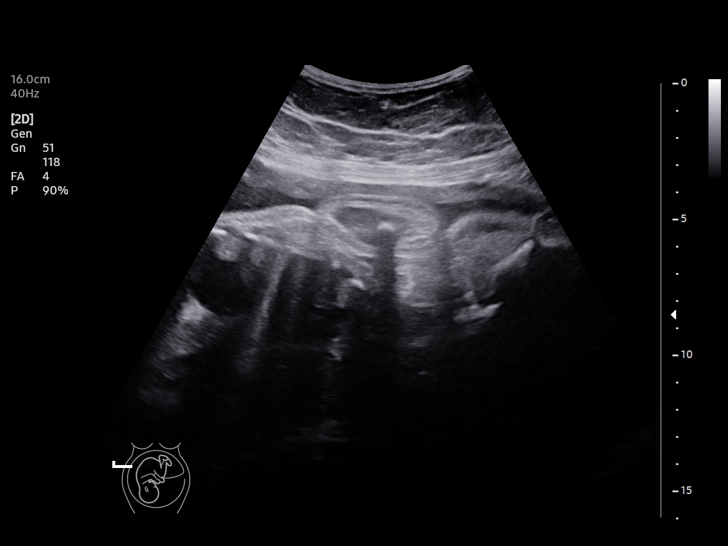
[im 2/14]
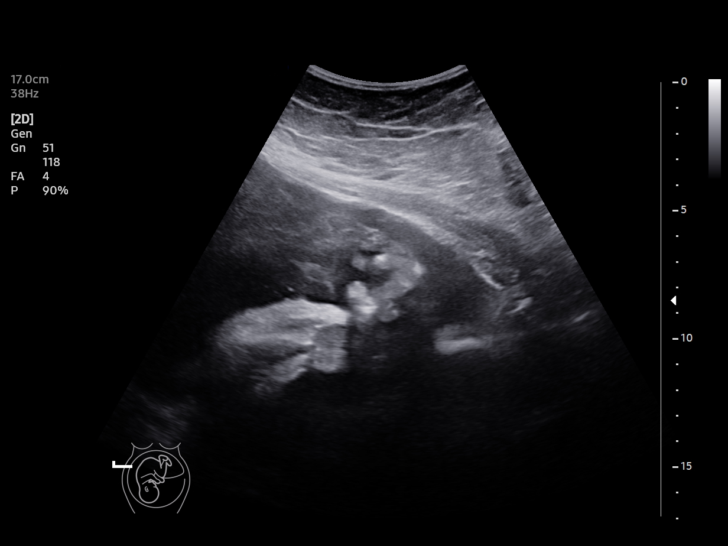
[im 3/14]
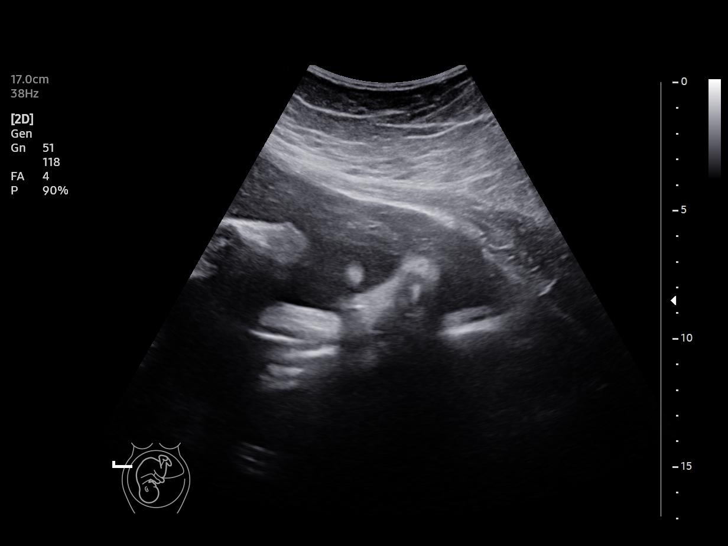
[im 4/14]
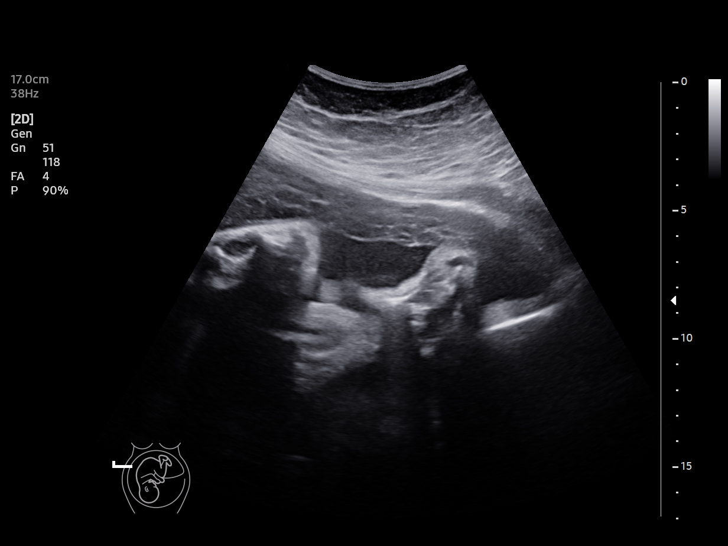
[im 5/14]
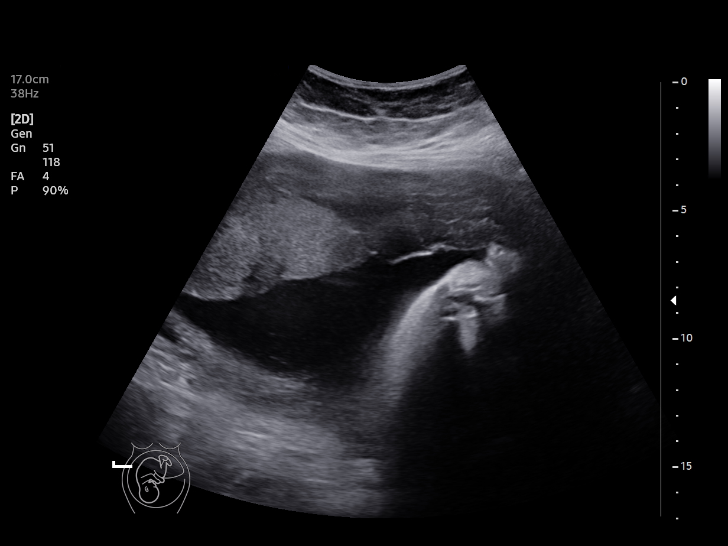
[im 6/14]
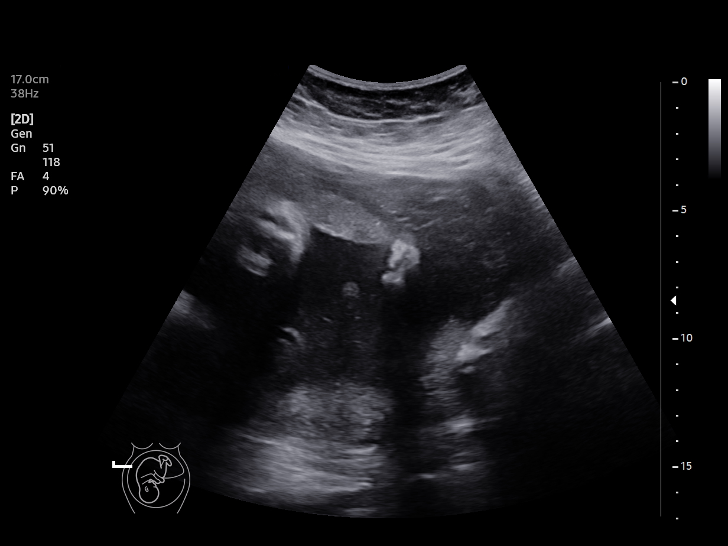
[im 8/14]
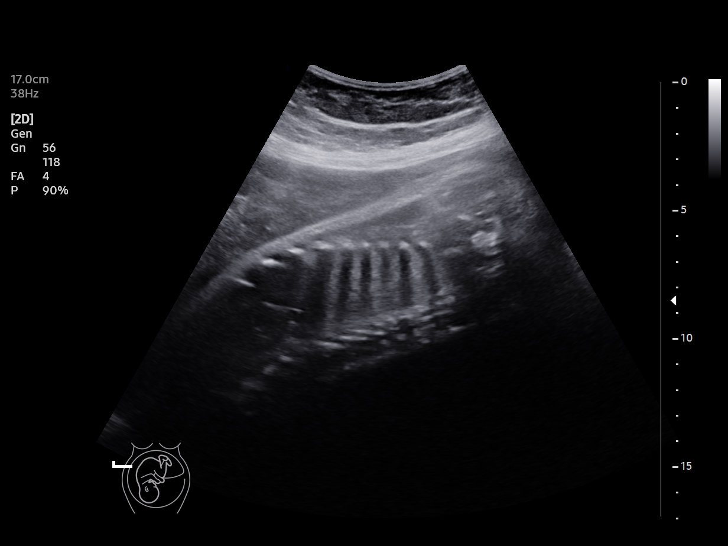
[im 9/14]
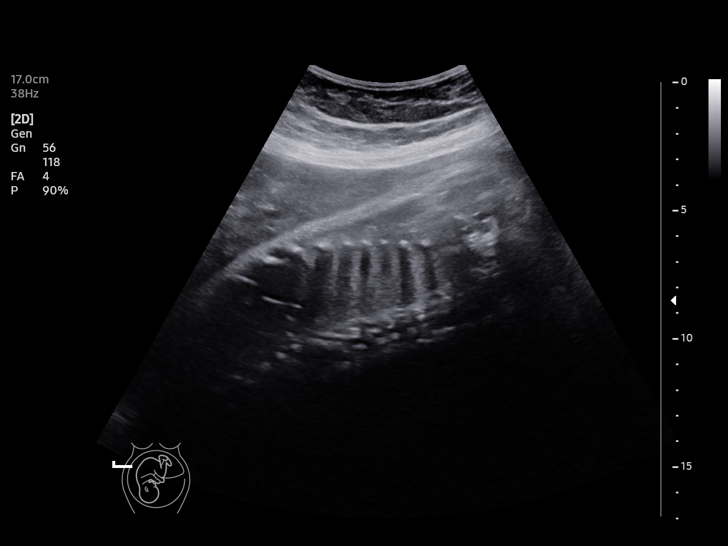
[im 10/14]
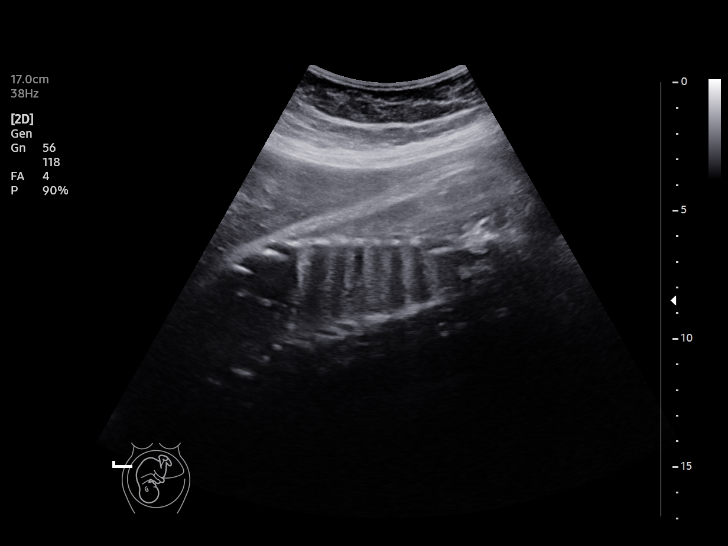
[im 11/14]
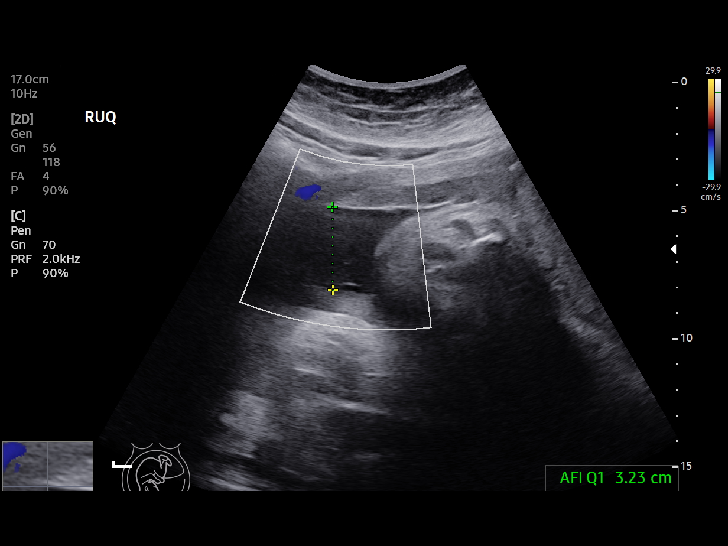
[im 12/14]
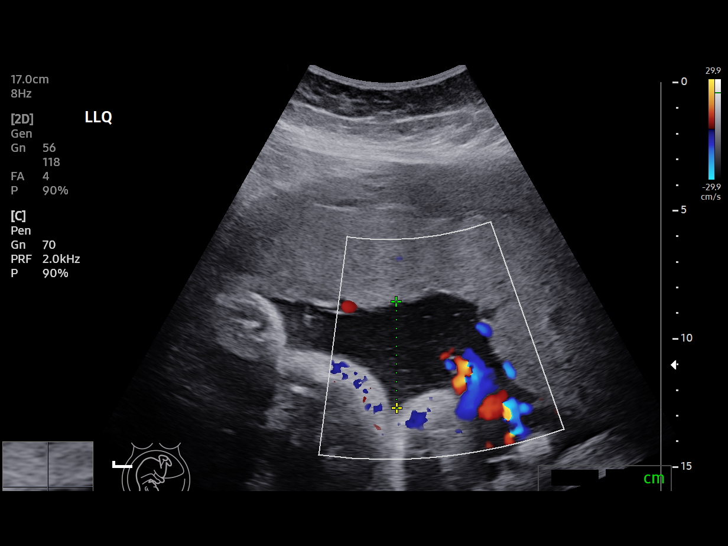
[im 13/14]
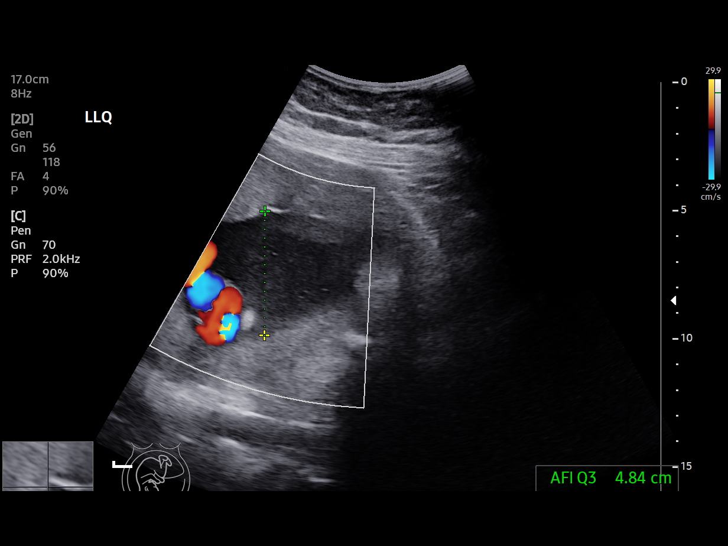
[im 14/14]
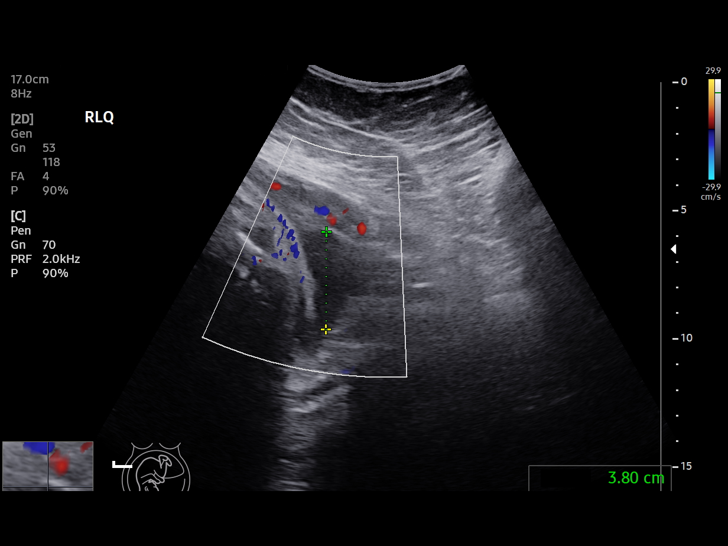

[13 of 14 positions shown; findings below may reference images not displayed]

[REDACTED]care at

 1  US FETAL BPP W/NONSTRESS              76818.4     KATII RODRIGUEZ GAUNA

Service(s) Provided

Indications

 34 weeks gestation of pregnancy
 Gestational diabetes in pregnancy, insulin
 controlled
Fetal Evaluation

 Num Of Fetuses:          1
 Preg. Location:          Intrauterine
 Cardiac Activity:        Observed
 Presentation:            Cephalic

 Amniotic Fluid
 AFI FV:      Within normal limits

 AFI Sum(cm)     %Tile       Largest Pocket(cm)
 16.02           58

 RUQ(cm)       RLQ(cm)       LUQ(cm)        LLQ(cm)

Biophysical Evaluation

 Amniotic F.V:   Pocket => 2 cm             F. Tone:         Observed
 F. Movement:    Observed                   N.S.T:           Reactive
 F. Breathing:   Observed                   Score:           [DATE]
OB History
 Gravidity:    1
 Living:       0
Gestational Age

 LMP:           33w 4d        Date:  06/04/20                 EDD:   03/11/21
 Best:          34w 2d     Det. By:  Early Ultrasound         EDD:   03/06/21
                                     (07/14/20)
Impression

 Antenatal testing is reassuring with BPP [DATE].  Normal
 amniotic fluid volume.
Recommendations

 Continue weekly antenatal testing till delivery .
              Schubert, Daiana

## 2022-05-08 IMAGING — US US FETAL BPP W/ NON-STRESS
1 series · 13 of 13 positions shown · non-contrast
Comparison: none

[Series 1: us fetal bpp w/ non-stress · 13 acquisitions, 13 frames shown]
[im 1/13]
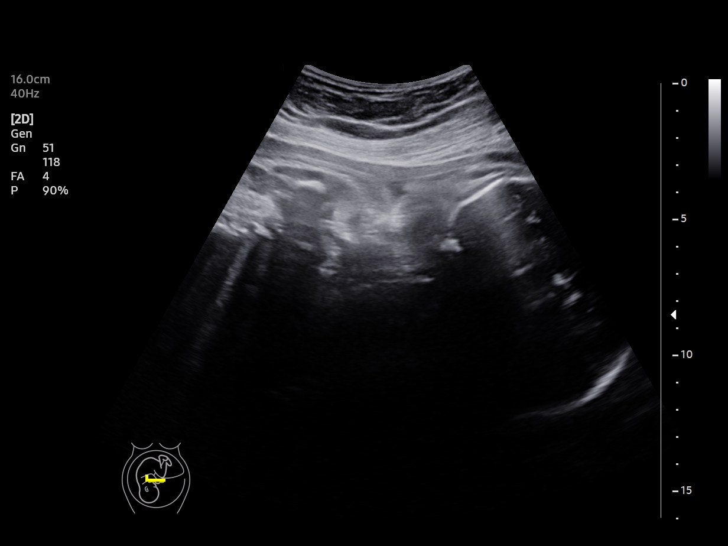
[im 2/13]
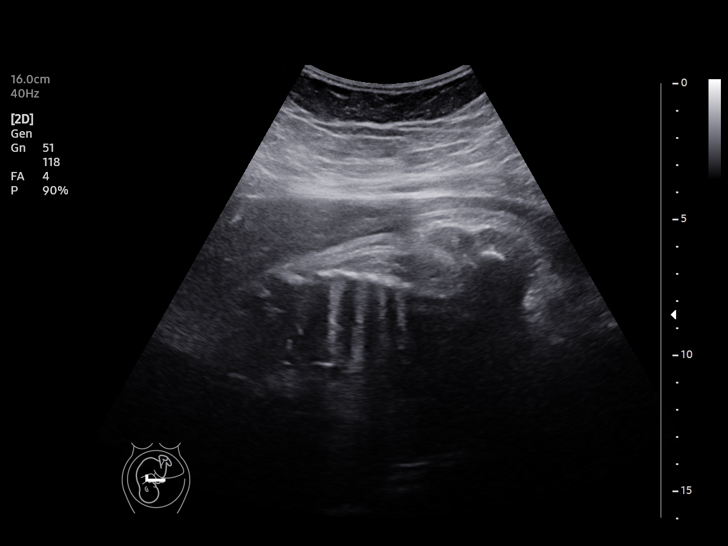
[im 3/13]
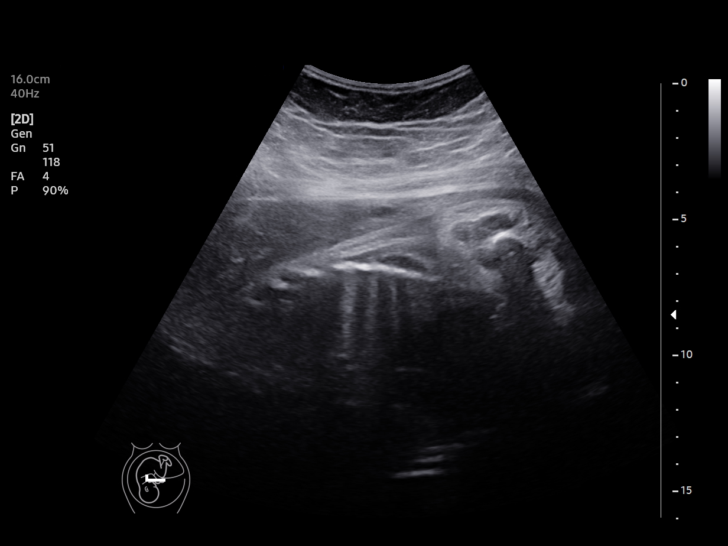
[im 4/13]
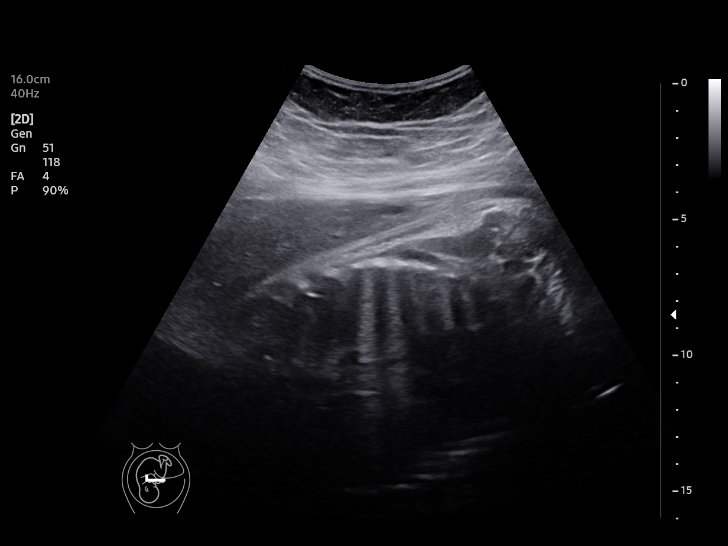
[im 5/13]
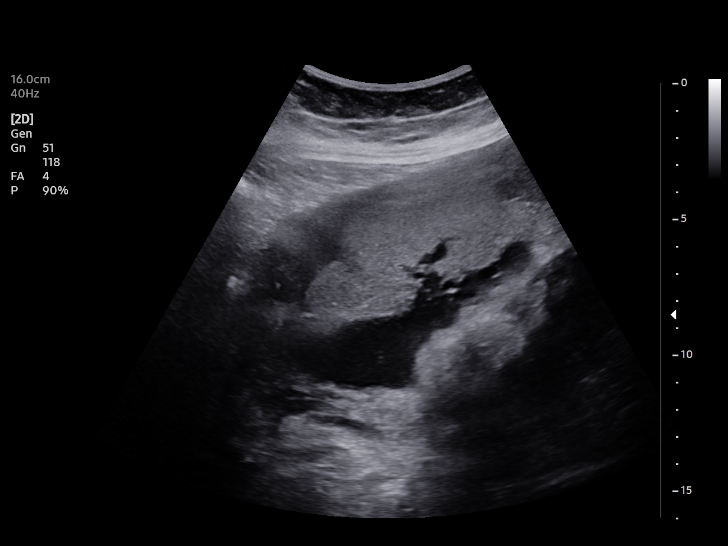
[im 6/13]
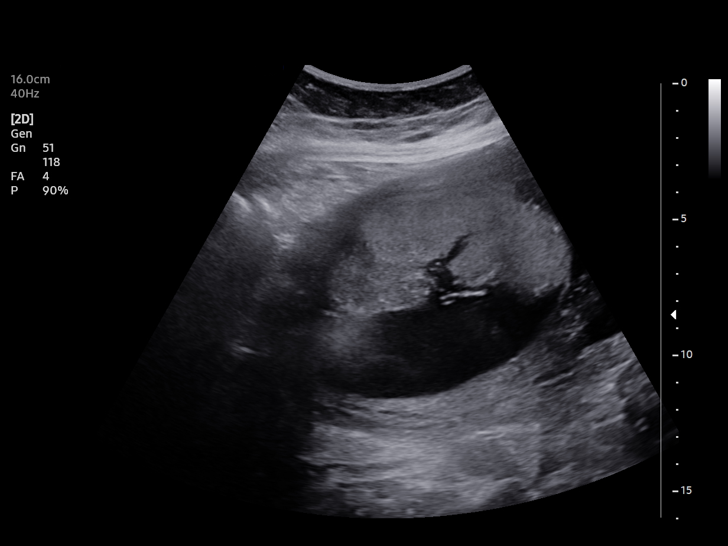
[im 7/13]
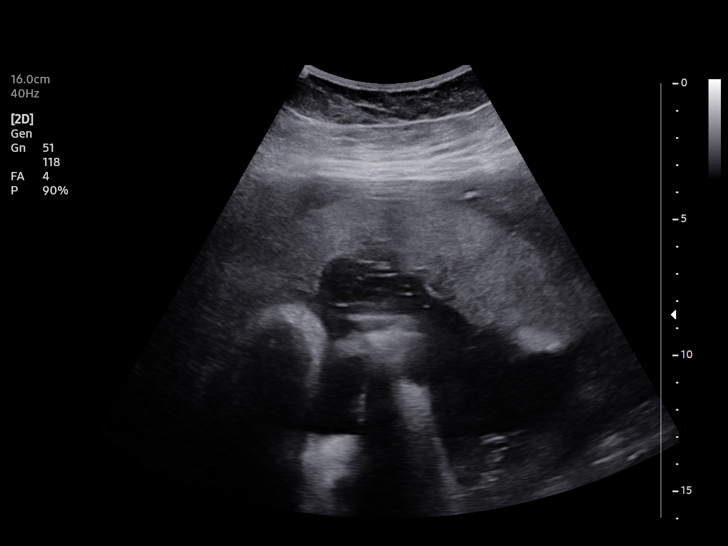
[im 8/13]
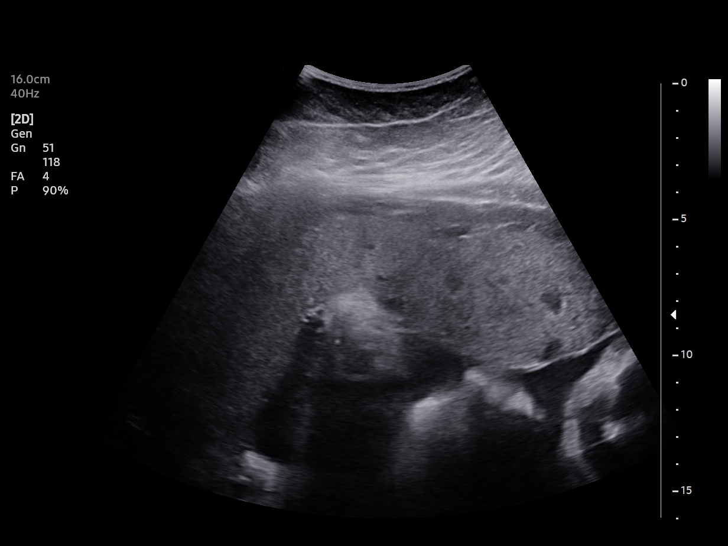
[im 9/13]
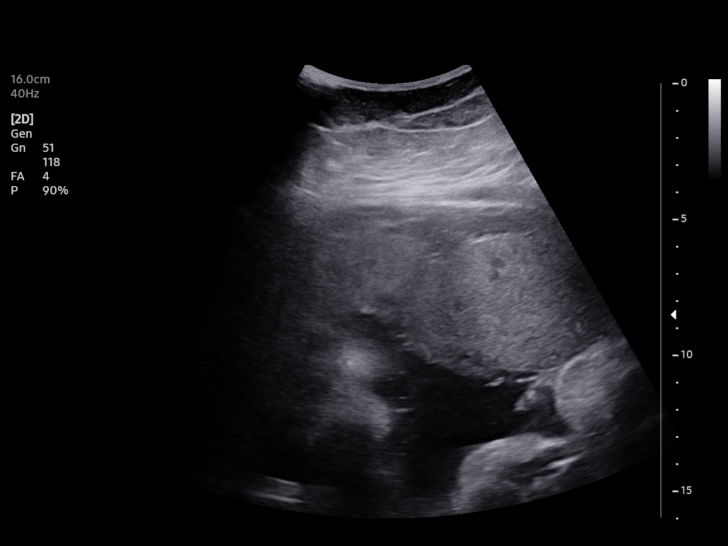
[im 10/13]
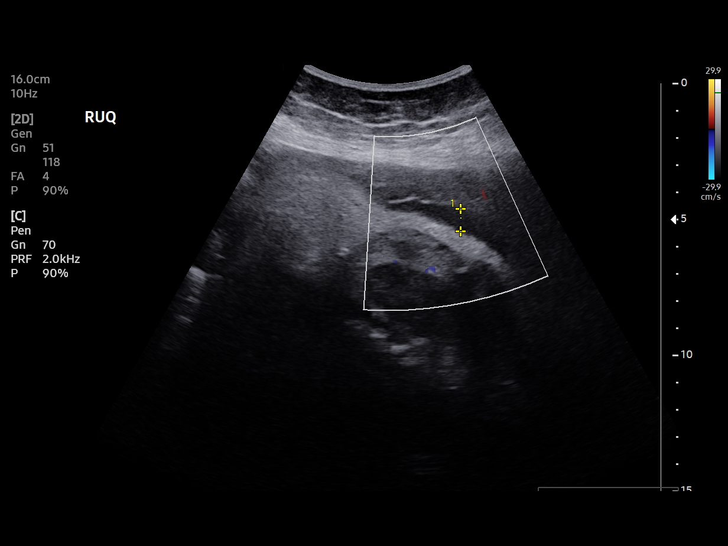
[im 11/13]
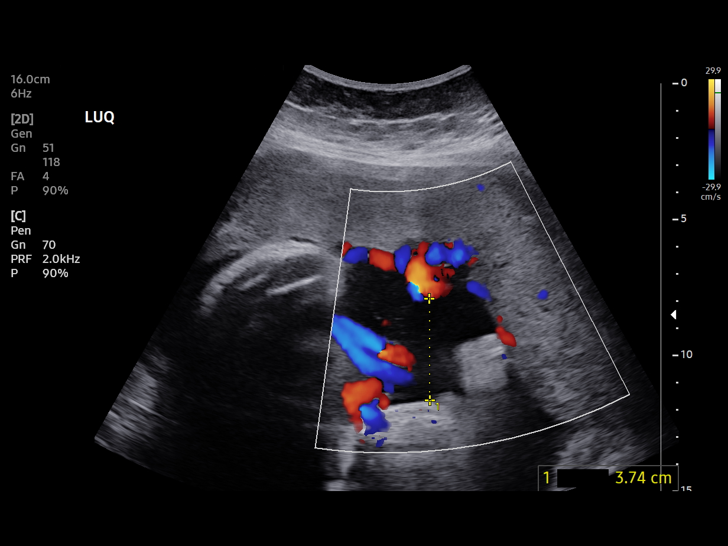
[im 12/13]
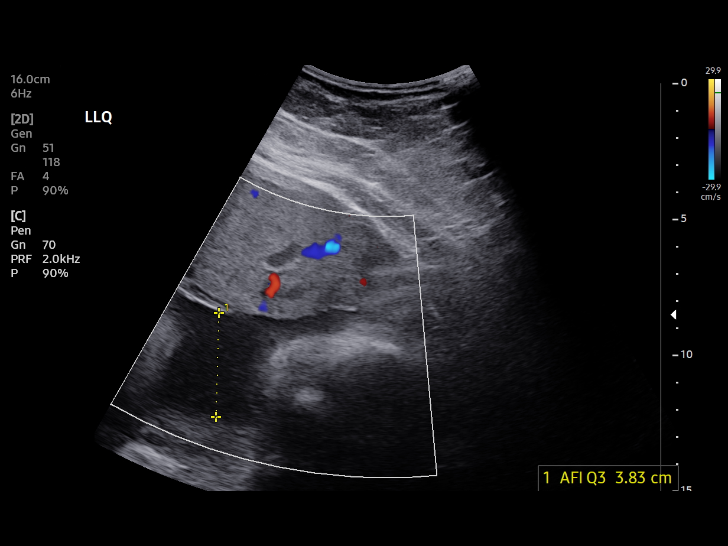
[im 13/13]
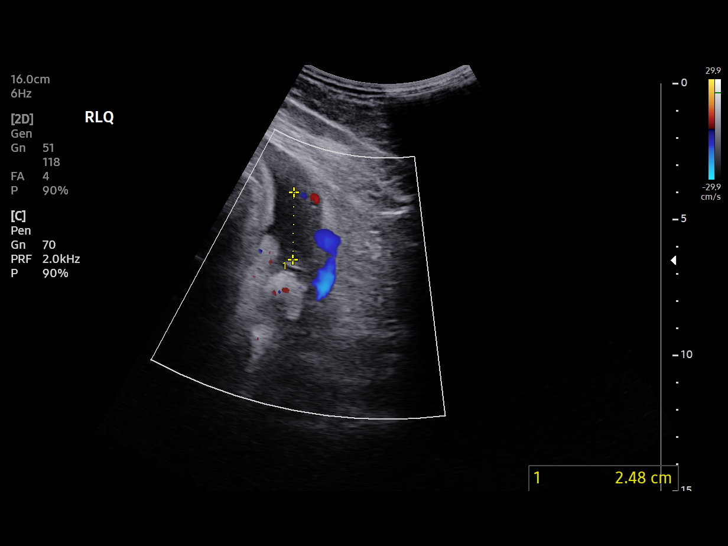

[13 of 13 positions shown; findings below may reference images not displayed]

Healthcare at

 1   US FETAL BPP W/NONSTRESS             76818.4      ABIMELK TIGER

Indications

 34 weeks gestation of pregnancy
 Gestational diabetes in pregnancy, insulin
 controlled
Fetal Evaluation

 Num Of Fetuses:          1
 Preg. Location:          Intrauterine
 Cardiac Activity:        Observed
 Fetal Lie:               Maternal right side
 Presentation:            Cephalic

 Amniotic Fluid
 AFI FV:      Within normal limits

 AFI Sum(cm)     %Tile       Largest Pocket(cm)
 10.88           27

 RUQ(cm)       RLQ(cm)        LUQ(cm)        LLQ(cm)

Biophysical Evaluation

 Amniotic F.V:   Pocket => 2 cm              F. Tone:         Observed
 F. Movement:    Observed                    N.S.T:           Reactive
 F. Breathing:   Observed                    Score:           [DATE]
OB History
 Gravidity:     1
 Living:        0
Gestational Age

 LMP:            34w 1d       Date:  06/04/20                   EDD:  03/11/21
 Best:           34w 6d    Det. By:  Early Ultrasound           EDD:  03/06/21
                                     (07/14/20)
Impression

 Antenatal testing due to 1ZN1K on insulin.
 Testing is reassuring, BPP [DATE].
Recommendations

 Continue weekly antenatal testing till delivery .
                Eaton, Lizet

## 2022-10-06 ENCOUNTER — Other Ambulatory Visit: Payer: Self-pay

## 2022-10-06 ENCOUNTER — Emergency Department (HOSPITAL_COMMUNITY): Payer: No Typology Code available for payment source

## 2022-10-06 ENCOUNTER — Emergency Department (HOSPITAL_COMMUNITY)
Admission: EM | Admit: 2022-10-06 | Discharge: 2022-10-07 | Disposition: A | Discharge status: Home / Self Care | Payer: No Typology Code available for payment source | Attending: Emergency Medicine | Admitting: Emergency Medicine

## 2022-10-06 DIAGNOSIS — M25532 Pain in left wrist: Secondary | ICD-10-CM | POA: Diagnosis not present

## 2022-10-06 DIAGNOSIS — Z91040 Latex allergy status: Secondary | ICD-10-CM | POA: Insufficient documentation

## 2022-10-06 DIAGNOSIS — S0083XA Contusion of other part of head, initial encounter: Secondary | ICD-10-CM | POA: Insufficient documentation

## 2022-10-06 DIAGNOSIS — S1093XA Contusion of unspecified part of neck, initial encounter: Secondary | ICD-10-CM | POA: Insufficient documentation

## 2022-10-06 DIAGNOSIS — Z794 Long term (current) use of insulin: Secondary | ICD-10-CM | POA: Diagnosis not present

## 2022-10-06 DIAGNOSIS — Z87891 Personal history of nicotine dependence: Secondary | ICD-10-CM | POA: Diagnosis not present

## 2022-10-06 DIAGNOSIS — S40012A Contusion of left shoulder, initial encounter: Secondary | ICD-10-CM | POA: Insufficient documentation

## 2022-10-06 DIAGNOSIS — S40011A Contusion of right shoulder, initial encounter: Secondary | ICD-10-CM | POA: Diagnosis not present

## 2022-10-06 DIAGNOSIS — T07XXXA Unspecified multiple injuries, initial encounter: Secondary | ICD-10-CM

## 2022-10-06 DIAGNOSIS — S5012XA Contusion of left forearm, initial encounter: Secondary | ICD-10-CM | POA: Insufficient documentation

## 2022-10-06 DIAGNOSIS — S300XXA Contusion of lower back and pelvis, initial encounter: Secondary | ICD-10-CM | POA: Diagnosis not present

## 2022-10-06 DIAGNOSIS — S5011XA Contusion of right forearm, initial encounter: Secondary | ICD-10-CM | POA: Diagnosis not present

## 2022-10-06 MED ORDER — OXYCODONE-ACETAMINOPHEN 5-325 MG PO TABS: 1.0000 | ORAL_TABLET | Freq: Four times a day (QID) | ORAL | 0 refills | Status: AC | PRN

## 2022-10-06 MED ORDER — OXYCODONE-ACETAMINOPHEN 5-325 MG PO TABS
1.0000 | ORAL_TABLET | Freq: Once | ORAL | Status: AC
  Administered 2022-10-06: 1 via ORAL
  Filled 2022-10-06: qty 1
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
## 2022-10-06 NOTE — ED Notes (Signed)
Pt transported to CT ?
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
## 2022-10-06 NOTE — ED Notes (Signed)
Pt returned from CT °

## 2023-01-02 ENCOUNTER — Emergency Department (HOSPITAL_COMMUNITY): Payer: No Typology Code available for payment source

## 2023-01-02 ENCOUNTER — Encounter (HOSPITAL_COMMUNITY): Payer: Self-pay

## 2023-01-02 ENCOUNTER — Other Ambulatory Visit: Payer: Self-pay

## 2023-01-02 ENCOUNTER — Emergency Department (HOSPITAL_COMMUNITY)
Admission: EM | Admit: 2023-01-02 | Discharge: 2023-01-02 | Disposition: A | Discharge status: Home / Self Care | Payer: No Typology Code available for payment source | Attending: Emergency Medicine | Admitting: Emergency Medicine

## 2023-01-02 DIAGNOSIS — W010XXA Fall on same level from slipping, tripping and stumbling without subsequent striking against object, initial encounter: Secondary | ICD-10-CM | POA: Insufficient documentation

## 2023-01-02 DIAGNOSIS — Y9301 Activity, walking, marching and hiking: Secondary | ICD-10-CM | POA: Insufficient documentation

## 2023-01-02 DIAGNOSIS — M79602 Pain in left arm: Secondary | ICD-10-CM | POA: Diagnosis present

## 2023-01-02 DIAGNOSIS — Z794 Long term (current) use of insulin: Secondary | ICD-10-CM | POA: Diagnosis not present

## 2023-01-02 DIAGNOSIS — Z91040 Latex allergy status: Secondary | ICD-10-CM | POA: Insufficient documentation

## 2023-01-02 DIAGNOSIS — S63502A Unspecified sprain of left wrist, initial encounter: Secondary | ICD-10-CM

## 2023-01-02 MED ORDER — NAPROXEN 375 MG PO TABS: 375.0000 mg | ORAL_TABLET | Freq: Two times a day (BID) | ORAL | 0 refills | Status: AC

## 2023-01-02 NOTE — Discharge Instructions (Signed)
Follow-up with an orthopedic or sports medicine doctor to be rechecked if your symptoms persist

## 2023-01-02 NOTE — ED Triage Notes (Signed)
Pt arrived POV. C/O L arm pain after falling while going up the steps, pt landed on her left side and L arm took most of the impact per pt. Wrist is tender to palpation. Pulse strong. Injury occurred Tuesday. No obvious deformity noted.

## 2023-01-02 NOTE — ED Provider Notes (Signed)
[place] EMERGENCY DEPARTMENT AT [facility] Provider Note   CSN: [id number] Arrival date & time: 01/02/23  1143     History  Chief Complaint  Patient presents with   Arm Injury    Katherine Maldonado is a 37 y.o. female.   Arm Injury    Patient presented to the ER for evaluation of left arm pain after fall a couple days ago.  Patient states she was walking when she slipped fell landing on her left arm.  Patient is having sharp pain in her left wrist.  It hurts with any movement and palpation.  She denies any numbness or weakness.  No other injuries  Home Medications Prior to Admission medications   Medication Sig Start Date End Date Taking? Authorizing Provider  naproxen (NAPROSYN) 375 MG tablet Take 1 tablet (375 mg total) by mouth 2 (two) times daily. 01/02/23  Yes Linwood Dibbles, MD  albuterol (VENTOLIN HFA) 108 (90 Base) MCG/ACT inhaler Inhale 2 puffs into the lungs every 6 (six) hours as needed for wheezing. 06/05/21   Tollie Eth, NP  cyclobenzaprine (FLEXERIL) 10 MG tablet Take 1 tablet (10 mg total) by mouth 3 (three) times daily as needed for muscle spasms. 01/24/22   Vanetta Mulders, MD  Insulin Pen Needle (PEN NEEDLES) 32G X 4 MM MISC USE ONE NEW NEEDLE PER WEEK WITH INJECTION OF OZEMPIC 06/10/21   Tollie Eth, NP  metFORMIN (GLUCOPHAGE) 500 MG tablet Take 1 tablet (500 mg total) by mouth at bedtime. 09/06/21   Tollie Eth, NP  ondansetron (ZOFRAN-ODT) 8 MG disintegrating tablet Take 1 tablet (8 mg total) by mouth every 8 (eight) hours as needed for nausea. 09/06/21   Tollie Eth, NP  oxyCODONE-acetaminophen (PERCOCET) 5-325 MG tablet Take 1 tablet by mouth every 6 (six) hours as needed for severe pain or moderate pain. 10/06/22   Molpus, John, MD  pantoprazole (PROTONIX) 40 MG tablet Take 1 tablet (40 mg total) by mouth daily. 09/25/21   Tollie Eth, NP  Prenatal 27-1 MG TABS Take 1 tablet by mouth daily. 03/07/21   Ohlman Bing, MD  promethazine  (PHENERGAN) 25 MG tablet Take 1/2 tab (12.5mg ) to 1 tab (25mg ) every 6 hours as needed for vomiting not controlled with zofran. 09/06/21   Tollie Eth, NP  rizatriptan (MAXALT) 10 MG tablet Take 1 tablet (10 mg total) by mouth as needed for migraine. May repeat in 2 hours if needed for ongoing pain. Do not take more than 2 doses in a 24 hour period. 06/05/21   Tollie Eth, NP      Allergies    Latex and Penicillins    Review of Systems   Review of Systems  Physical Exam Updated Vital Signs BP 113/82 (BP Location: Right Arm)   Pulse 75   Temp 98.7 F (37.1 C) (Oral)   Resp 20   Ht 1.702 m (5\' 7" )   Wt 104.3 kg   SpO2 100%   BMI 36.02 kg/m  Physical Exam Vitals and nursing note reviewed.  Constitutional:      General: She is not in acute distress.    Appearance: She is well-developed.  HENT:     Head: Normocephalic and atraumatic.     Right Ear: External ear normal.     Left Ear: External ear normal.  Eyes:     General: No scleral icterus.       Right eye: No discharge.  Left eye: No discharge.     Conjunctiva/sclera: Conjunctivae normal.  Neck:     Trachea: No tracheal deviation.  Cardiovascular:     Rate and Rhythm: Normal rate.  Pulmonary:     Effort: Pulmonary effort is normal. No respiratory distress.     Breath sounds: No stridor.  Abdominal:     General: There is no distension.  Musculoskeletal:        General: Tenderness present. No swelling or deformity.     Cervical back: Neck supple.     Comments: Tenderness palpation diffusely left wrist dorsal aspect of left hand, no bruising no edema, strong capillary refill, sensation intact  Skin:    General: Skin is warm and dry.     Findings: No rash.  Neurological:     Mental Status: She is alert. Mental status is at baseline.     Cranial Nerves: No dysarthria or facial asymmetry.     Motor: No seizure activity.     ED Results / Procedures / Treatments   Labs (all labs ordered are listed, but only  abnormal results are displayed) Labs Reviewed - No data to display  EKG None  Radiology DG Hand Complete Left  Result Date: 01/02/2023 CLINICAL DATA:  Pain after injury EXAM: LEFT HAND - COMPLETE 3 VIEW; LEFT WRIST - COMPLETE 3 VIEW COMPARISON:  10/06/2022 FINDINGS: No fracture or dislocation. Preserved joint spaces and bone mineralization. If there is further concern of injury including for a scaphoid injury, recommend treatment with follow-up imaging in 7-10 days to assess for occult abnormality. IMPRESSION: No acute osseous abnormality Electronically Signed   By: Karen Kays M.D.   On: 01/02/2023 13:38   DG Wrist Complete Left  Result Date: 01/02/2023 CLINICAL DATA:  Pain after injury EXAM: LEFT HAND - COMPLETE 3 VIEW; LEFT WRIST - COMPLETE 3 VIEW COMPARISON:  10/06/2022 FINDINGS: No fracture or dislocation. Preserved joint spaces and bone mineralization. If there is further concern of injury including for a scaphoid injury, recommend treatment with follow-up imaging in 7-10 days to assess for occult abnormality. IMPRESSION: No acute osseous abnormality Electronically Signed   By: Karen Kays M.D.   On: 01/02/2023 13:38    Procedures Procedures    Medications Ordered in ED Medications - No data to display  ED Course/ Medical Decision Making/ A&P                             Medical Decision Making Amount and/or Complexity of Data Reviewed Radiology: ordered.  Risk Prescription drug management.   Trays without signs of fracture or dislocation.  No specific snuffbox tenderness.  Will place in a splint.  Outpatient follow-up with orthopedics or sports medicine if her symptoms or not improving in the next week.  Patient has a wrist brace with her and will go ahead and help her apply that.        Final Clinical Impression(s) / ED Diagnoses Final diagnoses:  Sprain of left wrist, initial encounter    Rx / DC Orders ED Discharge Orders          Ordered    naproxen  (NAPROSYN) 375 MG tablet  2 times daily        01/02/23 1643              Linwood Dibbles, MD 01/02/23 1644

## 2023-03-28 IMAGING — DX DG LUMBAR SPINE 2-3V
3 series · 3 of 3 positions shown · non-contrast
Comparison: None Available.

CLINICAL DATA: Low back pain

EXAM:
LUMBAR SPINE - 2-3 VIEW

[l-spine ap (1 of 2)]
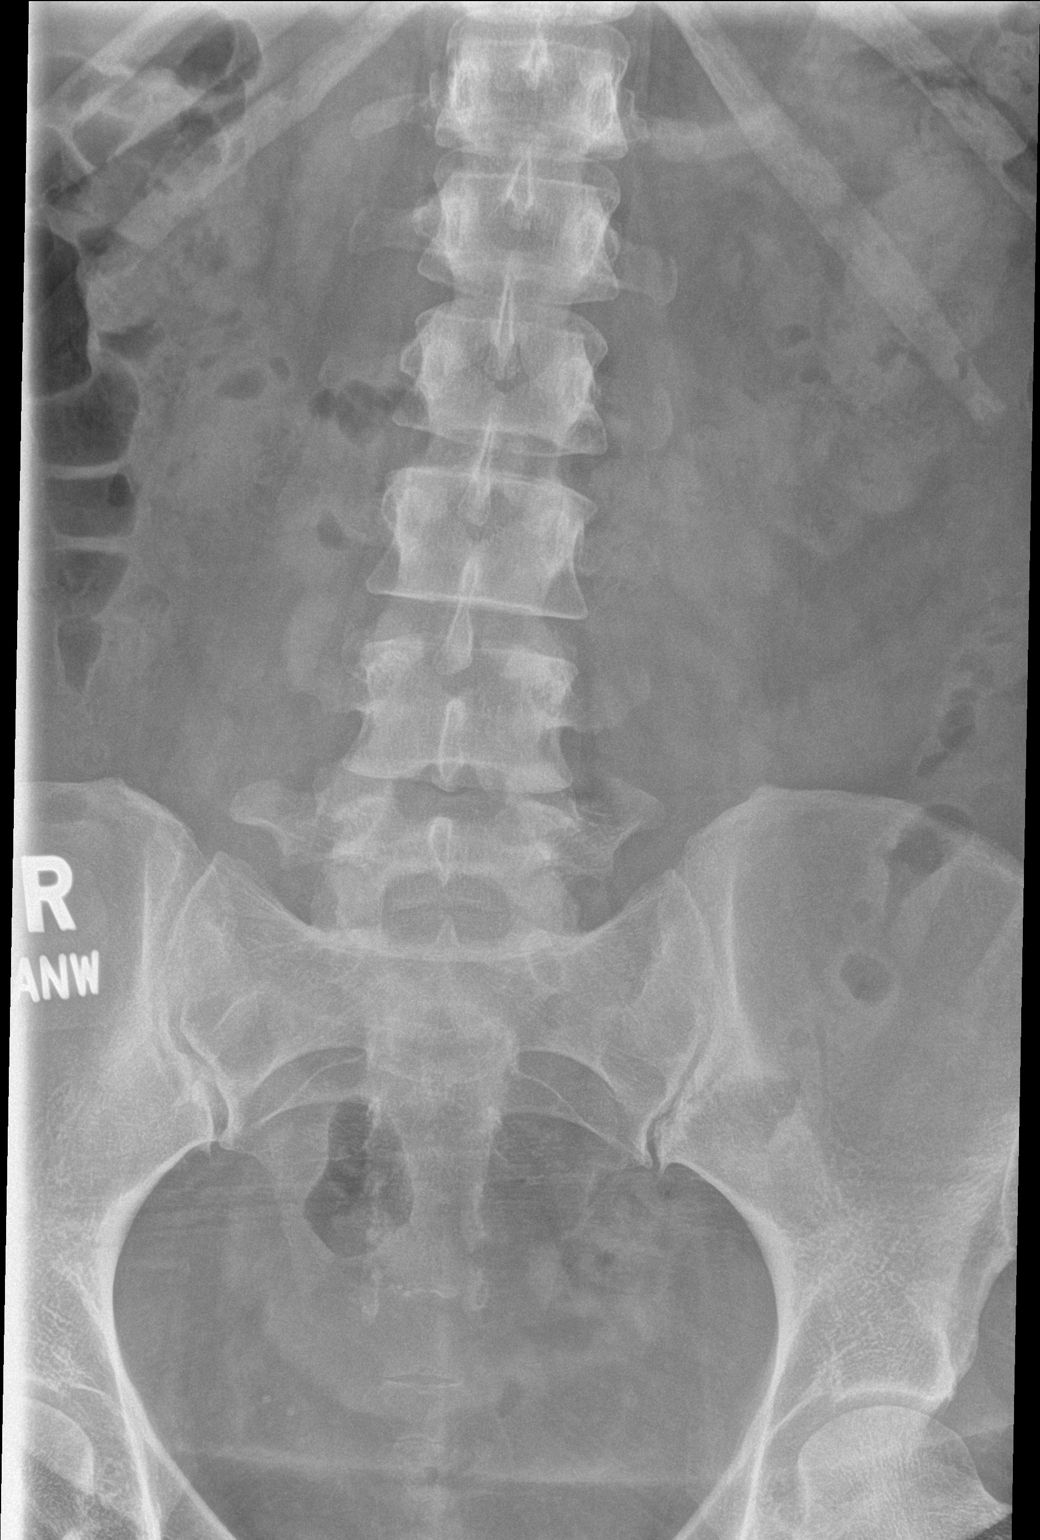

[l-spine lat]
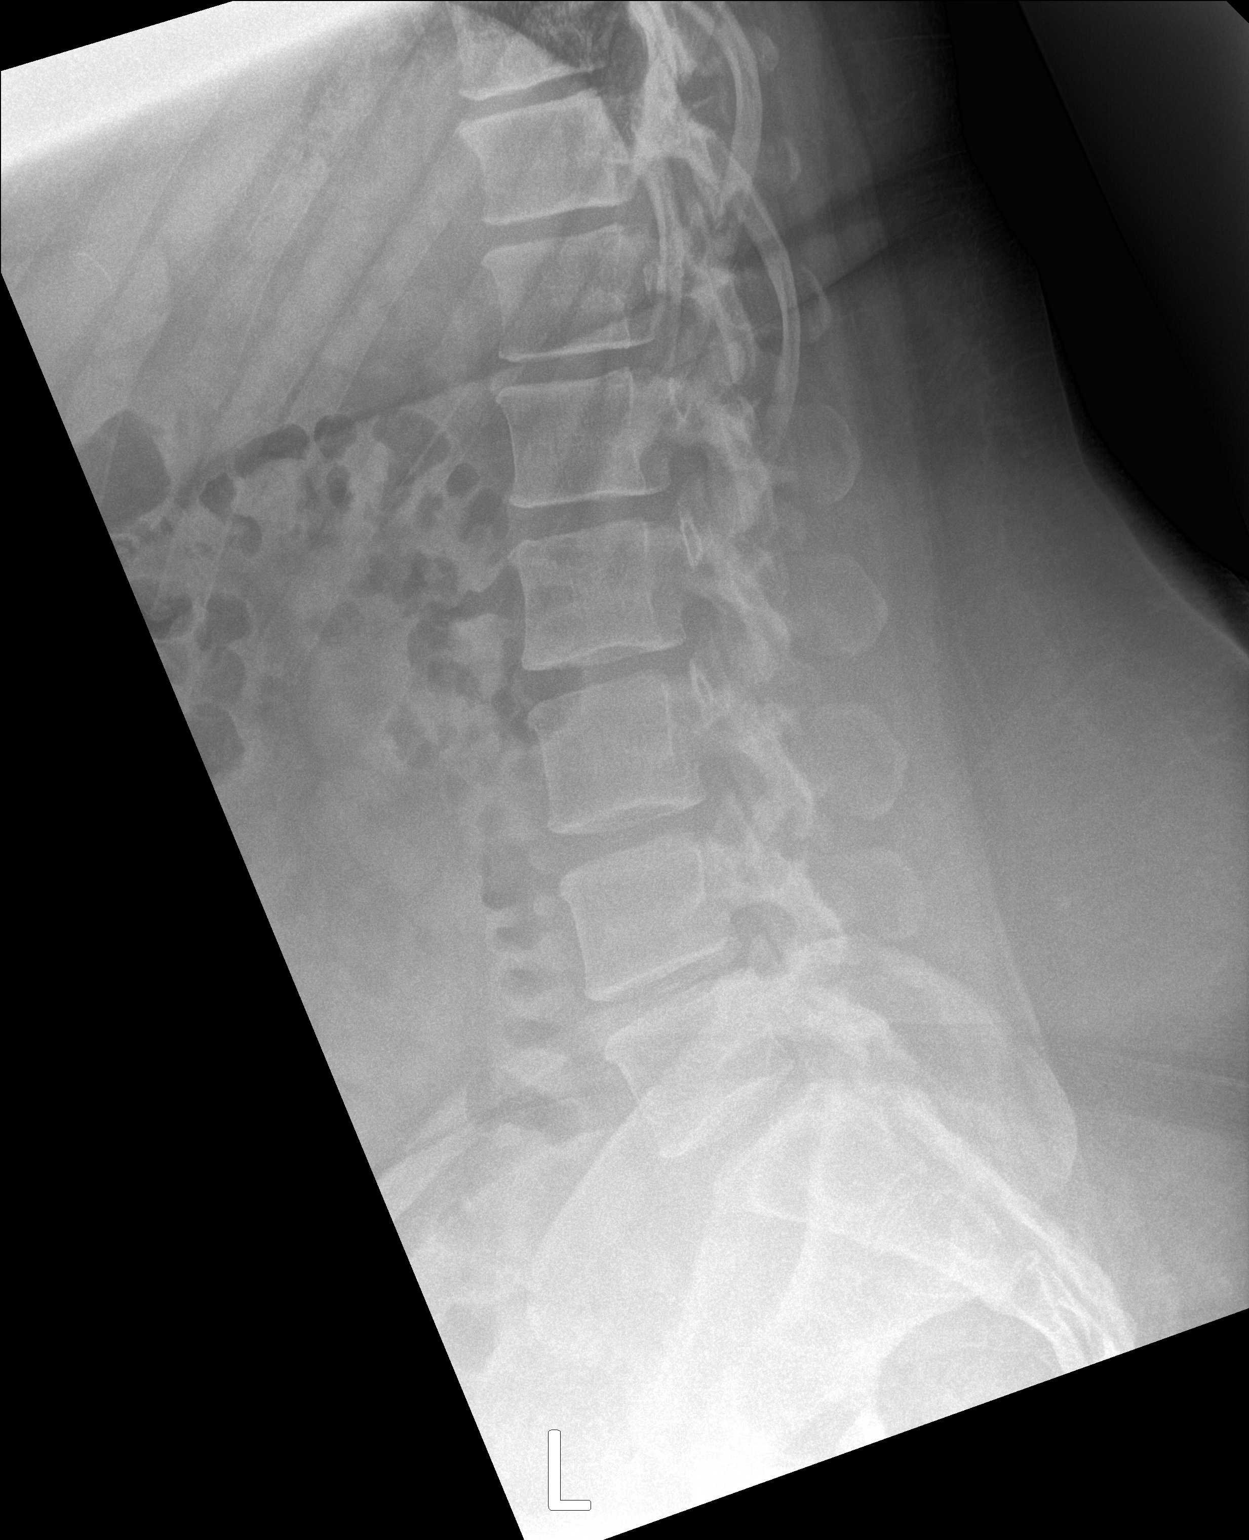

[l-spine ap (2 of 2)]
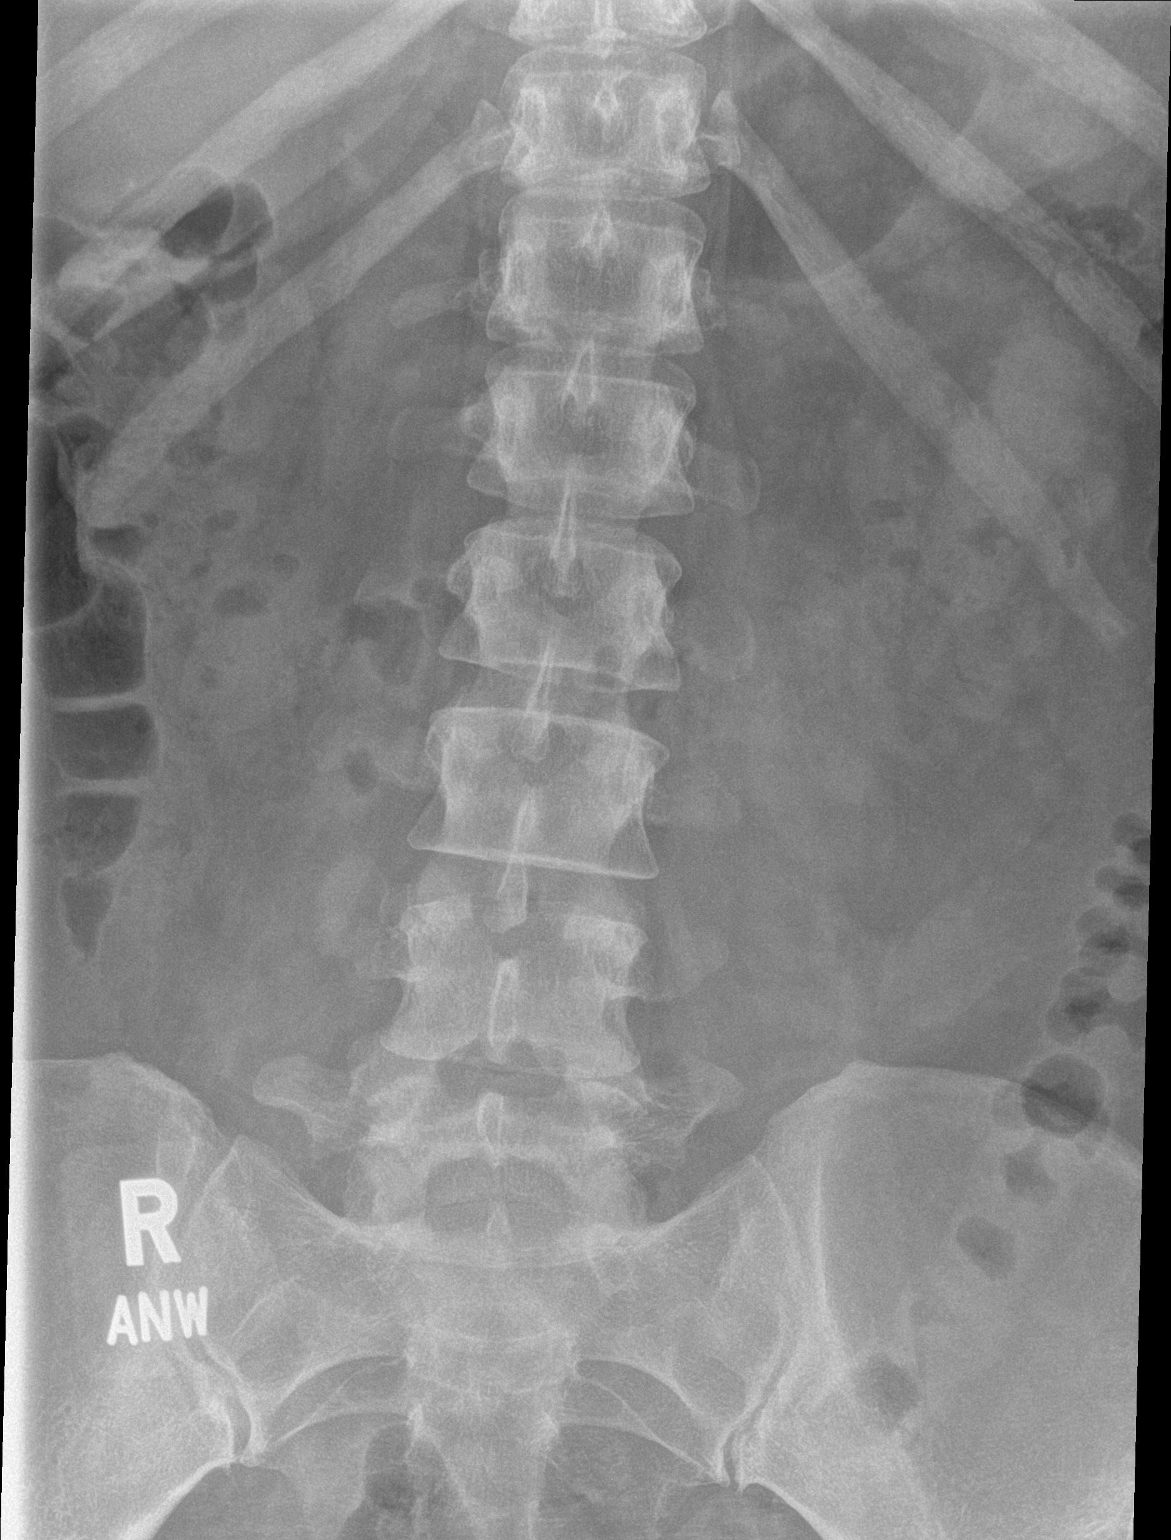

[3 of 3 positions shown; findings below may reference images not displayed]

FINDINGS: There is no evidence of lumbar spine fracture. Alignment is normal.
Intervertebral disc spaces are maintained.
IMPRESSION: Negative.

## 2024-01-11 ENCOUNTER — Ambulatory Visit
Admission: EM | Admit: 2024-01-11 | Discharge: 2024-01-11 | Disposition: A | Discharge status: Home / Self Care | Payer: Self-pay | Attending: Family Medicine | Admitting: Family Medicine

## 2024-01-11 DIAGNOSIS — H6993 Unspecified Eustachian tube disorder, bilateral: Secondary | ICD-10-CM

## 2024-01-11 DIAGNOSIS — E119 Type 2 diabetes mellitus without complications: Secondary | ICD-10-CM

## 2024-01-11 DIAGNOSIS — E1165 Type 2 diabetes mellitus with hyperglycemia: Secondary | ICD-10-CM

## 2024-01-11 DIAGNOSIS — J329 Chronic sinusitis, unspecified: Secondary | ICD-10-CM

## 2024-01-11 DIAGNOSIS — Z760 Encounter for issue of repeat prescription: Secondary | ICD-10-CM

## 2024-01-11 DIAGNOSIS — J40 Bronchitis, not specified as acute or chronic: Secondary | ICD-10-CM

## 2024-01-11 DIAGNOSIS — K219 Gastro-esophageal reflux disease without esophagitis: Secondary | ICD-10-CM

## 2024-01-11 LAB — POCT FASTING CBG KUC MANUAL ENTRY: POCT Glucose (KUC): 213 mg/dL — AB (ref 70–99)

## 2024-01-11 MED ORDER — ALBUTEROL SULFATE HFA 108 (90 BASE) MCG/ACT IN AERS: 2.0000 | INHALATION_SPRAY | Freq: Four times a day (QID) | RESPIRATORY_TRACT | 0 refills | Status: DC | PRN

## 2024-01-11 MED ORDER — PANTOPRAZOLE SODIUM 40 MG PO TBEC: 40.0000 mg | DELAYED_RELEASE_TABLET | Freq: Every day | ORAL | 0 refills | Status: DC

## 2024-01-11 MED ORDER — CEFDINIR 300 MG PO CAPS: 300.0000 mg | ORAL_CAPSULE | Freq: Two times a day (BID) | ORAL | 0 refills | Status: AC

## 2024-01-11 MED ORDER — CETIRIZINE HCL 10 MG PO TABS: 10.0000 mg | ORAL_TABLET | Freq: Every day | ORAL | 0 refills | Status: AC

## 2024-01-11 MED ORDER — PROMETHAZINE-DM 6.25-15 MG/5ML PO SYRP: 5.0000 mL | ORAL_SOLUTION | Freq: Three times a day (TID) | ORAL | 0 refills | Status: DC | PRN

## 2024-01-11 MED ORDER — PSEUDOEPHEDRINE HCL 30 MG PO TABS: 30.0000 mg | ORAL_TABLET | Freq: Three times a day (TID) | ORAL | 0 refills | Status: AC | PRN

## 2024-01-11 NOTE — ED Provider Notes (Signed)
 [facility] URG[facility]  Note:  This document was prepared using Conservation officer, historic buildings and may include unintentional dictation errors.  MRN: [id number] DOB: 24-Dec-1985  Subjective:   Katherine Maldonado is a 38 y.o. female presenting for 2 primary concerns.  Reports 1 month history of persistent cough with worsening symptoms in the past week.  Has had a lot of sinus congestion and sinus pressure, bilateral ear fullness and fluid sensation of both ears.  Has significant coughing spells with associated chest pain.  No shortness of breath or wheezing.  Has had bouts of bronchitis over the years a few times a year.  Would like a refill of her inhaler. Is requesting medication refill.  Was previously managed by [facility] primary care but she lost her insurance and was lost to follow-up.  She does not check her blood sugars at home.  Does not know her dosing of metformin .  Would like a refill of her pantoprazole  as well.  She believes she will have a new primary care insurance in the near future.  No current facility-administered medications for this encounter.  Current Outpatient Medications:    albuterol  (VENTOLIN  HFA) 108 (90 Base) MCG/ACT inhaler, Inhale 2 puffs into the lungs every 6 (six) hours as needed for wheezing., Disp: 2 each, Rfl: 11   cyclobenzaprine  (FLEXERIL ) 10 MG tablet, Take 1 tablet (10 mg total) by mouth 3 (three) times daily as needed for muscle spasms., Disp: 20 tablet, Rfl: 0   Insulin  Pen Needle (PEN NEEDLES) 32G X 4 MM MISC, USE ONE NEW NEEDLE PER WEEK WITH INJECTION OF OZEMPIC , Disp: 90 each, Rfl: 11   metFORMIN  (GLUCOPHAGE ) 500 MG tablet, Take 1 tablet (500 mg total) by mouth at bedtime., Disp: 90 tablet, Rfl: 3   naproxen  (NAPROSYN ) 375 MG tablet, Take 1 tablet (375 mg total) by mouth 2 (two) times daily., Disp: 20 tablet, Rfl: 0   ondansetron  (ZOFRAN -ODT) 8 MG disintegrating tablet, Take 1 tablet (8 mg total) by mouth every 8 (eight) hours as needed  for nausea., Disp: 30 tablet, Rfl: 3   oxyCODONE -acetaminophen  (PERCOCET) 5-325 MG tablet, Take 1 tablet by mouth every 6 (six) hours as needed for severe pain or moderate pain., Disp: 12 tablet, Rfl: 0   pantoprazole  (PROTONIX ) 40 MG tablet, Take 1 tablet (40 mg total) by mouth daily., Disp: 30 tablet, Rfl: 3   Prenatal 27-1 MG TABS, Take 1 tablet by mouth daily., Disp: 30 tablet, Rfl: 11   promethazine  (PHENERGAN ) 25 MG tablet, Take 1/2 tab (12.5mg ) to 1 tab (25mg ) every 6 hours as needed for vomiting not controlled with zofran ., Disp: 30 tablet, Rfl: 2   rizatriptan  (MAXALT ) 10 MG tablet, Take 1 tablet (10 mg total) by mouth as needed for migraine. May repeat in 2 hours if needed for ongoing pain. Do not take more than 2 doses in a 24 hour period., Disp: 10 tablet, Rfl: 0   Allergies  Allergen Reactions   Latex Swelling and Rash   Penicillins Other (See Comments) and Rash    Did it involve swelling of the face/tongue/throat, SOB, or low BP? Unknown  Did it involve sudden or severe rash/hives, skin peeling, or any reaction on the inside of your mouth or nose? Unknown  Did you need to seek medical attention at a hospital or doctor's office? Unknown  When did it last happen?  childhood      If all above answers are NO, may proceed with cephalosporin use.  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--partner declined testing   Depression    Encounter for induction of labor 02/13/2021   Encounter for postoperative care 05/06/2021   Encounter for postpartum visit 03/04/2021   Flank pain 05/06/2021   GBS (group B Streptococcus carrier), +RV culture, currently pregnant 02/06/2021   Headache in pregnancy, antepartum, second trimester  09/12/2020   History of gestational diabetes    none since 02-14-2021 childbirth   History of pre-eclampsia    none since 02-14-2021 childbirth   Migraines    Numbness 04/17/2021   and burning of left thigh at times   Obesity in pregnancy 12/21/2020   Preeclampsia, severe, third trimester 01/30/2021   Preoperative exam for gynecologic surgery 04/22/2021   Unwanted fertility 12/06/2020   btl papers 12/06/20    Wears contact lenses      Past Surgical History:  Procedure Laterality Date   LAPAROSCOPIC BILATERAL SALPINGECTOMY Bilateral 04/24/2021   Procedure: LAPAROSCOPIC BILATERAL SALPINGECTOMY;  Surgeon: Zina Jerilynn LABOR, MD;  [facility]: [facility];  Service: Gynecology;  Laterality: Bilateral;   TOOTH EXTRACTION     last done 2020    Family History  Adopted: Yes  Problem Relation Age of Onset   Thyroid disease Mother    COPD Mother    Diabetes Father    Hypertension Father    Thrombocytopenia Father     Social History   Tobacco Use   Smoking status: Former    Types: Cigarettes   Smokeless tobacco: Never   Tobacco comments:    when teenager smoked cigarettes, vaping with flavoring in 2021 until found out pregnant  Vaping Use   Vaping status: Former  Substance Use Topics   Alcohol use: Yes    Comment: occasionally   Drug use: Not Currently    Types: Marijuana    Comment: marijuana last used October 2021    ROS   Objective:   Vitals: BP (!) 143/96 (BP Location: Left Arm)   Pulse 72   Temp 98 F (36.7 C) (Oral)   Resp 18   LMP 01/05/2024 (Exact Date)   SpO2 95%   Breastfeeding No   Physical Exam Constitutional:      General: She is not in acute distress.    Appearance: Normal appearance. She is well-developed and normal weight. She is not ill-appearing, toxic-appearing or diaphoretic.  HENT:     Head: Normocephalic and atraumatic.     Right Ear: Ear canal and external ear normal. No drainage or tenderness. No middle ear effusion. There is no  impacted cerumen. Tympanic membrane is not erythematous or bulging.     Left Ear: Ear canal and external ear normal. No drainage or tenderness.  No middle ear effusion. There is no impacted cerumen. Tympanic membrane is not erythematous or bulging.     Ears:     Comments: Bilateral ear effusion without erythema, bulging TMs.    Nose: Congestion present. No rhinorrhea.     Mouth/Throat:     Mouth: Mucous membranes are moist. No oral lesions.     Pharynx: No pharyngeal swelling, oropharyngeal exudate, posterior oropharyngeal erythema or uvula swelling.     Tonsils: No tonsillar exudate or tonsillar abscesses.   Eyes:  General: No scleral icterus.       Right eye: No discharge.        Left eye: No discharge.     Extraocular Movements: Extraocular movements intact.     Right eye: Normal extraocular motion.     Left eye: Normal extraocular motion.     Conjunctiva/sclera: Conjunctivae normal.    Cardiovascular:     Rate and Rhythm: Normal rate and regular rhythm.     Heart sounds: Normal heart sounds. No murmur heard.    No friction rub. No gallop.  Pulmonary:     Effort: Pulmonary effort is normal. No respiratory distress.     Breath sounds: No stridor. No wheezing, rhonchi or rales.  Chest:     Chest wall: No tenderness.   Musculoskeletal:     Cervical back: Normal range of motion and neck supple.  Lymphadenopathy:     Cervical: No cervical adenopathy.   Skin:    General: Skin is warm and dry.   Neurological:     General: No focal deficit present.     Mental Status: She is alert and oriented to person, place, and time.   Psychiatric:        Mood and Affect: Mood normal.        Behavior: Behavior normal.     Results for orders placed or performed during the hospital encounter of 01/11/24 (from the past 24 hours)  POCT CBG (manual entry)     Status: Abnormal   Collection Time: 01/11/24 12:26 PM  Result Value Ref Range   POCT Glucose ([facility]) 213 (A) 70 - 99 mg/dL     Assessment and Plan :   PDMP not reviewed this encounter.  1. Sinobronchitis   2. Eustachian tube dysfunction, bilateral   3. Type 2 diabetes mellitus treated without insulin  (HCC)   4. Medication refill   5. Type 2 diabetes mellitus with hyperglycemia, without long-term current use of insulin  (HCC)   6. Bronchitis   7. Gastroesophageal reflux disease, unspecified whether esophagitis present    High suspicion for sinobronchitis and recommended cefdinir.  Refilled her albuterol .  At this stage I should avoid prednisone  despite her significant lower respiratory symptoms and eustachian tube dysfunction out of the risk for her diabetes and unknown A1c level, metformin  dosing.  Will pursue labs and dose appropriately based off of those results tomorrow.  Refilled her pantoprazole .  Deferred imaging given clear cardiopulmonary exam, hemodynamically stable vital signs. Counseled patient on potential for adverse effects with medications prescribed/recommended today, ER and return-to-clinic precautions discussed, patient verbalized understanding.    Christopher Savannah, [place] 01/11/24 1328

## 2024-01-11 NOTE — ED Triage Notes (Signed)
 Pt reports cough x 1 month; ears pressure , states she feels she has water in the ears, sinus pressure x 1 week. Pt no taking ay meds for complaints.   Request albuterol  inhaler, metformin , pantoprazole  refill.

## 2024-01-11 NOTE — Discharge Instructions (Signed)
 We will manage this as a sinobronchitis infection with cefdinir. For sore throat or cough try using a honey-based tea. Use 3 teaspoons of honey with juice squeezed from half lemon. Place shaved pieces of ginger into 1/2-1 cup of water and warm over stove top. Then mix the ingredients and repeat every 4 hours as needed. Please take ibuprofen  600mg  every 6 hours with food alternating with OR taken together with Tylenol  500mg -650mg  every 6 hours for throat pain, fevers, aches and pains. Hydrate very well with at least 2 liters of water. Eat light meals such as soups (chicken and noodles, vegetable, chicken and wild rice).  Do not eat foods that you are allergic to.  Taking an antihistamine like Zyrtec can help against postnasal drainage, sinus congestion which can cause sinus pain, sinus headaches, throat pain, painful swallowing, coughing.  You can take this together with pseudoephedrine (Sudafed) at a dose of 30 mg 3 times a day or twice daily as needed for the same kind of nasal drip, congestion.  Use cough medication as needed.   Will update your labs tomorrow and prescribe metformin  at an appropriate dose.    For diabetes or elevated blood sugar, please make sure you are limiting and avoiding starchy, carbohydrate foods like pasta, breads, sweet breads, pastry, rice, potatoes, desserts. These foods can elevate your blood sugar. Also, limit and avoid drinks that contain a lot of sugar such as sodas, sweet teas, fruit juices.  Drinking plain water will be much more helpful, try 64 ounces of water daily.  It is okay to flavor your water naturally by cutting cucumber, lemon, mint or lime, placing it in a picture with water and drinking it over a period of 24-48 hours as long as it remains refrigerated.  For elevated blood pressure, make sure you are monitoring salt in your diet.  Do not eat restaurant foods and limit processed foods at home. I highly recommend you prepare and cook your own foods at home.   Processed foods include things like frozen meals, pre-seasoned meats and dinners, deli meats, canned foods as these foods contain a high amount of sodium/salt.  Make sure you are paying attention to sodium labels on foods you buy at the grocery store. Buy your spices separately such as garlic powder, onion powder, cumin, cayenne, parsley flakes so that you can avoid seasonings that contain salt. However, salt-free seasonings are available and can be used, an example is Mrs. Dash and includes a lot of different mixtures that do not contain salt.  Lastly, when cooking using oils that are healthier for you is important. This includes olive oil, avocado oil, canola oil. We have discussed a lot of foods to avoid but below is a list of foods that can be very healthy to use in your diet whether it is for diabetes, cholesterol, high blood pressure, or in general healthy eating.  Salads - kale, spinach, cabbage, spring mix, arugula Fruits - avocadoes, berries (blueberries, raspberries, blackberries), apples, oranges, pomegranate, grapefruit, kiwi Vegetables - asparagus, cauliflower, broccoli, green beans, brussel sprouts, bell peppers, beets; stay away from or limit starchy vegetables like potatoes, carrots, peas Other general foods - kidney beans, egg whites, almonds, walnuts, sunflower seeds, pumpkin seeds, fat free yogurt, almond milk, flax seeds, quinoa, oats  Meat - It is better to eat lean meats and limit your red meat including pork to once a week.  Wild caught fish, chicken breast are good options as they tend to be leaner sources of good  protein. Still be mindful of the sodium labels for the meats you buy.  DO NOT EAT ANY FOODS ON THIS LIST THAT YOU ARE ALLERGIC TO. For more specific needs, I highly recommend consulting a dietician or nutritionist but this can definitely be a good starting point.

## 2024-01-12 ENCOUNTER — Ambulatory Visit (HOSPITAL_COMMUNITY): Payer: Self-pay

## 2024-01-12 LAB — COMPREHENSIVE METABOLIC PANEL WITH GFR
ALT: 16 IU/L (ref 0–32)
AST: 14 IU/L (ref 0–40)
Albumin: 4.4 g/dL (ref 3.9–4.9)
Alkaline Phosphatase: 124 IU/L — ABNORMAL HIGH (ref 44–121)
BUN/Creatinine Ratio: 9 (ref 9–23)
BUN: 7 mg/dL (ref 6–20)
Bilirubin Total: 0.2 mg/dL (ref 0.0–1.2)
CO2: 18 mmol/L — ABNORMAL LOW (ref 20–29)
Calcium: 9.3 mg/dL (ref 8.7–10.2)
Chloride: 102 mmol/L (ref 96–106)
Creatinine, Ser: 0.75 mg/dL (ref 0.57–1.00)
Globulin, Total: 2.9 g/dL (ref 1.5–4.5)
Glucose: 197 mg/dL — ABNORMAL HIGH (ref 70–99)
Potassium: 4.2 mmol/L (ref 3.5–5.2)
Sodium: 138 mmol/L (ref 134–144)
Total Protein: 7.3 g/dL (ref 6.0–8.5)
eGFR: 104 mL/min/{1.73_m2} (ref >59)

## 2024-01-12 LAB — HEMOGLOBIN A1C
Est. average glucose Bld gHb Est-mCnc: 200 mg/dL
Hgb A1c MFr Bld: 8.6 % — ABNORMAL HIGH (ref 4.8–5.6)

## 2024-01-12 MED ORDER — METFORMIN HCL 1000 MG PO TABS: 1000.0000 mg | ORAL_TABLET | Freq: Two times a day (BID) | ORAL | 0 refills | Status: AC

## 2024-01-12 NOTE — Telephone Encounter (Signed)
 Labs reviewed. Will send for metformin  1000mg  twice daily to titrate over timeframe of 2 to 3 weeks.  Follow-up with new PCP.

## 2024-01-27 ENCOUNTER — Ambulatory Visit: Payer: Self-pay

## 2024-01-27 NOTE — Telephone Encounter (Signed)
 FYI Only or Action Required?: FYI only for provider.  Patient was last seen in primary care on 12/19/2021 by Early, Camie BRAVO, NP.  Called Nurse Triage reporting Cough.  Symptoms began several weeks ago.  Interventions attempted: Prescription medications: Albuterol  inhaler.  Symptoms are: unchanged.  Triage Disposition: See Physician Within 24 Hours, See HCP Within 4 Hours (Or PCP Triage)  Patient/caregiver understands and will follow disposition?: Yes  **Per patient request, scheduled for 8/6**             Copied from CRM [id number]. Topic: Clinical - Red Word Triage >> Jan 27, 2024 10:15 AM Suzen RAMAN wrote: Red Word that prompted transfer to Nurse Triage: Productive Cough Reason for Disposition  Wheezing is present  [1] Continuous (nonstop) coughing interferes with work or school AND [2] no improvement using cough treatment per Care Advice  Answer Assessment - Initial Assessment Questions 1. ONSET: When did the cough begin?      Several weeks  2. SEVERITY: How bad is the cough today?      7/10 continuous cough  3. SPUTUM: Describe the color of your sputum (e.g., none, dry cough; clear, white, yellow, green)     Dry cough   4. HEMOPTYSIS: Are you coughing up any blood? If Yes, ask: How much? (e.g., flecks, streaks, tablespoons, etc.)     No   5. DIFFICULTY BREATHING: Are you having difficulty breathing? If Yes, ask: How bad is it? (e.g., mild, moderate, severe)      Only chest pain due to cough   6. FEVER: Do you have a fever? If Yes, ask: What is your temperature, how was it measured, and when did it start?     No   7. CARDIAC HISTORY: Do you have any history of heart disease? (e.g., heart attack, congestive heart failure)      No   8. LUNG HISTORY: Do you have any history of lung disease?  (e.g., pulmonary embolus, asthma, emphysema)     Asthma   9. PE RISK FACTORS: Do you have a history of blood clots? (or: recent major surgery, recent  prolonged travel, bedridden)     No   10. OTHER SYMPTOMS: Do you have any other symptoms? (e.g., runny nose, wheezing, chest pain)       Wheezing in between cough   11. PREGNANCY: Is there any chance you are pregnant? When was your last menstrual period?       No   In June 2025 she was diagnosed with Bronchitis. She still has lingering cough. Patient does not have PCP, and wants to be scheduled specifically on 8/6, as that is her only day she is available. Appt. Scheduled for 8/6 at 2:20pm. She agrees to be seen in UC for evaluation/treatment until her appointment on 8/6.  Protocols used: Cough - Acute Productive-A-AH

## 2024-02-17 ENCOUNTER — Ambulatory Visit: Payer: Self-pay | Admitting: Internal Medicine

## 2024-02-17 ENCOUNTER — Ambulatory Visit: Admitting: Family Medicine

## 2024-03-11 ENCOUNTER — Ambulatory Visit
Admission: EM | Admit: 2024-03-11 | Discharge: 2024-03-11 | Disposition: A | Discharge status: Home / Self Care | Payer: Self-pay | Attending: Family Medicine | Admitting: Family Medicine

## 2024-03-11 DIAGNOSIS — N760 Acute vaginitis: Secondary | ICD-10-CM | POA: Insufficient documentation

## 2024-03-11 DIAGNOSIS — E119 Type 2 diabetes mellitus without complications: Secondary | ICD-10-CM | POA: Insufficient documentation

## 2024-03-11 LAB — GLUCOSE, POCT (MANUAL RESULT ENTRY): POCT Glucose (KUC): 174 mg/dL — AB (ref 70–99)

## 2024-03-11 MED ORDER — GLIPIZIDE 5 MG PO TABS: 2.5000 mg | ORAL_TABLET | Freq: Every day | ORAL | 1 refills | Status: AC

## 2024-03-11 MED ORDER — FLUCONAZOLE 150 MG PO TABS: 150.0000 mg | ORAL_TABLET | ORAL | 0 refills | Status: DC

## 2024-03-11 NOTE — Discharge Instructions (Addendum)
For diabetes or elevated blood sugar, please make sure you are limiting and avoiding starchy, carbohydrate foods like pasta, breads, sweet breads, pastry, rice, potatoes, desserts. These foods can elevate your blood sugar. Also, limit and avoid drinks that contain a lot of sugar such as sodas, sweet teas, fruit juices.  Drinking plain water will be much more helpful, try 64 ounces of water daily.  It is okay to flavor your water naturally by cutting cucumber, lemon, mint or lime, placing it in a picture with water and drinking it over a period of 24-48 hours as long as it remains refrigerated. ? ?For elevated blood pressure, make sure you are monitoring salt in your diet.  Do not eat restaurant foods and limit processed foods at home. I highly recommend you prepare and cook your own foods at home.  Processed foods include things like frozen meals, pre-seasoned meats and dinners, deli meats, canned foods as these foods contain a high amount of sodium/salt.  Make sure you are paying attention to sodium labels on foods you buy at the grocery store. Buy your spices separately such as garlic powder, onion powder, cumin, cayenne, parsley flakes so that you can avoid seasonings that contain salt. However, salt-free seasonings are available and can be used, an example is Mrs. Dash and includes a lot of different mixtures that do not contain salt. ? ?Lastly, when cooking using oils that are healthier for you is important. This includes olive oil, avocado oil, canola oil. We have discussed a lot of foods to avoid but below is a list of foods that can be very healthy to use in your diet whether it is for diabetes, cholesterol, high blood pressure, or in general healthy eating. ? ?Salads - kale, spinach, cabbage, spring mix, arugula ?Fruits - avocadoes, berries (blueberries, raspberries, blackberries), apples, oranges, pomegranate, grapefruit, kiwi ?Vegetables - asparagus, cauliflower, broccoli, green beans, brussel sprouts,  bell peppers, beets; stay away from or limit starchy vegetables like potatoes, carrots, peas ?Other general foods - kidney beans, egg whites, almonds, walnuts, sunflower seeds, pumpkin seeds, fat free yogurt, almond milk, flax seeds, quinoa, oats  ?Meat - It is better to eat lean meats and limit your red meat including pork to once a week.  Wild caught fish, chicken breast are good options as they tend to be leaner sources of good protein. Still be mindful of the sodium labels for the meats you buy. ? ?DO NOT EAT ANY FOODS ON THIS LIST THAT YOU ARE ALLERGIC TO. For more specific needs, I highly recommend consulting a dietician or nutritionist but this can definitely be a good starting point. ? ?

## 2024-03-11 NOTE — ED Triage Notes (Signed)
 Pt feels her BS is elevated-states she has not checked BS x 2 weeks-also c/o vaginal itching x 1 month-NAD-steady gait

## 2024-03-11 NOTE — ED Provider Notes (Signed)
 [facility]  Note:  This document was prepared using Conservation officer, historic buildings and may include unintentional dictation errors.  MRN: [id number] DOB: [birth year]  Subjective:   Katherine Maldonado is a 38 y.o. female presenting for 2-week history of feeling that her blood sugar is elevated.  Has moments of mental fog, feeling slurred speech.  Has also had vaginal itching and irritation for months.  Patient was recently started on metformin  due to having uncontrolled diabetes.  She has had difficulty making dietary modifications.  Does not have a PCP as she has no insurance.  No active confusion, vision change, chest pain, abdominal pain, urinary symptoms, hematuria.  Has an occasional alcohol use.  No marijuana use.  No smoking.  No drug use.  No current facility-administered medications for this encounter.  Current Outpatient Medications:    albuterol  (VENTOLIN  HFA) 108 (90 Base) MCG/ACT inhaler, Inhale 2 puffs into the lungs every 6 (six) hours as needed for wheezing., Disp: 18 g, Rfl: 0   cefdinir  (OMNICEF ) 300 MG capsule, Take 1 capsule (300 mg total) by mouth 2 (two) times daily., Disp: 20 capsule, Rfl: 0   cetirizine  (ZYRTEC  ALLERGY) 10 MG tablet, Take 1 tablet (10 mg total) by mouth daily., Disp: 30 tablet, Rfl: 0   cyclobenzaprine  (FLEXERIL ) 10 MG tablet, Take 1 tablet (10 mg total) by mouth 3 (three) times daily as needed for muscle spasms., Disp: 20 tablet, Rfl: 0   Insulin  Pen Needle (PEN NEEDLES) 32G X 4 MM MISC, USE ONE NEW NEEDLE PER WEEK WITH INJECTION OF OZEMPIC , Disp: 90 each, Rfl: 11   metFORMIN  (GLUCOPHAGE ) 1000 MG tablet, Take 1 tablet (1,000 mg total) by mouth 2 (two) times daily with a meal., Disp: 180 tablet, Rfl: 0   naproxen  (NAPROSYN ) 375 MG tablet, Take 1 tablet (375 mg total) by mouth 2 (two) times daily., Disp: 20 tablet, Rfl: 0   ondansetron  (ZOFRAN -ODT) 8 MG disintegrating tablet, Take 1 tablet (8 mg total) by mouth every 8 (eight)  hours as needed for nausea., Disp: 30 tablet, Rfl: 3   oxyCODONE -acetaminophen  (PERCOCET) 5-325 MG tablet, Take 1 tablet by mouth every 6 (six) hours as needed for severe pain or moderate pain., Disp: 12 tablet, Rfl: 0   pantoprazole  (PROTONIX ) 40 MG tablet, Take 1 tablet (40 mg total) by mouth daily., Disp: 90 tablet, Rfl: 0   Prenatal 27-1 MG TABS, Take 1 tablet by mouth daily., Disp: 30 tablet, Rfl: 11   promethazine  (PHENERGAN ) 25 MG tablet, Take 1/2 tab (12.5mg ) to 1 tab (25mg ) every 6 hours as needed for vomiting not controlled with zofran ., Disp: 30 tablet, Rfl: 2   promethazine -dextromethorphan (PROMETHAZINE -DM) 6.25-15 MG/5ML syrup, Take 5 mLs by mouth 3 (three) times daily as needed for cough., Disp: 200 mL, Rfl: 0   pseudoephedrine  (SUDAFED) 30 MG tablet, Take 1 tablet (30 mg total) by mouth every 8 (eight) hours as needed for congestion., Disp: 30 tablet, Rfl: 0   rizatriptan  (MAXALT ) 10 MG tablet, Take 1 tablet (10 mg total) by mouth as needed for migraine. May repeat in 2 hours if needed for ongoing pain. Do not take more than 2 doses in a 24 hour period., Disp: 10 tablet, Rfl: 0   Allergies  Allergen Reactions   Latex Swelling and Rash   Penicillins Other (See Comments) and Rash    Did it involve swelling of the face/tongue/throat, SOB, or low BP? Unknown  Did it involve sudden or severe rash/hives, skin peeling,  or any reaction on the inside of your mouth or nose? Unknown  Did you need to seek medical attention at a hospital or doctor's office? Unknown  When did it last happen?  childhood      If all above answers are NO, may proceed with cephalosporin use.  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--partner  declined testing   Depression    Encounter for induction of labor 02/13/2021   Encounter for postoperative care 05/06/2021   Encounter for postpartum visit 03/04/2021   Flank pain 05/06/2021   GBS (group B Streptococcus carrier), +RV culture, currently pregnant 02/06/2021   Headache in pregnancy, antepartum, second trimester 09/12/2020   History of gestational diabetes    none since 02-14-2021 childbirth   History of pre-eclampsia    none since 02-14-2021 childbirth   Migraines    Numbness 04/17/2021   and burning of left thigh at times   Obesity in pregnancy 12/21/2020   Preeclampsia, severe, third trimester 01/30/2021   Preoperative exam for gynecologic surgery 04/22/2021   Unwanted fertility 12/06/2020   btl papers 12/06/20    Wears contact lenses      Past Surgical History:  Procedure Laterality Date   LAPAROSCOPIC BILATERAL SALPINGECTOMY Bilateral 04/24/2021   Procedure: LAPAROSCOPIC BILATERAL SALPINGECTOMY;  Surgeon: Zina Jerilynn LABOR, MD;  Location: [facility];  Service: Gynecology;  Laterality: Bilateral;   TOOTH EXTRACTION     last done 2020    Family History  Adopted: Yes  Problem Relation Age of Onset   Thyroid disease Mother    COPD Mother    Diabetes Father    Hypertension Father    Thrombocytopenia Father     Social History   Tobacco Use   Smoking status: Former    Types: Cigarettes   Smokeless tobacco: Never   Tobacco comments:    when teenager smoked cigarettes, vaping with flavoring in 2021 until found out pregnant  Vaping Use   Vaping status: Former  Substance Use Topics   Alcohol use: Yes    Comment: occasionally   Drug use: Not Currently    Types: Marijuana    Comment: marijuana last used October 2021    ROS   Objective:   Vitals: There were no vitals taken for this visit.  Physical Exam Constitutional:      General: She is not in acute distress.    Appearance: Normal appearance. She is well-developed. She is not  ill-appearing, toxic-appearing or diaphoretic.  HENT:     Head: Normocephalic and atraumatic.     Nose: Nose normal.     Mouth/Throat:     Mouth: Mucous membranes are moist.  Eyes:     General: No scleral icterus.       Right eye: No discharge.        Left eye: No discharge.     Extraocular Movements: Extraocular movements intact.     Conjunctiva/sclera: Conjunctivae normal.  Cardiovascular:     Rate and Rhythm: Normal rate and regular rhythm.     Heart sounds: Normal heart sounds. No murmur heard.    No friction rub. No gallop.  Pulmonary:     Effort: Pulmonary effort is normal. No respiratory distress.  Breath sounds: No stridor. No wheezing, rhonchi or rales.  Chest:     Chest wall: No tenderness.  Abdominal:     General: Bowel sounds are normal. There is no distension.     Palpations: Abdomen is soft. There is no mass.     Tenderness: There is no abdominal tenderness. There is no right CVA tenderness, left CVA tenderness, guarding or rebound.  Skin:    General: Skin is warm and dry.  Neurological:     General: No focal deficit present.     Mental Status: She is alert and oriented to person, place, and time.     Cranial Nerves: No cranial nerve deficit.     Motor: No weakness.     Coordination: Coordination normal.     Gait: Gait normal.  Psychiatric:        Mood and Affect: Mood normal.        Behavior: Behavior normal.        Thought Content: Thought content normal.        Judgment: Judgment normal.     Results for orders placed or performed during the hospital encounter of 03/11/24 (from the past 24 hours)  POCT CBG (manual entry)     Status: Abnormal   Collection Time: 03/11/24  3:52 PM  Result Value Ref Range   POCT Glucose ([facility]) 174 (A) 70 - 99 mg/dL    Assessment and Plan :   PDMP not reviewed this encounter.  1. Acute vaginitis   2. Type 2 diabetes mellitus treated without insulin  (HCC)    Will cover for yeast vaginitis with fluconazole .  Labs  pending.  Recommended continued use of metformin , add glipizide .  Encouraged patient to practice diabetic friendly diet.  Also advised that she reach out to [facility] to try and establish care.  Counseled patient on potential for adverse effects with medications prescribed/recommended today, ER and return-to-clinic precautions discussed, patient verbalized understanding.    Christopher Savannah, [place] 03/11/24 [phone]

## 2024-03-15 LAB — CERVICOVAGINAL ANCILLARY ONLY
Bacterial Vaginitis (gardnerella): NEGATIVE
Candida Glabrata: NEGATIVE
Candida Vaginitis: POSITIVE — AB
Chlamydia: NEGATIVE
Comment: NEGATIVE
Comment: NEGATIVE
Comment: NEGATIVE
Comment: NEGATIVE
Comment: NEGATIVE
Comment: NORMAL
Neisseria Gonorrhea: NEGATIVE
Trichomonas: NEGATIVE

## 2024-03-16 ENCOUNTER — Ambulatory Visit (HOSPITAL_COMMUNITY): Payer: Self-pay

## 2024-06-29 ENCOUNTER — Telehealth: Payer: Self-pay

## 2024-06-29 ENCOUNTER — Encounter: Payer: Self-pay | Admitting: Physician Assistant

## 2024-06-29 DIAGNOSIS — J069 Acute upper respiratory infection, unspecified: Secondary | ICD-10-CM

## 2024-06-29 MED ORDER — PROMETHAZINE-DM 6.25-15 MG/5ML PO SYRP: 5.0000 mL | ORAL_SOLUTION | Freq: Four times a day (QID) | ORAL | 0 refills | Status: DC | PRN

## 2024-06-29 MED ORDER — OSELTAMIVIR PHOSPHATE 75 MG PO CAPS: 75.0000 mg | ORAL_CAPSULE | Freq: Two times a day (BID) | ORAL | 0 refills | Status: DC

## 2024-06-29 MED ORDER — LIDOCAINE VISCOUS HCL 2 % MT SOLN: 5.0000 mL | Freq: Four times a day (QID) | OROMUCOSAL | 0 refills | Status: AC | PRN

## 2024-06-29 NOTE — Progress Notes (Signed)
 Virtual Visit Consent   Katherine Maldonado, you are scheduled for a virtual visit with a [facility] provider today. Just as with appointments in the office, your consent must be obtained to participate. Your consent will be active for this visit and any virtual visit you may have with one of our providers in the next 365 days. If you have a MyChart account, a copy of this consent can be sent to you electronically.  As this is a virtual visit, video technology does not allow for your provider to perform a traditional examination. This may limit your provider's ability to fully assess your condition. If your provider identifies any concerns that need to be evaluated in person or the need to arrange testing (such as labs, EKG, etc.), we will make arrangements to do so. Although advances in technology are sophisticated, we cannot ensure that it will always work on either your end or our end. If the connection with a video visit is poor, the visit may have to be switched to a telephone visit. With either a video or telephone visit, we are not always able to ensure that we have a secure connection.  By engaging in this virtual visit, you consent to the provision of healthcare and authorize for your insurance to be billed (if applicable) for the services provided during this visit. Depending on your insurance coverage, you may receive a charge related to this service.  I need to obtain your verbal consent now. Are you willing to proceed with your visit today? Katherine Maldonado has provided verbal consent on 06/29/2024 for a virtual visit (video or telephone). Delon CHRISTELLA Dickinson, PA-C  Date: 06/29/2024 8:42 AM   Virtual Visit via Video Note   I, Delon CHRISTELLA Dickinson, connected with  Katherine Maldonado  ([id number], 02/04/1986) on 06/29/2024 at  8:30 AM EST by a video-enabled telemedicine application and verified that I am speaking with the correct person using two identifiers.  Location: Patient: Virtual  Visit Location Patient: Mobile Provider: Virtual Visit Location Provider: Home Office   I discussed the limitations of evaluation and management by telemedicine and the availability of in person appointments. The patient expressed understanding and agreed to proceed.    History of Present Illness: Katherine Maldonado is a 38 y.o. who identifies as a female who was assigned female at birth, and is being seen today for URI symptoms.  HPI: URI  This is a new problem. The current episode started yesterday. The problem has been gradually worsening. Maximum temperature: Subjective fevers. Associated symptoms include congestion, coughing, ear pain, headaches, joint pain, a plugged ear sensation, sinus pain, a sore throat and wheezing. Pertinent negatives include no diarrhea, nausea, rhinorrhea or vomiting. Associated symptoms comments: chills. Treatments tried: Dayquil, nyquil. The treatment provided no relief.     Problems:  Patient Active Problem List   Diagnosis Date Noted   Back spasm 12/19/2021   Spinal column pain 12/19/2021   Type 2 diabetes mellitus with hyperglycemia, without long-term current use of insulin  (HCC) 06/05/2021   Bronchitis 06/05/2021   Chronic migraine without aura without status migrainosus, not intractable 06/05/2021   Gestational diabetes mellitus (GDM) requiring insulin  12/24/2020   Obesity (BMI 30-39.9) 12/21/2020    Allergies: Allergies[1] Medications: Current Medications[2]  Observations/Objective: Patient is well-developed, well-nourished in no acute distress.  Resting comfortably  Head is normocephalic, atraumatic.  No labored breathing.  Speech is clear and coherent with logical content.  Patient is alert and oriented at baseline.  Assessment and Plan: 1. Viral URI with cough (Primary) - promethazine -dextromethorphan (PROMETHAZINE -DM) 6.25-15 MG/5ML syrup; Take 5 mLs by mouth 4 (four) times daily as needed.  Dispense: 118 mL; Refill: 0 - lidocaine   (XYLOCAINE ) 2 % solution; Use as directed 5-10 mLs in the mouth or throat every 6 (six) hours as needed.  Dispense: 100 mL; Refill: 0 - oseltamivir  (TAMIFLU ) 75 MG capsule; Take 1 capsule (75 mg total) by mouth 2 (two) times daily.  Dispense: 10 capsule; Refill: 0  - Suspect viral URI; advised to take at home Covid +Flu test - Symptomatic medications of choice over the counter as needed - Add Promethazine  DM for cough and congestion - Advised to start at home Flonase - Added Viscous Lidocaine  for sore throat - Tamilfu sent at patient request, but advised if testing negative for Influenza to not start as it will not be effective - Push fluids - Rest - Work note provided - Seek further evaluation if symptoms change or worsen   Follow Up Instructions: I discussed the assessment and treatment plan with the patient. The patient was provided an opportunity to ask questions and all were answered. The patient agreed with the plan and demonstrated an understanding of the instructions.  A copy of instructions were sent to the patient via MyChart unless otherwise noted below.    The patient was advised to call back or seek an in-person evaluation if the symptoms worsen or if the condition fails to improve as anticipated.    Delon CHRISTELLA Dickinson, PA-C     [1]  Allergies Allergen Reactions   Latex Swelling and Rash   Penicillins Other (See Comments) and Rash    Did it involve swelling of the face/tongue/throat, SOB, or low BP? Unknown  Did it involve sudden or severe rash/hives, skin peeling, or any reaction on the inside of your mouth or nose? Unknown  Did you need to seek medical attention at a hospital or doctor's office? Unknown  When did it last happen?  childhood      If all above answers are NO, may proceed with cephalosporin use.  Childhood reaction  Product containing penicillin (product)  [2]  Current Outpatient Medications:    lidocaine  (XYLOCAINE ) 2 % solution, Use as  directed 5-10 mLs in the mouth or throat every 6 (six) hours as needed., Disp: 100 mL, Rfl: 0   oseltamivir  (TAMIFLU ) 75 MG capsule, Take 1 capsule (75 mg total) by mouth 2 (two) times daily., Disp: 10 capsule, Rfl: 0   promethazine -dextromethorphan (PROMETHAZINE -DM) 6.25-15 MG/5ML syrup, Take 5 mLs by mouth 4 (four) times daily as needed., Disp: 118 mL, Rfl: 0   albuterol  (VENTOLIN  HFA) 108 (90 Base) MCG/ACT inhaler, Inhale 2 puffs into the lungs every 6 (six) hours as needed for wheezing., Disp: 18 g, Rfl: 0   cefdinir  (OMNICEF ) 300 MG capsule, Take 1 capsule (300 mg total) by mouth 2 (two) times daily., Disp: 20 capsule, Rfl: 0   cetirizine  (ZYRTEC  ALLERGY) 10 MG tablet, Take 1 tablet (10 mg total) by mouth daily., Disp: 30 tablet, Rfl: 0   cyclobenzaprine  (FLEXERIL ) 10 MG tablet, Take 1 tablet (10 mg total) by mouth 3 (three) times daily as needed for muscle spasms., Disp: 20 tablet, Rfl: 0   fluconazole  (DIFLUCAN ) 150 MG tablet, Take 1 tablet (150 mg total) by mouth every 3 (three) days., Disp: 3 tablet, Rfl: 0   glipiZIDE  (GLUCOTROL ) 5 MG tablet, Take 0.5 tablets (2.5 mg total) by mouth daily before breakfast., Disp: 30  tablet, Rfl: 1   Insulin  Pen Needle (PEN NEEDLES) 32G X 4 MM MISC, USE ONE NEW NEEDLE PER WEEK WITH INJECTION OF OZEMPIC , Disp: 90 each, Rfl: 11   metFORMIN  (GLUCOPHAGE ) 1000 MG tablet, Take 1 tablet (1,000 mg total) by mouth 2 (two) times daily with a meal., Disp: 180 tablet, Rfl: 0   naproxen  (NAPROSYN ) 375 MG tablet, Take 1 tablet (375 mg total) by mouth 2 (two) times daily., Disp: 20 tablet, Rfl: 0   ondansetron  (ZOFRAN -ODT) 8 MG disintegrating tablet, Take 1 tablet (8 mg total) by mouth every 8 (eight) hours as needed for nausea., Disp: 30 tablet, Rfl: 3   oxyCODONE -acetaminophen  (PERCOCET) 5-325 MG tablet, Take 1 tablet by mouth every 6 (six) hours as needed for severe pain or moderate pain., Disp: 12 tablet, Rfl: 0   pantoprazole  (PROTONIX ) 40 MG tablet, Take 1 tablet (40  mg total) by mouth daily., Disp: 90 tablet, Rfl: 0   Prenatal 27-1 MG TABS, Take 1 tablet by mouth daily., Disp: 30 tablet, Rfl: 11   promethazine  (PHENERGAN ) 25 MG tablet, Take 1/2 tab (12.5mg ) to 1 tab (25mg ) every 6 hours as needed for vomiting not controlled with zofran ., Disp: 30 tablet, Rfl: 2   pseudoephedrine  (SUDAFED) 30 MG tablet, Take 1 tablet (30 mg total) by mouth every 8 (eight) hours as needed for congestion., Disp: 30 tablet, Rfl: 0   rizatriptan  (MAXALT ) 10 MG tablet, Take 1 tablet (10 mg total) by mouth as needed for migraine. May repeat in 2 hours if needed for ongoing pain. Do not take more than 2 doses in a 24 hour period., Disp: 10 tablet, Rfl: 0

## 2024-06-29 NOTE — Patient Instructions (Signed)
 Katherine Maldonado, thank you for joining Delon CHRISTELLA Dickinson, PA-C for today's virtual visit.  While this provider is not your primary care provider (PCP), if your PCP is located in our provider database this encounter information will be shared with them immediately following your visit.   A [place] MyChart account gives you access to today's visit and all your visits, tests, and labs performed at [facility]  click here if you don't have a [place] MyChart account or go to [email or web address]  Consent: (Patient) Katherine Maldonado provided verbal consent for this virtual visit at the beginning of the encounter.  Current Medications:  Current Outpatient Medications:    lidocaine  (XYLOCAINE ) 2 % solution, Use as directed 5-10 mLs in the mouth or throat every 6 (six) hours as needed., Disp: 100 mL, Rfl: 0   oseltamivir  (TAMIFLU ) 75 MG capsule, Take 1 capsule (75 mg total) by mouth 2 (two) times daily., Disp: 10 capsule, Rfl: 0   promethazine -dextromethorphan (PROMETHAZINE -DM) 6.25-15 MG/5ML syrup, Take 5 mLs by mouth 4 (four) times daily as needed., Disp: 118 mL, Rfl: 0   albuterol  (VENTOLIN  HFA) 108 (90 Base) MCG/ACT inhaler, Inhale 2 puffs into the lungs every 6 (six) hours as needed for wheezing., Disp: 18 g, Rfl: 0   cefdinir  (OMNICEF ) 300 MG capsule, Take 1 capsule (300 mg total) by mouth 2 (two) times daily., Disp: 20 capsule, Rfl: 0   cetirizine  (ZYRTEC  ALLERGY) 10 MG tablet, Take 1 tablet (10 mg total) by mouth daily., Disp: 30 tablet, Rfl: 0   cyclobenzaprine  (FLEXERIL ) 10 MG tablet, Take 1 tablet (10 mg total) by mouth 3 (three) times daily as needed for muscle spasms., Disp: 20 tablet, Rfl: 0   fluconazole  (DIFLUCAN ) 150 MG tablet, Take 1 tablet (150 mg total) by mouth every 3 (three) days., Disp: 3 tablet, Rfl: 0   glipiZIDE  (GLUCOTROL ) 5 MG tablet, Take 0.5 tablets (2.5 mg total) by mouth daily before breakfast., Disp: 30 tablet, Rfl: 1   Insulin  Pen Needle  (PEN NEEDLES) 32G X 4 MM MISC, USE ONE NEW NEEDLE PER WEEK WITH INJECTION OF OZEMPIC , Disp: 90 each, Rfl: 11   metFORMIN  (GLUCOPHAGE ) 1000 MG tablet, Take 1 tablet (1,000 mg total) by mouth 2 (two) times daily with a meal., Disp: 180 tablet, Rfl: 0   naproxen  (NAPROSYN ) 375 MG tablet, Take 1 tablet (375 mg total) by mouth 2 (two) times daily., Disp: 20 tablet, Rfl: 0   ondansetron  (ZOFRAN -ODT) 8 MG disintegrating tablet, Take 1 tablet (8 mg total) by mouth every 8 (eight) hours as needed for nausea., Disp: 30 tablet, Rfl: 3   oxyCODONE -acetaminophen  (PERCOCET) 5-325 MG tablet, Take 1 tablet by mouth every 6 (six) hours as needed for severe pain or moderate pain., Disp: 12 tablet, Rfl: 0   pantoprazole  (PROTONIX ) 40 MG tablet, Take 1 tablet (40 mg total) by mouth daily., Disp: 90 tablet, Rfl: 0   Prenatal 27-1 MG TABS, Take 1 tablet by mouth daily., Disp: 30 tablet, Rfl: 11   promethazine  (PHENERGAN ) 25 MG tablet, Take 1/2 tab (12.5mg ) to 1 tab (25mg ) every 6 hours as needed for vomiting not controlled with zofran ., Disp: 30 tablet, Rfl: 2   pseudoephedrine  (SUDAFED) 30 MG tablet, Take 1 tablet (30 mg total) by mouth every 8 (eight) hours as needed for congestion., Disp: 30 tablet, Rfl: 0   rizatriptan  (MAXALT ) 10 MG tablet, Take 1 tablet (10 mg total) by mouth as needed for migraine. May repeat in 2 hours if needed  for ongoing pain. Do not take more than 2 doses in a 24 hour period., Disp: 10 tablet, Rfl: 0   Medications ordered in this encounter:  Meds ordered this encounter  Medications   promethazine -dextromethorphan (PROMETHAZINE -DM) 6.25-15 MG/5ML syrup    Sig: Take 5 mLs by mouth 4 (four) times daily as needed.    Dispense:  118 mL    Refill:  0    Supervising Provider:   LAMPTEY, PHILIP O [[id number]]   lidocaine  (XYLOCAINE ) 2 % solution    Sig: Use as directed 5-10 mLs in the mouth or throat every 6 (six) hours as needed.    Dispense:  100 mL    Refill:  0    Supervising Provider:    BLAISE ALEENE KIDD [id number]   oseltamivir  (TAMIFLU ) 75 MG capsule    Sig: Take 1 capsule (75 mg total) by mouth 2 (two) times daily.    Dispense:  10 capsule    Refill:  0    Supervising Provider:   BLAISE ALEENE KIDD [[id number]]     *If you need refills on other medications prior to your next appointment, please contact your pharmacy*  Follow-Up: Call back or seek an in-person evaluation if the symptoms worsen or if the condition fails to improve as anticipated.  [facility] [phone]  Other Instructions Viral Respiratory Infection A respiratory infection is an illness that affects part of the respiratory system, such as the lungs, nose, or throat. A respiratory infection that is caused by a virus is called a viral respiratory infection. Common types of viral respiratory infections include: A cold. The flu (influenza). A respiratory syncytial virus (RSV) infection. What are the causes? This condition is caused by a virus. The virus may spread through contact with droplets or direct contact with infected people or their mucus or secretions. The virus may spread from person to person (is contagious). What are the signs or symptoms? Symptoms of this condition include: A stuffy or runny nose. A sore throat or cough. Shortness of breath or difficulty breathing. Yellow or green mucus (sputum). Other symptoms may include: A fever. Sweating or chills. Fatigue. Achy muscles. A headache. How is this diagnosed? This condition may be diagnosed based on: Your symptoms. A physical exam. Testing of secretions from the nose or throat. Chest X-ray. How is this treated? This condition may be treated with medicines, such as: Antiviral medicine. This may shorten the length of time a person has symptoms. Expectorants. These make it easier to cough up mucus. Decongestant nasal sprays. Acetaminophen  or NSAIDs, such as ibuprofen , to relieve fever and pain. Antibiotic medicines  are not prescribed for viral infections.This is because antibiotics are designed to kill bacteria. They do not kill viruses. Follow these instructions at home: Managing pain and congestion Take over-the-counter and prescription medicines only as told by your health care provider. If you have a sore throat, gargle with a mixture of salt and water 3-4 times a day or as needed. To make salt water, completely dissolve -1 tsp (3-6 g) of salt in 1 cup (237 mL) of warm water. Use nose drops made from salt water to ease congestion and soften raw skin around your nose. Take 2 tsp (10 mL) of honey at bedtime to lessen coughing at night. Do not give honey to children who are younger than 1 year. Drink enough fluid to keep your urine pale yellow. This helps prevent dehydration and helps loosen up mucus. General instructions  Rest as much  as possible. Do not drink alcohol. Do not use any products that contain nicotine or tobacco. These products include cigarettes, chewing tobacco, and vaping devices, such as e-cigarettes. If you need help quitting, ask your health care provider. Keep all follow-up visits. This is important. How is this prevented?     Get an annual flu shot. You may get the flu shot in late summer, fall, or winter. Ask your health care provider when you should get your flu shot. Avoid spreading your infection to other people. If you are sick: Wash your hands with soap and water often, especially after you cough or sneeze. Wash for at least 20 seconds. If soap and water are not available, use alcohol-based hand sanitizer. Cover your mouth when you cough. Cover your nose and mouth when you sneeze. Do not share cups or eating utensils. Clean commonly used objects often. Clean commonly touched surfaces. Stay home from work or school as told by your health care provider. Avoid contact with people who are sick during cold and flu season. This is generally fall and winter. Contact a health  care provider if: Your symptoms last for 10 days or longer. Your symptoms get worse over time. You have severe sinus pain in your face or forehead. The glands in your jaw or neck become very swollen. You have shortness of breath. Get help right away if you: Feel pain or pressure in your chest. Have trouble breathing. Faint or feel like you will faint. Have severe and persistent vomiting. Feel confused or disoriented. These symptoms may represent a serious problem that is an emergency. Do not wait to see if the symptoms will go away. Get medical help right away. Call your local emergency services (911 in the [place] Do not drive yourself to the hospital. Summary A respiratory infection is an illness that affects part of the respiratory system, such as the lungs, nose, or throat. A respiratory infection that is caused by a virus is called a viral respiratory infection. Common types of viral respiratory infections include a cold, influenza, and respiratory syncytial virus (RSV) infection. Symptoms of this condition include a stuffy or runny nose, cough, fatigue, achy muscles, sore throat, and fevers or chills. Antibiotic medicines are not prescribed for viral infections. This is because antibiotics are designed to kill bacteria. They are not effective against viruses. This information is not intended to replace advice given to you by your health care provider. Make sure you discuss any questions you have with your health care provider. Document Revised: 10/04/2020 Document Reviewed: 10/04/2020 Elsevier Patient Education  2024 Elsevier Inc.   If you have been instructed to have an in-person evaluation today at a local Urgent Care facility, please use the link below. It will take you to a list of all of our available [facility], including address, phone number and hours of operation. Please do not delay care.  [facility]  If you or a family member do not have a  primary care provider, use the link below to schedule a visit and establish care. When you choose a [facility] primary care physician or advanced practice provider, you gain a long-term partner in health. Find a Primary Care Provider  Learn more about [facility]'s in-office and virtual care options:  - Get Care Now

## 2024-07-01 ENCOUNTER — Telehealth: Payer: Self-pay

## 2024-07-01 DIAGNOSIS — J069 Acute upper respiratory infection, unspecified: Secondary | ICD-10-CM

## 2024-07-01 DIAGNOSIS — J040 Acute laryngitis: Secondary | ICD-10-CM

## 2024-07-01 MED ORDER — PREDNISONE 20 MG PO TABS: 40.0000 mg | ORAL_TABLET | Freq: Every day | ORAL | 0 refills | Status: AC

## 2024-07-01 MED ORDER — ONDANSETRON 4 MG PO TBDP: 4.0000 mg | ORAL_TABLET | Freq: Three times a day (TID) | ORAL | 0 refills | Status: AC | PRN

## 2024-07-01 NOTE — Patient Instructions (Signed)
 " Katherine Maldonado, thank you for joining Delon CHRISTELLA Dickinson, PA-C for today's virtual visit.  While this provider is not your primary care provider (PCP), if your PCP is located in our provider database this encounter information will be shared with them immediately following your visit.   A [place] MyChart account gives you access to today's visit and all your visits, tests, and labs performed at [facility]  click here if you don't have a [place] MyChart account or go to [email or web address]  Consent: (Patient) Katherine Maldonado provided verbal consent for this virtual visit at the beginning of the encounter.  Current Medications:  Current Outpatient Medications:    ondansetron  (ZOFRAN -ODT) 4 MG disintegrating tablet, Take 1 tablet (4 mg total) by mouth every 8 (eight) hours as needed., Disp: 20 tablet, Rfl: 0   predniSONE  (DELTASONE ) 20 MG tablet, Take 2 tablets (40 mg total) by mouth daily with breakfast., Disp: 10 tablet, Rfl: 0   albuterol  (VENTOLIN  HFA) 108 (90 Base) MCG/ACT inhaler, Inhale 2 puffs into the lungs every 6 (six) hours as needed for wheezing., Disp: 18 g, Rfl: 0   cefdinir  (OMNICEF ) 300 MG capsule, Take 1 capsule (300 mg total) by mouth 2 (two) times daily., Disp: 20 capsule, Rfl: 0   cetirizine  (ZYRTEC  ALLERGY) 10 MG tablet, Take 1 tablet (10 mg total) by mouth daily., Disp: 30 tablet, Rfl: 0   cyclobenzaprine  (FLEXERIL ) 10 MG tablet, Take 1 tablet (10 mg total) by mouth 3 (three) times daily as needed for muscle spasms., Disp: 20 tablet, Rfl: 0   fluconazole  (DIFLUCAN ) 150 MG tablet, Take 1 tablet (150 mg total) by mouth every 3 (three) days., Disp: 3 tablet, Rfl: 0   glipiZIDE  (GLUCOTROL ) 5 MG tablet, Take 0.5 tablets (2.5 mg total) by mouth daily before breakfast., Disp: 30 tablet, Rfl: 1   Insulin  Pen Needle (PEN NEEDLES) 32G X 4 MM MISC, USE ONE NEW NEEDLE PER WEEK WITH INJECTION OF OZEMPIC , Disp: 90 each, Rfl: 11   lidocaine  (XYLOCAINE ) 2  % solution, Use as directed 5-10 mLs in the mouth or throat every 6 (six) hours as needed., Disp: 100 mL, Rfl: 0   metFORMIN  (GLUCOPHAGE ) 1000 MG tablet, Take 1 tablet (1,000 mg total) by mouth 2 (two) times daily with a meal., Disp: 180 tablet, Rfl: 0   naproxen  (NAPROSYN ) 375 MG tablet, Take 1 tablet (375 mg total) by mouth 2 (two) times daily., Disp: 20 tablet, Rfl: 0   oseltamivir  (TAMIFLU ) 75 MG capsule, Take 1 capsule (75 mg total) by mouth 2 (two) times daily., Disp: 10 capsule, Rfl: 0   oxyCODONE -acetaminophen  (PERCOCET) 5-325 MG tablet, Take 1 tablet by mouth every 6 (six) hours as needed for severe pain or moderate pain., Disp: 12 tablet, Rfl: 0   pantoprazole  (PROTONIX ) 40 MG tablet, Take 1 tablet (40 mg total) by mouth daily., Disp: 90 tablet, Rfl: 0   Prenatal 27-1 MG TABS, Take 1 tablet by mouth daily., Disp: 30 tablet, Rfl: 11   promethazine  (PHENERGAN ) 25 MG tablet, Take 1/2 tab (12.5mg ) to 1 tab (25mg ) every 6 hours as needed for vomiting not controlled with zofran ., Disp: 30 tablet, Rfl: 2   promethazine -dextromethorphan (PROMETHAZINE -DM) 6.25-15 MG/5ML syrup, Take 5 mLs by mouth 4 (four) times daily as needed., Disp: 118 mL, Rfl: 0   pseudoephedrine  (SUDAFED) 30 MG tablet, Take 1 tablet (30 mg total) by mouth every 8 (eight) hours as needed for congestion., Disp: 30 tablet, Rfl: 0   rizatriptan  (MAXALT )  10 MG tablet, Take 1 tablet (10 mg total) by mouth as needed for migraine. May repeat in 2 hours if needed for ongoing pain. Do not take more than 2 doses in a 24 hour period., Disp: 10 tablet, Rfl: 0   Medications ordered in this encounter:  Meds ordered this encounter  Medications   predniSONE  (DELTASONE ) 20 MG tablet    Sig: Take 2 tablets (40 mg total) by mouth daily with breakfast.    Dispense:  10 tablet    Refill:  0    Supervising Provider:   LAMPTEY, PHILIP O [[id number]]   ondansetron  (ZOFRAN -ODT) 4 MG disintegrating tablet    Sig: Take 1 tablet (4 mg total) by mouth  every 8 (eight) hours as needed.    Dispense:  20 tablet    Refill:  0    Supervising Provider:   LAMPTEY, PHILIP O [[id number]]     *If you need refills on other medications prior to your next appointment, please contact your pharmacy*  Follow-Up: Call back or seek an in-person evaluation if the symptoms worsen or if the condition fails to improve as anticipated.  [facility] [phone]  Other Instructions Respiratory Syncytial Virus Infection, Adult Respiratory syncytial virus (RSV) infection is an infection caused by a common virus. RSV is similar to viruses that cause the common cold and the flu. RSV infection can affect the nose, throat, windpipe, and lungs (respiratory system). When the infection is severe, it can cause: Bronchiolitis. This condition causes inflammation of the air passages in the lungs (bronchioles). Pneumonia. This condition causes inflammation of the air sacs in the lungs. RSV infection spreads from person to person (is contagious) through droplets from coughs and sneezes (respiratory secretions). This condition is rarely serious when it occurs in adults. What are the causes? This condition is caused by contact with RSV. This can happen by: Breathing respiratory secretions from someone who has the infection. Touching something that has been exposed to the virus (is contaminated) and then touching your mouth, nose, or eyes. Coming in close contact with someone who has this infection. This may happen if you: Hug or kiss. Shake or hold hands. Eat or drink using the same dishes or utensils. What increases the risk? The following factors may make you more likely to develop this condition: Being 99 years of age or older. Having certain health conditions, including: A long-term (chronic) lung condition, such as chronic obstructive pulmonary disease (COPD). A weak body defense system (immune system). Down syndrome. Heart disease. Working in a hospital  or other health care facility. Living in a long-term health care facility. RSV infections are most common from the months of November to April, but they can happen any time of year. What are the signs or symptoms? Symptoms of this condition include: Having a runny nose. Coughing. You may have a cough that brings up mucus (productive cough). Sneezing. Having a fever. Wanting to eat less than usual. Making high-pitched whistling sounds when you breathe, most often when you breathe out (wheezing). Having shortness of breath or difficulty breathing. Having fluid build up in the lungs (respiratory distress). How is this diagnosed? This condition is diagnosed based on your medical history and a physical exam. You may have tests, such as: A test of a sample of your respiratory secretions. A chest X-ray to rule out pneumonia. Blood tests or tests of mucus from your lungs (sputum). These tests may be done for older adults. How is this treated? In  most cases, the RSV infection will go away after 1-2 weeks of caring for yourself at home.  Sometimes, RSV infection is severe and can cause bronchiolitis or pneumonia. If you develop one or both of these conditions, you may need to be treated in the hospital. You may be given: Oxygen therapy. Antiviral medicine. Medicines to open your bronchioles (bronchodilators). Follow these instructions at home: Medicines Take over-the-counter and prescription medicines only as told by your health care provider. If you were prescribed an antiviral medicine, take it as told by your health care provider. Do not stop using the antiviral even if you start to feel better. Lifestyle  Eat a healthy diet. Do not drink alcohol. Do not use any products that contain nicotine or tobacco. These products include cigarettes, chewing tobacco, and vaping devices, such as e-cigarettes. If you need help quitting, ask your health care provider. Rest at home until your symptoms go  away. Return to your normal activities as told by your health care provider. Ask your health care provider what activities are safe for you. General instructions  Drink enough fluid to keep your urine pale yellow. Gargle with a mixture of salt and water 3-4 times a day or as needed. To make salt water, completely dissolve -1 tsp (3-6 g) of salt in 1 cup (237 mL) of warm water. Keep all follow-up visits. How is this prevented? To prevent catching and spreading RSV: Wash your hands often with soap and water for at least 20 seconds. If soap and water are not available, use hand sanitizer. Do not touch your face without first cleaning your hands. Stay home if you have symptoms of the common cold or the flu. Cover your nose and mouth when you cough or sneeze. Avoid large groups of people. Keep a safe distance of about 6 feet (1.8 m) from people who are coughing or sneezing. Where to find more information Centers for Disease Control and Prevention: footballexhibition.com.br Contact a health care provider if: Your symptoms get worse or have not changed after 2 weeks. You have: A fever. Hot flashes, sweating, or chills that keep happening. A cough that brings up much more mucus than usual. A cough that brings up blood. You feel: Very tired (lethargic). Confused. Get help right away if: You have increased or severe trouble breathing. You lose consciousness. These symptoms may be an emergency. Get help right away. Call 911. Do not wait to see if the symptoms will go away. Do not drive yourself to the hospital. This information is not intended to replace advice given to you by your health care provider. Make sure you discuss any questions you have with your health care provider. Document Revised: 05/28/2023 Document Reviewed: 07/30/2021 Elsevier Patient Education  [employer].   If you have been instructed to have an in-person evaluation today at a local Urgent Care facility, please use the link  below. It will take you to a list of all of our available [facility], including address, phone number and hours of operation. Please do not delay care.  [facility]  If you or a family member do not have a primary care provider, use the link below to schedule a visit and establish care. When you choose a [facility] primary care physician or advanced practice provider, you gain a long-term partner in health. Find a Primary Care Provider  Learn more about [facility]'s in-office and virtual care options: [facility] - Get Care Now "

## 2024-07-01 NOTE — Progress Notes (Signed)
 " Virtual Visit Consent   Katherine Maldonado, you are scheduled for a virtual visit with a [facility] provider today. Just as with appointments in the office, your consent must be obtained to participate. Your consent will be active for this visit and any virtual visit you may have with one of our providers in the next 365 days. If you have a MyChart account, a copy of this consent can be sent to you electronically.  As this is a virtual visit, video technology does not allow for your provider to perform a traditional examination. This may limit your provider's ability to fully assess your condition. If your provider identifies any concerns that need to be evaluated in person or the need to arrange testing (such as labs, EKG, etc.), we will make arrangements to do so. Although advances in technology are sophisticated, we cannot ensure that it will always work on either your end or our end. If the connection with a video visit is poor, the visit may have to be switched to a telephone visit. With either a video or telephone visit, we are not always able to ensure that we have a secure connection.  By engaging in this virtual visit, you consent to the provision of healthcare and authorize for your insurance to be billed (if applicable) for the services provided during this visit. Depending on your insurance coverage, you may receive a charge related to this service.  I need to obtain your verbal consent now. Are you willing to proceed with your visit today? Katherine Maldonado has provided verbal consent on 07/01/2024 for a virtual visit (video or telephone). Katherine CHRISTELLA Dickinson, PA-C  Date: 07/01/2024 8:13 AM   Virtual Visit via Video Note   I, Katherine Maldonado, connected with  Katherine Maldonado  ([id number], 08/26/1985) on 07/01/2024 at  8:00 AM EST by a video-enabled telemedicine application and verified that I am speaking with the correct person using two identifiers.  Location: Patient: Virtual  Visit Location Patient: Home Provider: Virtual Visit Location Provider: Home Office   I discussed the limitations of evaluation and management by telemedicine and the availability of in person appointments. The patient expressed understanding and agreed to proceed.    History of Present Illness: Katherine Maldonado is a 38 y.o. who identifies as a female who was assigned female at birth, and is being seen today for continued URI symptoms. Seen Virtually on 06/29/24 for same issue. Tested negative for Flu and Covid on an at home test.   Now having continued cough and congestion, loss of voice, chills. If she does not have to talk she is okay, but talking and dry mouth trigger coughing spells that can last for up to 10 min and are vomit-inducing.    Problems:  Patient Active Problem List   Diagnosis Date Noted   Back spasm 12/19/2021   Spinal column pain 12/19/2021   Type 2 diabetes mellitus with hyperglycemia, without long-term current use of insulin  (HCC) 06/05/2021   Bronchitis 06/05/2021   Chronic migraine without aura without status migrainosus, not intractable 06/05/2021   Gestational diabetes mellitus (GDM) requiring insulin  12/24/2020   Obesity (BMI 30-39.9) 12/21/2020    Allergies: Allergies[1] Medications: Current Medications[2]  Observations/Objective: Patient is well-developed, well-nourished in no acute distress.  Resting comfortably at home.  Head is normocephalic, atraumatic.  No labored breathing.  Speech is clear and coherent with logical content.  Patient is alert and oriented at baseline.  Dry cough heard without affecting speech  Assessment and Plan: 1. Viral URI with cough (Primary) - predniSONE  (DELTASONE ) 20 MG tablet; Take 2 tablets (40 mg total) by mouth daily with breakfast.  Dispense: 10 tablet; Refill: 0 - ondansetron  (ZOFRAN -ODT) 4 MG disintegrating tablet; Take 1 tablet (4 mg total) by mouth every 8 (eight) hours as needed.  Dispense: 20 tablet; Refill:  0  2. Laryngitis - predniSONE  (DELTASONE ) 20 MG tablet; Take 2 tablets (40 mg total) by mouth daily with breakfast.  Dispense: 10 tablet; Refill: 0  - Suspect viral URI - Symptomatic medications of choice over the counter as needed - Add Prednisone  and Zofran  - Push fluids - Rest - Seek further evaluation if symptoms change or worsen   Follow Up Instructions: I discussed the assessment and treatment plan with the patient. The patient was provided an opportunity to ask questions and all were answered. The patient agreed with the plan and demonstrated an understanding of the instructions.  A copy of instructions were sent to the patient via MyChart unless otherwise noted below.    The patient was advised to call back or seek an in-person evaluation if the symptoms worsen or if the condition fails to improve as anticipated.    Katherine CHRISTELLA Dickinson, PA-C     [1]  Allergies Allergen Reactions   Latex Swelling and Rash   Penicillins Other (See Comments) and Rash    Did it involve swelling of the face/tongue/throat, SOB, or low BP? Unknown  Did it involve sudden or severe rash/hives, skin peeling, or any reaction on the inside of your mouth or nose? Unknown  Did you need to seek medical attention at a hospital or doctor's office? Unknown  When did it last happen?  childhood      If all above answers are NO, may proceed with cephalosporin use.  Childhood reaction  Product containing penicillin (product)  [2]  Current Outpatient Medications:    ondansetron  (ZOFRAN -ODT) 4 MG disintegrating tablet, Take 1 tablet (4 mg total) by mouth every 8 (eight) hours as needed., Disp: 20 tablet, Rfl: 0   predniSONE  (DELTASONE ) 20 MG tablet, Take 2 tablets (40 mg total) by mouth daily with breakfast., Disp: 10 tablet, Rfl: 0   albuterol  (VENTOLIN  HFA) 108 (90 Base) MCG/ACT inhaler, Inhale 2 puffs into the lungs every 6 (six) hours as needed for wheezing., Disp: 18 g, Rfl: 0   cefdinir   (OMNICEF ) 300 MG capsule, Take 1 capsule (300 mg total) by mouth 2 (two) times daily., Disp: 20 capsule, Rfl: 0   cetirizine  (ZYRTEC  ALLERGY) 10 MG tablet, Take 1 tablet (10 mg total) by mouth daily., Disp: 30 tablet, Rfl: 0   cyclobenzaprine  (FLEXERIL ) 10 MG tablet, Take 1 tablet (10 mg total) by mouth 3 (three) times daily as needed for muscle spasms., Disp: 20 tablet, Rfl: 0   fluconazole  (DIFLUCAN ) 150 MG tablet, Take 1 tablet (150 mg total) by mouth every 3 (three) days., Disp: 3 tablet, Rfl: 0   glipiZIDE  (GLUCOTROL ) 5 MG tablet, Take 0.5 tablets (2.5 mg total) by mouth daily before breakfast., Disp: 30 tablet, Rfl: 1   Insulin  Pen Needle (PEN NEEDLES) 32G X 4 MM MISC, USE ONE NEW NEEDLE PER WEEK WITH INJECTION OF OZEMPIC , Disp: 90 each, Rfl: 11   lidocaine  (XYLOCAINE ) 2 % solution, Use as directed 5-10 mLs in the mouth or throat every 6 (six) hours as needed., Disp: 100 mL, Rfl: 0   metFORMIN  (GLUCOPHAGE ) 1000 MG tablet, Take 1 tablet (1,000 mg total) by mouth 2 (two)  times daily with a meal., Disp: 180 tablet, Rfl: 0   naproxen  (NAPROSYN ) 375 MG tablet, Take 1 tablet (375 mg total) by mouth 2 (two) times daily., Disp: 20 tablet, Rfl: 0   oseltamivir  (TAMIFLU ) 75 MG capsule, Take 1 capsule (75 mg total) by mouth 2 (two) times daily., Disp: 10 capsule, Rfl: 0   oxyCODONE -acetaminophen  (PERCOCET) 5-325 MG tablet, Take 1 tablet by mouth every 6 (six) hours as needed for severe pain or moderate pain., Disp: 12 tablet, Rfl: 0   pantoprazole  (PROTONIX ) 40 MG tablet, Take 1 tablet (40 mg total) by mouth daily., Disp: 90 tablet, Rfl: 0   Prenatal 27-1 MG TABS, Take 1 tablet by mouth daily., Disp: 30 tablet, Rfl: 11   promethazine  (PHENERGAN ) 25 MG tablet, Take 1/2 tab (12.5mg ) to 1 tab (25mg ) every 6 hours as needed for vomiting not controlled with zofran ., Disp: 30 tablet, Rfl: 2   promethazine -dextromethorphan (PROMETHAZINE -DM) 6.25-15 MG/5ML syrup, Take 5 mLs by mouth 4 (four) times daily as needed.,  Disp: 118 mL, Rfl: 0   pseudoephedrine  (SUDAFED) 30 MG tablet, Take 1 tablet (30 mg total) by mouth every 8 (eight) hours as needed for congestion., Disp: 30 tablet, Rfl: 0   rizatriptan  (MAXALT ) 10 MG tablet, Take 1 tablet (10 mg total) by mouth as needed for migraine. May repeat in 2 hours if needed for ongoing pain. Do not take more than 2 doses in a 24 hour period., Disp: 10 tablet, Rfl: 0  "

## 2024-07-25 ENCOUNTER — Telehealth: Admitting: Physician Assistant

## 2024-07-25 DIAGNOSIS — H9311 Tinnitus, right ear: Secondary | ICD-10-CM

## 2024-07-25 DIAGNOSIS — H938X1 Other specified disorders of right ear: Secondary | ICD-10-CM | POA: Diagnosis not present

## 2024-07-25 DIAGNOSIS — G479 Sleep disorder, unspecified: Secondary | ICD-10-CM

## 2024-07-25 MED ORDER — AZITHROMYCIN 250 MG PO TABS: ORAL_TABLET | ORAL | 0 refills | Status: AC

## 2024-07-25 MED ORDER — TRAZODONE HCL 50 MG PO TABS: 25.0000 mg | ORAL_TABLET | Freq: Every evening | ORAL | 0 refills | Status: DC | PRN

## 2024-07-25 MED ORDER — FLUTICASONE PROPIONATE 50 MCG/ACT NA SUSP: 2.0000 | Freq: Every day | NASAL | 0 refills | Status: AC

## 2024-07-25 NOTE — Patient Instructions (Addendum)
 " Katherine Maldonado, thank you for joining Delon CHRISTELLA Dickinson, PA-C for today's virtual visit.  While this provider is not your primary care provider (PCP), if your PCP is located in our provider database this encounter information will be shared with them immediately following your visit.   A [facility] MyChart account gives you access to today's visit and all your visits, tests, and labs performed at [facility]  click here if you don't have a [facility] MyChart account or go to [email or web address]  Consent: (Patient) Katherine Maldonado provided verbal consent for this virtual visit at the beginning of the encounter.  Current Medications:  Current Outpatient Medications:    azithromycin  (ZITHROMAX ) 250 MG tablet, Take 2 tablets on day 1, then 1 tablet daily on days 2 through 5, Disp: 6 tablet, Rfl: 0   fluticasone  (FLONASE ) 50 MCG/ACT nasal spray, Place 2 sprays into both nostrils daily., Disp: 16 g, Rfl: 0   traZODone  (DESYREL ) 50 MG tablet, Take 0.5-1 tablets (25-50 mg total) by mouth at bedtime as needed for sleep., Disp: 30 tablet, Rfl: 0   albuterol  (VENTOLIN  HFA) 108 (90 Base) MCG/ACT inhaler, Inhale 2 puffs into the lungs every 6 (six) hours as needed for wheezing., Disp: 18 g, Rfl: 0   cefdinir  (OMNICEF ) 300 MG capsule, Take 1 capsule (300 mg total) by mouth 2 (two) times daily., Disp: 20 capsule, Rfl: 0   cetirizine  (ZYRTEC  ALLERGY) 10 MG tablet, Take 1 tablet (10 mg total) by mouth daily., Disp: 30 tablet, Rfl: 0   cyclobenzaprine  (FLEXERIL ) 10 MG tablet, Take 1 tablet (10 mg total) by mouth 3 (three) times daily as needed for muscle spasms., Disp: 20 tablet, Rfl: 0   fluconazole  (DIFLUCAN ) 150 MG tablet, Take 1 tablet (150 mg total) by mouth every 3 (three) days., Disp: 3 tablet, Rfl: 0   glipiZIDE  (GLUCOTROL ) 5 MG tablet, Take 0.5 tablets (2.5 mg total) by mouth daily before breakfast., Disp: 30 tablet, Rfl: 1   Insulin  Pen Needle (PEN NEEDLES) 32G X 4 MM MISC,  USE ONE NEW NEEDLE PER WEEK WITH INJECTION OF OZEMPIC , Disp: 90 each, Rfl: 11   lidocaine  (XYLOCAINE ) 2 % solution, Use as directed 5-10 mLs in the mouth or throat every 6 (six) hours as needed., Disp: 100 mL, Rfl: 0   metFORMIN  (GLUCOPHAGE ) 1000 MG tablet, Take 1 tablet (1,000 mg total) by mouth 2 (two) times daily with a meal., Disp: 180 tablet, Rfl: 0   naproxen  (NAPROSYN ) 375 MG tablet, Take 1 tablet (375 mg total) by mouth 2 (two) times daily., Disp: 20 tablet, Rfl: 0   ondansetron  (ZOFRAN -ODT) 4 MG disintegrating tablet, Take 1 tablet (4 mg total) by mouth every 8 (eight) hours as needed., Disp: 20 tablet, Rfl: 0   oseltamivir  (TAMIFLU ) 75 MG capsule, Take 1 capsule (75 mg total) by mouth 2 (two) times daily., Disp: 10 capsule, Rfl: 0   oxyCODONE -acetaminophen  (PERCOCET) 5-325 MG tablet, Take 1 tablet by mouth every 6 (six) hours as needed for severe pain or moderate pain., Disp: 12 tablet, Rfl: 0   pantoprazole  (PROTONIX ) 40 MG tablet, Take 1 tablet (40 mg total) by mouth daily., Disp: 90 tablet, Rfl: 0   predniSONE  (DELTASONE ) 20 MG tablet, Take 2 tablets (40 mg total) by mouth daily with breakfast., Disp: 10 tablet, Rfl: 0   Prenatal 27-1 MG TABS, Take 1 tablet by mouth daily., Disp: 30 tablet, Rfl: 11   promethazine  (PHENERGAN ) 25 MG tablet, Take 1/2 tab (12.5mg ) to 1  tab (25mg ) every 6 hours as needed for vomiting not controlled with zofran ., Disp: 30 tablet, Rfl: 2   promethazine -dextromethorphan (PROMETHAZINE -DM) 6.25-15 MG/5ML syrup, Take 5 mLs by mouth 4 (four) times daily as needed., Disp: 118 mL, Rfl: 0   pseudoephedrine  (SUDAFED) 30 MG tablet, Take 1 tablet (30 mg total) by mouth every 8 (eight) hours as needed for congestion., Disp: 30 tablet, Rfl: 0   rizatriptan  (MAXALT ) 10 MG tablet, Take 1 tablet (10 mg total) by mouth as needed for migraine. May repeat in 2 hours if needed for ongoing pain. Do not take more than 2 doses in a 24 hour period., Disp: 10 tablet, Rfl: 0    Medications ordered in this encounter:  Meds ordered this encounter  Medications   azithromycin  (ZITHROMAX ) 250 MG tablet    Sig: Take 2 tablets on day 1, then 1 tablet daily on days 2 through 5    Dispense:  6 tablet    Refill:  0    Supervising Provider:   LAMPTEY, PHILIP O [[id number]]   fluticasone  (FLONASE ) 50 MCG/ACT nasal spray    Sig: Place 2 sprays into both nostrils daily.    Dispense:  16 g    Refill:  0    Supervising Provider:   BLAISE ALEENE KIDD [[id number]]   traZODone  (DESYREL ) 50 MG tablet    Sig: Take 0.5-1 tablets (25-50 mg total) by mouth at bedtime as needed for sleep.    Dispense:  30 tablet    Refill:  0    Supervising Provider:   LAMPTEY, PHILIP O [[id number]]     *If you need refills on other medications prior to your next appointment, please contact your pharmacy*  Follow-Up: Call back or seek an in-person evaluation if the symptoms worsen or if the condition fails to improve as anticipated.  [facility] [phone]  Other Instructions Eustachian Tube Dysfunction  Eustachian tube dysfunction refers to a condition in which a blockage develops in the narrow passage that connects the middle ear to the back of the nose (eustachian tube). The eustachian tube regulates air pressure in the middle ear by letting air move between the ear and nose. It also helps to drain fluid from the middle ear space. Eustachian tube dysfunction can affect one or both ears. When the eustachian tube does not function properly, air pressure, fluid, or both can build up in the middle ear. What are the causes? This condition occurs when the eustachian tube becomes blocked or cannot open normally. Common causes of this condition include: Ear infections. Colds and other infections that affect the nose, mouth, and throat (upper respiratory tract). Allergies. Irritation from cigarette smoke. Irritation from stomach acid coming up into the esophagus (gastroesophageal reflux).  The esophagus is the part of the body that moves food from the mouth to the stomach. Sudden changes in air pressure, such as from descending in an airplane or scuba diving. Abnormal growths in the nose or throat, such as: Growths that line the nose (nasal polyps). Abnormal growth of cells (tumors). Enlarged tissue at the back of the throat (adenoids). What increases the risk? You are more likely to develop this condition if: You smoke. You are overweight. You are a child who has: Certain birth defects of the mouth, such as cleft palate. Large tonsils or adenoids. What are the signs or symptoms? Common symptoms of this condition include: A feeling of fullness in the ear. Ear pain. Clicking or popping noises in the ear.  Ringing in the ear (tinnitus). Hearing loss. Loss of balance. Dizziness. Symptoms may get worse when the air pressure around you changes, such as when you travel to an area of high elevation, fly on an airplane, or go scuba diving. How is this diagnosed? This condition may be diagnosed based on: Your symptoms. A physical exam of your ears, nose, and throat. Tests, such as those that measure: The movement of your eardrum. Your hearing (audiometry). How is this treated? Treatment depends on the cause and severity of your condition. In mild cases, you may relieve your symptoms by moving air into your ears. This is called popping the ears. In more severe cases, or if you have symptoms of fluid in your ears, treatment may include: Medicines to relieve congestion (decongestants). Medicines that treat allergies (antihistamines). Nasal sprays or ear drops that contain medicines that reduce swelling (steroids). A procedure to drain the fluid in your eardrum. In this procedure, a small tube may be placed in the eardrum to: Drain the fluid. Restore the air in the middle ear space. A procedure to insert a balloon device through the nose to inflate the opening of the  eustachian tube (balloon dilation). Follow these instructions at home: Lifestyle Do not do any of the following until your health care provider approves: Travel to high altitudes. Fly in airplanes. Work in a estate agent or room. Scuba dive. Do not use any products that contain nicotine or tobacco. These products include cigarettes, chewing tobacco, and vaping devices, such as e-cigarettes. If you need help quitting, ask your health care provider. Keep your ears dry. Wear fitted earplugs during showering and bathing. Dry your ears completely after. General instructions Take over-the-counter and prescription medicines only as told by your health care provider. Use techniques to help pop your ears as recommended by your health care provider. These may include: Chewing gum. Yawning. Frequent, forceful swallowing. Closing your mouth, holding your nose closed, and gently blowing as if you are trying to blow air out of your nose. Keep all follow-up visits. This is important. Contact a health care provider if: Your symptoms do not go away after treatment. Your symptoms come back after treatment. You are unable to pop your ears. You have: A fever. Pain in your ear. Pain in your head or neck. Fluid draining from your ear. Your hearing suddenly changes. You become very dizzy. You lose your balance. Get help right away if: You have a sudden, severe increase in any of your symptoms. Summary Eustachian tube dysfunction refers to a condition in which a blockage develops in the eustachian tube. It can be caused by ear infections, allergies, inhaled irritants, or abnormal growths in the nose or throat. Symptoms may include ear pain or fullness, hearing loss, or ringing in the ears. Mild cases are treated with techniques to unblock the ears, such as yawning or chewing gum. More severe cases are treated with medicines or procedures. This information is not intended to replace advice given to  you by your health care provider. Make sure you discuss any questions you have with your health care provider. Document Revised: 09/10/2020 Document Reviewed: 09/10/2020 Elsevier Patient Education  2024 [employer]   Insomnia Insomnia is a sleep disorder that makes it difficult to fall asleep or stay asleep. Insomnia can cause fatigue, low energy, difficulty concentrating, mood swings, and poor performance at work or school. There are three different ways to classify insomnia: Difficulty falling asleep. Difficulty staying asleep. Waking up too early in the  morning. Any type of insomnia can be long-term (chronic) or short-term (acute). Both are common. Short-term insomnia usually lasts for 3 months or less. Chronic insomnia occurs at least three times a week for longer than 3 months. What are the causes? Insomnia may be caused by another condition, situation, or substance, such as: Having certain mental health conditions, such as anxiety and depression. Using caffeine , alcohol, tobacco, or drugs. Having gastrointestinal conditions, such as gastroesophageal reflux disease (GERD). Having certain medical conditions. These include: Asthma. Alzheimer's disease. Stroke. Chronic pain. An overactive thyroid gland (hyperthyroidism). Other sleep disorders, such as restless legs syndrome and sleep apnea. Menopause. Sometimes, the cause of insomnia may not be known. What increases the risk? Risk factors for insomnia include: Gender. Females are affected more often than males. Age. Insomnia is more common as people get older. Stress and certain medical and mental health conditions. Lack of exercise. Having an irregular work schedule. This may include working night shifts and traveling between different time zones. What are the signs or symptoms? If you have insomnia, the main symptom is having trouble falling asleep or having trouble staying asleep. This may lead to other symptoms, such  as: Feeling tired or having low energy. Feeling nervous about going to sleep. Not feeling rested in the morning. Having trouble concentrating. Feeling irritable, anxious, or depressed. How is this diagnosed? This condition may be diagnosed based on: Your symptoms and medical history. Your health care provider may ask about: Your sleep habits. Any medical conditions you have. Your mental health. A physical exam. How is this treated? Treatment for insomnia depends on the cause. Treatment may focus on treating an underlying condition that is causing the insomnia. Treatment may also include: Medicines to help you sleep. Counseling or therapy. Lifestyle adjustments to help you sleep better. Follow these instructions at home: Eating and drinking  Limit or avoid alcohol, caffeinated beverages, and products that contain nicotine and tobacco, especially close to bedtime. These can disrupt your sleep. Do not eat a large meal or eat spicy foods right before bedtime. This can lead to digestive discomfort that can make it hard for you to sleep. Sleep habits  Keep a sleep diary to help you and your health care provider figure out what could be causing your insomnia. Write down: When you sleep. When you wake up during the night. How well you sleep and how rested you feel the next day. Any side effects of medicines you are taking. What you eat and drink. Make your bedroom a dark, comfortable place where it is easy to fall asleep. Put up shades or blackout curtains to block light from outside. Use a white noise machine to block noise. Keep the temperature cool. Limit screen use before bedtime. This includes: Not watching TV. Not using your smartphone, tablet, or computer. Stick to a routine that includes going to bed and waking up at the same times every day and night. This can help you fall asleep faster. Consider making a quiet activity, such as reading, part of your nighttime routine. Try to  avoid taking naps during the day so that you sleep better at night. Get out of bed if you are still awake after 15 minutes of trying to sleep. Keep the lights down, but try reading or doing a quiet activity. When you feel sleepy, go back to bed. General instructions Take over-the-counter and prescription medicines only as told by your health care provider. Exercise regularly as told by your health care provider. However, avoid  exercising in the hours right before bedtime. Use relaxation techniques to manage stress. Ask your health care provider to suggest some techniques that may work well for you. These may include: Breathing exercises. Routines to release muscle tension. Visualizing peaceful scenes. Make sure that you drive carefully. Do not drive if you feel very sleepy. Keep all follow-up visits. This is important. Contact a health care provider if: You are tired throughout the day. You have trouble in your daily routine due to sleepiness. You continue to have sleep problems, or your sleep problems get worse. Get help right away if: You have thoughts about hurting yourself or someone else. Get help right away if you feel like you may hurt yourself or others, or have thoughts about taking your own life. Go to your nearest emergency room or: Call 911. Call the National Suicide Prevention Lifeline at 909-187-6332 or 988. This is open 24 hours a day. Text the Crisis Text Line at 432 409 1525. Summary Insomnia is a sleep disorder that makes it difficult to fall asleep or stay asleep. Insomnia can be long-term (chronic) or short-term (acute). Treatment for insomnia depends on the cause. Treatment may focus on treating an underlying condition that is causing the insomnia. Keep a sleep diary to help you and your health care provider figure out what could be causing your insomnia. This information is not intended to replace advice given to you by your health care provider. Make sure you discuss any  questions you have with your health care provider. Document Revised: 06/10/2021 Document Reviewed: 06/10/2021 Elsevier Patient Education  2024 [employer].  If you have been instructed to have an in-person evaluation today at a local Urgent Care facility, please use the link below. It will take you to a list of all of our available [facility], including address, phone number and hours of operation. Please do not delay care.  [facility]  If you or a family member do not have a primary care provider, use the link below to schedule a visit and establish care. When you choose a [facility] primary care physician or advanced practice provider, you gain a long-term partner in health. Find a Primary Care Provider  Learn more about [facility]'s in-office and virtual care options: [facility] - Get Care Now "

## 2024-07-25 NOTE — Progress Notes (Signed)
 " Virtual Visit Consent   Katherine Maldonado, you are scheduled for a virtual visit with a [facility] provider today. Just as with appointments in the office, your consent must be obtained to participate. Your consent will be active for this visit and any virtual visit you may have with one of our providers in the next 365 days. If you have a MyChart account, a copy of this consent can be sent to you electronically.  As this is a virtual visit, video technology does not allow for your provider to perform a traditional examination. This may limit your provider's ability to fully assess your condition. If your provider identifies any concerns that need to be evaluated in person or the need to arrange testing (such as labs, EKG, etc.), we will make arrangements to do so. Although advances in technology are sophisticated, we cannot ensure that it will always work on either your end or our end. If the connection with a video visit is poor, the visit may have to be switched to a telephone visit. With either a video or telephone visit, we are not always able to ensure that we have a secure connection.  By engaging in this virtual visit, you consent to the provision of healthcare and authorize for your insurance to be billed (if applicable) for the services provided during this visit. Depending on your insurance coverage, you may receive a charge related to this service.  I need to obtain your verbal consent now. Are you willing to proceed with your visit today? Katherine Maldonado has provided verbal consent on 07/25/2024 for a virtual visit (video or telephone). Delon CHRISTELLA Dickinson, PA-C  Date: 07/25/2024 4:20 PM   Virtual Visit via Video Note   I, Delon CHRISTELLA Dickinson, connected with  Katherine Maldonado  ([id number], Sep 17, 1985) on 07/25/2024 at  3:00 PM EST by a video-enabled telemedicine application and verified that I am speaking with the correct person using two identifiers.  Location: Patient: Virtual  Visit Location Patient: Home Provider: Virtual Visit Location Provider: Home Office   I discussed the limitations of evaluation and management by telemedicine and the availability of in person appointments. The patient expressed understanding and agreed to proceed.    History of Present Illness: Katherine Maldonado is a 39 y.o. who identifies as a female who was assigned female at birth, and is being seen today for continued ear fullness and ringing of the right ear since having a recent URI (seen Virtually Dec 17 and Jul 01, 2024). Does report ear fullness has been present since URI. However, over the last 4 days has developed ringing and decreased hearing. Denies dizziness.   Also, having worsening sleep issues. Reports has been present at least a year, but since December it has been worsening. Since her URI she reports getting no more than 2 hours per day of rest. Sometimes none. She has tried Melatonin, sleep hygiene, sound machines, unisom, all without relief. She does mention when on the Promethazine  DM cough syrup she was able to sleep, but since that is gone she has not been sleeping.   HPI: HPI  Problems:  Patient Active Problem List   Diagnosis Date Noted   Back spasm 12/19/2021   Spinal column pain 12/19/2021   Type 2 diabetes mellitus with hyperglycemia, without long-term current use of insulin  (HCC) 06/05/2021   Bronchitis 06/05/2021   Chronic migraine without aura without status migrainosus, not intractable 06/05/2021   Gestational diabetes mellitus (GDM) requiring insulin  12/24/2020  Obesity (BMI 30-39.9) 12/21/2020    Allergies: Allergies[1] Medications: Current Medications[2]  Observations/Objective: Patient is well-developed, well-nourished in no acute distress.  Resting comfortably at home.  Head is normocephalic, atraumatic.  No labored breathing.  Speech is clear and coherent with logical content.  Patient is alert and oriented at baseline.    Assessment and  Plan: 1. Difficulty sleeping (Primary) - traZODone  (DESYREL ) 50 MG tablet; Take 0.5-1 tablets (25-50 mg total) by mouth at bedtime as needed for sleep.  Dispense: 30 tablet; Refill: 0  - Worsening over a year, but acutely worsened starting in December 2025 and more so over the last 2 weeks - Failed OTC methods - Will trial Trazodone  - Sleep hygiene - Keep scheduled appt with PCP on 08/12/24  2. Tinnitus of right ear - azithromycin  (ZITHROMAX ) 250 MG tablet; Take 2 tablets on day 1, then 1 tablet daily on days 2 through 5  Dispense: 6 tablet; Refill: 0 - fluticasone  (FLONASE ) 50 MCG/ACT nasal spray; Place 2 sprays into both nostrils daily.  Dispense: 16 g; Refill: 0  3. Sensation of fullness in right ear - azithromycin  (ZITHROMAX ) 250 MG tablet; Take 2 tablets on day 1, then 1 tablet daily on days 2 through 5  Dispense: 6 tablet; Refill: 0 - fluticasone  (FLONASE ) 50 MCG/ACT nasal spray; Place 2 sprays into both nostrils daily.  Dispense: 16 g; Refill: 0  - Worsening acutely since URI - Possible secondary infection vs ETD vs Cerumen impaction vs Labyrinthitis - Will treat with Azithromycin  - Add Flonase  and advised to use for at least 14 days - Avoiding oral steroids since having insomnia - Advised if not improving should be seen in person for ear evaluation   Follow Up Instructions: I discussed the assessment and treatment plan with the patient. The patient was provided an opportunity to ask questions and all were answered. The patient agreed with the plan and demonstrated an understanding of the instructions.  A copy of instructions were sent to the patient via MyChart unless otherwise noted below.    The patient was advised to call back or seek an in-person evaluation if the symptoms worsen or if the condition fails to improve as anticipated.    Delon CHRISTELLA Dickinson, PA-C     [1]  Allergies Allergen Reactions   Latex Swelling and Rash   Penicillins Other (See Comments) and  Rash    Did it involve swelling of the face/tongue/throat, SOB, or low BP? Unknown  Did it involve sudden or severe rash/hives, skin peeling, or any reaction on the inside of your mouth or nose? Unknown  Did you need to seek medical attention at a hospital or doctor's office? Unknown  When did it last happen?  childhood      If all above answers are NO, may proceed with cephalosporin use.  Childhood reaction  Product containing penicillin (product)  [2]  Current Outpatient Medications:    azithromycin  (ZITHROMAX ) 250 MG tablet, Take 2 tablets on day 1, then 1 tablet daily on days 2 through 5, Disp: 6 tablet, Rfl: 0   fluticasone  (FLONASE ) 50 MCG/ACT nasal spray, Place 2 sprays into both nostrils daily., Disp: 16 g, Rfl: 0   traZODone  (DESYREL ) 50 MG tablet, Take 0.5-1 tablets (25-50 mg total) by mouth at bedtime as needed for sleep., Disp: 30 tablet, Rfl: 0   albuterol  (VENTOLIN  HFA) 108 (90 Base) MCG/ACT inhaler, Inhale 2 puffs into the lungs every 6 (six) hours as needed for wheezing., Disp: 18 g, Rfl:  0   cefdinir  (OMNICEF ) 300 MG capsule, Take 1 capsule (300 mg total) by mouth 2 (two) times daily., Disp: 20 capsule, Rfl: 0   cetirizine  (ZYRTEC  ALLERGY) 10 MG tablet, Take 1 tablet (10 mg total) by mouth daily., Disp: 30 tablet, Rfl: 0   cyclobenzaprine  (FLEXERIL ) 10 MG tablet, Take 1 tablet (10 mg total) by mouth 3 (three) times daily as needed for muscle spasms., Disp: 20 tablet, Rfl: 0   fluconazole  (DIFLUCAN ) 150 MG tablet, Take 1 tablet (150 mg total) by mouth every 3 (three) days., Disp: 3 tablet, Rfl: 0   glipiZIDE  (GLUCOTROL ) 5 MG tablet, Take 0.5 tablets (2.5 mg total) by mouth daily before breakfast., Disp: 30 tablet, Rfl: 1   Insulin  Pen Needle (PEN NEEDLES) 32G X 4 MM MISC, USE ONE NEW NEEDLE PER WEEK WITH INJECTION OF OZEMPIC , Disp: 90 each, Rfl: 11   lidocaine  (XYLOCAINE ) 2 % solution, Use as directed 5-10 mLs in the mouth or throat every 6 (six) hours as needed., Disp:  100 mL, Rfl: 0   metFORMIN  (GLUCOPHAGE ) 1000 MG tablet, Take 1 tablet (1,000 mg total) by mouth 2 (two) times daily with a meal., Disp: 180 tablet, Rfl: 0   naproxen  (NAPROSYN ) 375 MG tablet, Take 1 tablet (375 mg total) by mouth 2 (two) times daily., Disp: 20 tablet, Rfl: 0   ondansetron  (ZOFRAN -ODT) 4 MG disintegrating tablet, Take 1 tablet (4 mg total) by mouth every 8 (eight) hours as needed., Disp: 20 tablet, Rfl: 0   oseltamivir  (TAMIFLU ) 75 MG capsule, Take 1 capsule (75 mg total) by mouth 2 (two) times daily., Disp: 10 capsule, Rfl: 0   oxyCODONE -acetaminophen  (PERCOCET) 5-325 MG tablet, Take 1 tablet by mouth every 6 (six) hours as needed for severe pain or moderate pain., Disp: 12 tablet, Rfl: 0   pantoprazole  (PROTONIX ) 40 MG tablet, Take 1 tablet (40 mg total) by mouth daily., Disp: 90 tablet, Rfl: 0   predniSONE  (DELTASONE ) 20 MG tablet, Take 2 tablets (40 mg total) by mouth daily with breakfast., Disp: 10 tablet, Rfl: 0   Prenatal 27-1 MG TABS, Take 1 tablet by mouth daily., Disp: 30 tablet, Rfl: 11   promethazine  (PHENERGAN ) 25 MG tablet, Take 1/2 tab (12.5mg ) to 1 tab (25mg ) every 6 hours as needed for vomiting not controlled with zofran ., Disp: 30 tablet, Rfl: 2   promethazine -dextromethorphan (PROMETHAZINE -DM) 6.25-15 MG/5ML syrup, Take 5 mLs by mouth 4 (four) times daily as needed., Disp: 118 mL, Rfl: 0   pseudoephedrine  (SUDAFED) 30 MG tablet, Take 1 tablet (30 mg total) by mouth every 8 (eight) hours as needed for congestion., Disp: 30 tablet, Rfl: 0   rizatriptan  (MAXALT ) 10 MG tablet, Take 1 tablet (10 mg total) by mouth as needed for migraine. May repeat in 2 hours if needed for ongoing pain. Do not take more than 2 doses in a 24 hour period., Disp: 10 tablet, Rfl: 0  "

## 2024-08-12 ENCOUNTER — Encounter: Payer: Self-pay | Admitting: Physician Assistant

## 2024-08-12 ENCOUNTER — Ambulatory Visit: Admitting: Physician Assistant

## 2024-08-12 VITALS — BP 122/86 | HR 101 | Ht 67.0 in | Wt 238.0 lb

## 2024-08-12 DIAGNOSIS — G479 Sleep disorder, unspecified: Secondary | ICD-10-CM | POA: Diagnosis not present

## 2024-08-12 DIAGNOSIS — Z7985 Long-term (current) use of injectable non-insulin antidiabetic drugs: Secondary | ICD-10-CM | POA: Diagnosis not present

## 2024-08-12 DIAGNOSIS — J4520 Mild intermittent asthma, uncomplicated: Secondary | ICD-10-CM | POA: Diagnosis not present

## 2024-08-12 DIAGNOSIS — J40 Bronchitis, not specified as acute or chronic: Secondary | ICD-10-CM

## 2024-08-12 DIAGNOSIS — F411 Generalized anxiety disorder: Secondary | ICD-10-CM | POA: Diagnosis not present

## 2024-08-12 DIAGNOSIS — E66812 Obesity, class 2: Secondary | ICD-10-CM | POA: Diagnosis not present

## 2024-08-12 DIAGNOSIS — G43709 Chronic migraine without aura, not intractable, without status migrainosus: Secondary | ICD-10-CM | POA: Diagnosis not present

## 2024-08-12 DIAGNOSIS — E1165 Type 2 diabetes mellitus with hyperglycemia: Secondary | ICD-10-CM

## 2024-08-12 DIAGNOSIS — Z6835 Body mass index (BMI) 35.0-35.9, adult: Secondary | ICD-10-CM

## 2024-08-12 DIAGNOSIS — K219 Gastro-esophageal reflux disease without esophagitis: Secondary | ICD-10-CM | POA: Insufficient documentation

## 2024-08-12 DIAGNOSIS — J45909 Unspecified asthma, uncomplicated: Secondary | ICD-10-CM | POA: Insufficient documentation

## 2024-08-12 LAB — CBC WITH DIFFERENTIAL/PLATELET
Basophils Absolute: 0 10*3/uL (ref 0.0–0.1)
Basophils Relative: 0.6 % (ref 0.0–3.0)
Eosinophils Absolute: 0.2 10*3/uL (ref 0.0–0.7)
Eosinophils Relative: 1.9 % (ref 0.0–5.0)
HCT: 43.4 % (ref 36.0–46.0)
Hemoglobin: 14.5 g/dL (ref 12.0–15.0)
Lymphocytes Relative: 30.9 % (ref 12.0–46.0)
Lymphs Abs: 2.4 10*3/uL (ref 0.7–4.0)
MCHC: 33.4 g/dL (ref 30.0–36.0)
MCV: 85.2 fl (ref 78.0–100.0)
Monocytes Absolute: 0.4 10*3/uL (ref 0.1–1.0)
Monocytes Relative: 4.8 % (ref 3.0–12.0)
Neutro Abs: 4.8 10*3/uL (ref 1.4–7.7)
Neutrophils Relative %: 61.8 % (ref 43.0–77.0)
Platelets: 343 10*3/uL (ref 150.0–400.0)
RBC: 5.09 Mil/uL (ref 3.87–5.11)
RDW: 14.1 % (ref 11.5–15.5)
WBC: 7.8 10*3/uL (ref 4.0–10.5)

## 2024-08-12 LAB — COMPREHENSIVE METABOLIC PANEL WITH GFR
ALT: 17 U/L (ref 3–35)
AST: 15 U/L (ref 5–37)
Albumin: 4.2 g/dL (ref 3.5–5.2)
Alkaline Phosphatase: 113 U/L (ref 39–117)
BUN: 7 mg/dL (ref 6–23)
CO2: 26 meq/L (ref 19–32)
Calcium: 9.2 mg/dL (ref 8.4–10.5)
Chloride: 99 meq/L (ref 96–112)
Creatinine, Ser: 0.71 mg/dL (ref 0.40–1.20)
GFR: 106.77 mL/min
Glucose, Bld: 294 mg/dL — ABNORMAL HIGH (ref 70–99)
Potassium: 3.7 meq/L (ref 3.5–5.1)
Sodium: 136 meq/L (ref 135–145)
Total Bilirubin: 0.4 mg/dL (ref 0.2–1.2)
Total Protein: 7.1 g/dL (ref 6.0–8.3)

## 2024-08-12 LAB — LIPID PANEL
Cholesterol: 173 mg/dL (ref 28–200)
HDL: 64.2 mg/dL
LDL Cholesterol: 92 mg/dL (ref 10–99)
NonHDL: 109.03
Total CHOL/HDL Ratio: 3
Triglycerides: 83 mg/dL (ref 10.0–149.0)
VLDL: 16.6 mg/dL (ref 0.0–40.0)

## 2024-08-12 LAB — HEMOGLOBIN A1C: Hgb A1c MFr Bld: 10.2 % — ABNORMAL HIGH (ref 4.6–6.5)

## 2024-08-12 LAB — TSH: TSH: 6.95 u[IU]/mL — ABNORMAL HIGH (ref 0.35–5.50)

## 2024-08-12 MED ORDER — ESCITALOPRAM OXALATE 5 MG PO TABS: 5.0000 mg | ORAL_TABLET | Freq: Every day | ORAL | 3 refills | Status: AC

## 2024-08-12 MED ORDER — PANTOPRAZOLE SODIUM 20 MG PO TBEC: 20.0000 mg | DELAYED_RELEASE_TABLET | Freq: Every day | ORAL | 3 refills | Status: AC

## 2024-08-12 MED ORDER — RIZATRIPTAN BENZOATE 10 MG PO TABS: 10.0000 mg | ORAL_TABLET | ORAL | 1 refills | Status: DC | PRN

## 2024-08-12 MED ORDER — RIZATRIPTAN BENZOATE 10 MG PO TABS: 10.0000 mg | ORAL_TABLET | ORAL | 1 refills | Status: AC | PRN

## 2024-08-12 MED ORDER — ALBUTEROL SULFATE HFA 108 (90 BASE) MCG/ACT IN AERS: 2.0000 | INHALATION_SPRAY | Freq: Four times a day (QID) | RESPIRATORY_TRACT | 0 refills | Status: DC | PRN

## 2024-08-12 MED ORDER — TIRZEPATIDE 2.5 MG/0.5ML ~~LOC~~ SOAJ: 2.5000 mg | SUBCUTANEOUS | 1 refills | Status: AC

## 2024-08-12 NOTE — Patient Instructions (Addendum)
" °  VISIT SUMMARY: During your visit, we discussed your chronic migraines, reactive airway disease, type 2 diabetes, weight management, anxiety and depression, sleep issues, and acid reflux. We reviewed your symptoms, previous treatments, and developed a plan to manage each condition.  YOUR PLAN: -CHRONIC MIGRAINE: Chronic migraines are severe headaches that occur frequently and can be triggered by stress and environmental changes. We have prescribed Maxalt  for abortive treatment to help manage your migraines when they occur.   -MILD INTERMITTENT ASTHMA: Asthma is a condition that affects your airways, causing wheezing and shortness of breath. We have prescribed an albuterol  inhaler for use during flare-ups. If your symptoms increase, we may consider long-acting medications.  -TYPE 2 DIABETES MELLITUS WITH HYPERGLYCEMIA: Type 2 diabetes is a condition where your body does not use insulin  properly, leading to high blood sugar levels. We have prescribed Mounjaro  to help manage your diabetes and assist with weight loss.   -CLASS 2 OBESITY: Obesity is a condition characterized by excessive body weight. We have prescribed Mounjaro  to aid in weight loss and encouraged you to make dietary improvements and consider meal prepping to help manage your weight.  -GENERALIZED ANXIETY DISORDER AND DEPRESSION: Anxiety and depression are mental health conditions that can cause significant stress and impact daily life. We have prescribed Lexapro  to help manage your anxiety and depression and encouraged you to find a therapist, preferably one who offers virtual sessions.  -SLEEP DISORDER: Sleep disorders can cause difficulty falling or staying asleep. We have discontinued trazodone  and will focus on addressing your stress and anxiety through therapy and medication to improve your sleep.  -GASTROESOPHAGEAL REFLUX DISEASE: GERD is a condition where stomach acid frequently flows back into the esophagus, causing heartburn.  We have prescribed pantoprazole  20 mg, with the option to increase to 40 mg if needed. Please take it 30 minutes before meals and make dietary modifications to help manage your symptoms.  INSTRUCTIONS: Please find a therapist ASAP to assist with your recent increased stress and anxiety. We can absolutely assist in that effort if desired.  Will want you to come back in 2-3 weeks for annual physical exam. We will then likely need additional follow-ups to monitor your anxiety as well as your diabetes and weight-loss.     Contains text generated by [employer].   "

## 2024-08-12 NOTE — Progress Notes (Signed)
 "  New Patient Office Visit  Subjective    Patient ID: Katherine Maldonado, female    DOB: [birth year]  Age: 39 y.o. MRN: [id number]  CC:  Chief Complaint  Patient presents with   New Patient (Initial Visit)    Patient presents today for a new patient appointment.   Quality Metric Gaps    Foot & eye exam, TDAP, Pneumococcal vaccine,Hep B vaccines, urine microalbumin     Discussed the use of AI scribe software for clinical note transcription with the patient, who gave verbal consent to proceed.  History of Present Illness Katherine Maldonado is a 39 year old female who presents to establish care and discuss medical issues.  She has experienced chronic migraines since childhood, with episodes occurring one to three times a month. The frequency of migraines is influenced by stress levels and environmental changes. Recently, her migraines have increased in frequency due to heightened stress and anxiety. Migraines sometimes localize over her right eye, affecting vision and concentration, and at other times involve her entire head. She previously used Maxalt  for abortive treatment, which provided variable relief depending on the severity of the migraine. She currently lacks abortive medications.  Taking a nap works the best for relief.   She has a history of reactive airway disease since junior high, characterized by wheezing and coughing, particularly during the summer and when exposed to viral infections. She reports experiencing shortness of breath and coughing with physical exertion, and was told by a previous clinician that she has exercise-induced asthma. She does not currently have a rescue inhaler and reports increased frequency of symptoms since relocating in 2021.  She has a history of gestational diabetes, managed with insulin  during pregnancy. Post-pregnancy, she was prescribed Ozempic , which caused gastrointestinal side effects, and metformin , which she poorly tolerated due to stomach  upset. Her last hemoglobin A1c was 8.6 seven months ago. She struggles with dietary management due to her work schedule and reports difficulty controlling her diabetes.  She experiences difficulty sleeping, attributing it to high stress levels and her toddler's sleep patterns. She was recently prescribed trazodone , which did not improve her sleep. She describes feeling overwhelmed due to stress and anxiety, leading to irritability and difficulty managing daily tasks.  She has a history of acid reflux, with weekly episodes of heartburn, particularly after consuming Italian or Mexican food. She previously used pantoprazole , which was effective in managing her symptoms. She avoids eating after 8 PM to prevent nighttime heartburn.    Outpatient Encounter Medications as of 08/12/2024  Medication Sig   cefdinir  (OMNICEF ) 300 MG capsule Take 1 capsule (300 mg total) by mouth 2 (two) times daily.   cetirizine  (ZYRTEC  ALLERGY) 10 MG tablet Take 1 tablet (10 mg total) by mouth daily.   cyclobenzaprine  (FLEXERIL ) 10 MG tablet Take 1 tablet (10 mg total) by mouth 3 (three) times daily as needed for muscle spasms.   escitalopram  (LEXAPRO ) 5 MG tablet Take 1 tablet (5 mg total) by mouth daily.   fluticasone  (FLONASE ) 50 MCG/ACT nasal spray Place 2 sprays into both nostrils daily.   glipiZIDE  (GLUCOTROL ) 5 MG tablet Take 0.5 tablets (2.5 mg total) by mouth daily before breakfast.   lidocaine  (XYLOCAINE ) 2 % solution Use as directed 5-10 mLs in the mouth or throat every 6 (six) hours as needed.   naproxen  (NAPROSYN ) 375 MG tablet Take 1 tablet (375 mg total) by mouth 2 (two) times daily.   ondansetron  (ZOFRAN -ODT) 4 MG disintegrating tablet Take 1 tablet (  4 mg total) by mouth every 8 (eight) hours as needed.   oxyCODONE -acetaminophen  (PERCOCET) 5-325 MG tablet Take 1 tablet by mouth every 6 (six) hours as needed for severe pain or moderate pain.   pantoprazole  (PROTONIX ) 20 MG tablet Take 1 tablet (20 mg total)  by mouth daily.   predniSONE  (DELTASONE ) 20 MG tablet Take 2 tablets (40 mg total) by mouth daily with breakfast.   Prenatal 27-1 MG TABS Take 1 tablet by mouth daily.   promethazine  (PHENERGAN ) 25 MG tablet Take 1/2 tab (12.5mg ) to 1 tab (25mg ) every 6 hours as needed for vomiting not controlled with zofran .   pseudoephedrine  (SUDAFED) 30 MG tablet Take 1 tablet (30 mg total) by mouth every 8 (eight) hours as needed for congestion.   tirzepatide  (MOUNJARO ) 2.5 MG/0.5ML Pen Inject 2.5 mg into the skin once a week.   [DISCONTINUED] albuterol  (VENTOLIN  HFA) 108 (90 Base) MCG/ACT inhaler Inhale 2 puffs into the lungs every 6 (six) hours as needed for wheezing.   [DISCONTINUED] pantoprazole  (PROTONIX ) 40 MG tablet Take 1 tablet (40 mg total) by mouth daily.   [DISCONTINUED] rizatriptan  (MAXALT ) 10 MG tablet Take 1 tablet (10 mg total) by mouth as needed for migraine. May repeat in 2 hours if needed for ongoing pain. Do not take more than 2 doses in a 24 hour period.   [DISCONTINUED] traZODone  (DESYREL ) 50 MG tablet Take 0.5-1 tablets (25-50 mg total) by mouth at bedtime as needed for sleep.   albuterol  (VENTOLIN  HFA) 108 (90 Base) MCG/ACT inhaler Inhale 2 puffs into the lungs every 6 (six) hours as needed for wheezing.   metFORMIN  (GLUCOPHAGE ) 1000 MG tablet Take 1 tablet (1,000 mg total) by mouth 2 (two) times daily with a meal. (Patient not taking: Reported on 08/12/2024)   rizatriptan  (MAXALT ) 10 MG tablet Take 1 tablet (10 mg total) by mouth as needed for migraine. May repeat in 2 hours if needed for ongoing pain. Do not take more than 2 doses in a 24 hour period.   [DISCONTINUED] fluconazole  (DIFLUCAN ) 150 MG tablet Take 1 tablet (150 mg total) by mouth every 3 (three) days.   [DISCONTINUED] Insulin  Pen Needle (PEN NEEDLES) 32G X 4 MM MISC USE ONE NEW NEEDLE PER WEEK WITH INJECTION OF OZEMPIC    [DISCONTINUED] oseltamivir  (TAMIFLU ) 75 MG capsule Take 1 capsule (75 mg total) by mouth 2 (two) times  daily.   [DISCONTINUED] promethazine -dextromethorphan (PROMETHAZINE -DM) 6.25-15 MG/5ML syrup Take 5 mLs by mouth 4 (four) times daily as needed.   [DISCONTINUED] rizatriptan  (MAXALT ) 10 MG tablet Take 1 tablet (10 mg total) by mouth as needed for migraine. May repeat in 2 hours if needed for ongoing pain. Do not take more than 2 doses in a 24 hour period.   No facility-administered encounter medications on file as of 08/12/2024.    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--partner declined testing   Depression    Diabetes (HCC)    Encounter for induction of labor 02/13/2021   Encounter for postoperative care 05/06/2021   Encounter for postpartum visit 03/04/2021   Flank pain 05/06/2021   GBS (group B Streptococcus carrier), +RV culture, currently pregnant 02/06/2021   Headache in pregnancy, antepartum, second trimester 09/12/2020   History of gestational diabetes    none since  02-14-2021 childbirth   History of pre-eclampsia    none since 02-14-2021 childbirth   Migraines    Numbness 04/17/2021   and burning of left thigh at times   Obesity in pregnancy 12/21/2020   Preeclampsia, severe, third trimester 01/30/2021   Preoperative exam for gynecologic surgery 04/22/2021   Unwanted fertility 12/06/2020   btl papers 12/06/20    Wears contact lenses     Past Surgical History:  Procedure Laterality Date   LAPAROSCOPIC BILATERAL SALPINGECTOMY Bilateral 04/24/2021   Procedure: LAPAROSCOPIC BILATERAL SALPINGECTOMY;  Surgeon: Zina Jerilynn LABOR, MD;  Location: [facility];  Service: Gynecology;  Laterality: Bilateral;   TOOTH EXTRACTION     last done 2020    Family History  Adopted: Yes  Problem Relation Age of Onset   Thyroid disease Mother    COPD Mother    Diabetes Father     Hypertension Father    Thrombocytopenia Father     Social History   Socioeconomic History   Marital status: Divorced    Spouse name: Not on file   Number of children: Not on file   Years of education: Not on file   Highest education level: Not on file  Occupational History   Not on file  Tobacco Use   Smoking status: Former    Types: Cigarettes   Smokeless tobacco: Never   Tobacco comments:    when teenager smoked cigarettes, vaping with flavoring in 2021 until found out pregnant  Vaping Use   Vaping status: Former  Substance and Sexual Activity   Alcohol use: Yes    Comment: occasionally   Drug use: Yes    Types: Marijuana   Sexual activity: Not Currently    Birth control/protection: Surgical  Other Topics Concern   Not on file  Social History Narrative   Right handed   Drinks caffeine    One story home   Social Drivers of Health   Tobacco Use: Medium Risk (08/12/2024)   Patient History    Smoking Tobacco Use: Former    Smokeless Tobacco Use: Never    Passive Exposure: Not on Actuary Strain: Not on file  Food Insecurity: Not on file  Transportation Needs: Not on file  Physical Activity: Not on file  Stress: Not on file  Social Connections: Unknown (11/26/2021)   Received from [facility]   Social Network    Social Network: Not on file  Intimate Partner Violence: Unknown (10/18/2021)   Received from [facility]   HITS    Physically Hurt: Not on file    Insult or Talk Down To: Not on file    Threaten Physical Harm: Not on file    Scream or Curse: Not on file  Depression (PHQ2-9): High Risk (08/12/2024)   Depression (PHQ2-9)    PHQ-2 Score: 14  Alcohol Screen: Not on file  Housing: Not on file  Utilities: Not on file  Health Literacy: Not on file    ROS See HPI, all else negative.     Objective    BP 122/86   Pulse (!) 101   Ht 5' 7 (1.702 m)   Wt 238 lb (108 kg)   SpO2 98%   BMI 37.28 kg/m   Physical  Exam Constitutional:      Appearance: Normal appearance.  Eyes:     Extraocular Movements: Extraocular movements intact.  Cardiovascular:     Rate and Rhythm: Normal rate and regular rhythm.     Pulses: Normal pulses.  Heart sounds: Normal heart sounds.  Pulmonary:     Effort: Pulmonary effort is normal.     Breath sounds: Normal breath sounds. No wheezing.  Musculoskeletal:        General: Normal range of motion.     Cervical back: Normal range of motion.  Neurological:     Mental Status: She is alert and oriented to person, place, and time.  Psychiatric:        Behavior: Behavior normal.         Assessment & Plan:   Problem List Items Addressed This Visit       Cardiovascular and Mediastinum   Chronic migraine without aura without status migrainosus, not intractable - Primary   - Previously saw neurology in [place]  - Chronic migraines 1-3 times monthly, exacerbated by stress and environmental changes.  - Previously used Maxalt  with some relief. She is out of any and all abortive medications. - Prescribed Maxalt  10 mg tablets. - Discussed potential future use of prophylactic medications like Topamax.      Relevant Medications   escitalopram  (LEXAPRO ) 5 MG tablet   rizatriptan  (MAXALT ) 10 MG tablet     Respiratory   Reactive airway disease   - Symptoms exacerbated by exercise and seasonal changes. Unsure what happened to the inhaler prescribed last year. Cited recent move. - Prescribed albuterol  inhaler for flare-ups. - Discussed potential use of long-acting medications if episodes increase in frequency.      RESOLVED: Bronchitis   Relevant Medications   albuterol  (VENTOLIN  HFA) 108 (90 Base) MCG/ACT inhaler     Digestive   GERD (gastroesophageal reflux disease)   - GERD with weekly heartburn episodes, exacerbated by certain foods. Previously managed with pantoprazole  with good effect. - No hx of endoscopy, but denies any history of melena or hematemesis  concerning for PUD. - Encouraged dietary modifications. - Prescribed pantoprazole  20 mg, with option to increase to 40 mg. - Advised taking 30 minutes before meals.      Relevant Medications   pantoprazole  (PROTONIX ) 20 MG tablet     Endocrine   Type 2 diabetes mellitus with hyperglycemia, without long-term current use of insulin  (HCC)   - Type 2 diabetes with recent hemoglobin A1c of 8.6.  - Poorly tolerated metformin  and Ozempic . She cited predominantly GI concerns. Discussed with patient how this is typical with most antihyperglyemic agents including the GLP-1 medications that she expressed interest in.  - Will try low-dose Mounjaro  and slowly work-up, as tolerated.       Relevant Medications   tirzepatide  (MOUNJARO ) 2.5 MG/0.5ML Pen   Other Relevant Orders   Hemoglobin A1c   Comprehensive metabolic panel with GFR   CBC with Differential/Platelet     Other   Difficulty sleeping   - Difficulty sleeping due to stress and frequent awakenings by toddler. Previous trazodone  ineffective.  - Discussed addressing stress and anxiety to improve sleep. - Discontinued trazodone  due to reports that it was ineffective.      GAD (generalized anxiety disorder)   - Elevated stress and difficulty sleeping. Adamantly denies any though of self harm or HI. - She tried therapy in the past, but without much improvement. She expressed interest in medical management. - Prescribed Lexapro , but emphasized importance of concurrent therapy as gold standard for care. - She is actively seeking a therapist now that she has new insurance and is hoping to find one that can provide virtual care given her scheduling constraints.      Relevant Medications  escitalopram  (LEXAPRO ) 5 MG tablet   Class 2 obesity due to excess calories with body mass index (BMI) of 35.0 to 35.9 in adult   - Difficulty losing weight due to work schedule and dietary challenges.  - Emphasized the critical importance of lifestyle and  dietary modifications as primary mechanism for improvement, albeit limited due to time constraints.      Relevant Medications   tirzepatide  (MOUNJARO ) 2.5 MG/0.5ML Pen   Other Relevant Orders   Lipid panel   TSH   Vitamin D (25 hydroxy)    Return in about 2 weeks (around 08/26/2024) for Annual Physical Appt.   Honora Seip, PA-C   "

## 2024-08-12 NOTE — Assessment & Plan Note (Addendum)
-   Symptoms exacerbated by exercise and seasonal changes. Unsure what happened to the inhaler prescribed last year. Cited recent move. - Prescribed albuterol  inhaler for flare-ups. - Discussed potential use of long-acting medications if episodes increase in frequency.

## 2024-08-12 NOTE — Assessment & Plan Note (Signed)
-   Difficulty losing weight due to work schedule and dietary challenges.  - Emphasized the critical importance of lifestyle and dietary modifications as primary mechanism for improvement, albeit limited due to time constraints.

## 2024-08-12 NOTE — Assessment & Plan Note (Addendum)
-   Elevated stress and difficulty sleeping. Adamantly denies any though of self harm or HI. - She tried therapy in the past, but without much improvement. She expressed interest in medical management. - Prescribed Lexapro , but emphasized importance of concurrent therapy as gold standard for care. - She is actively seeking a therapist now that she has new insurance and is hoping to find one that can provide virtual care given her scheduling constraints.

## 2024-08-12 NOTE — Assessment & Plan Note (Addendum)
-   Type 2 diabetes with recent hemoglobin A1c of 8.6.  - Poorly tolerated metformin  and Ozempic . She cited predominantly GI concerns. Discussed with patient how this is typical with most antihyperglyemic agents including the GLP-1 medications that she expressed interest in.  - Will try low-dose Mounjaro  and slowly work-up, as tolerated.

## 2024-08-12 NOTE — Assessment & Plan Note (Addendum)
-   Difficulty sleeping due to stress and frequent awakenings by toddler. Previous trazodone  ineffective.  - Discussed addressing stress and anxiety to improve sleep. - Discontinued trazodone  due to reports that it was ineffective.

## 2024-08-15 MED ORDER — EMPAGLIFLOZIN 10 MG PO TABS: 10.0000 mg | ORAL_TABLET | Freq: Every day | ORAL | 3 refills | Status: AC

## 2024-08-15 MED ORDER — LEVALBUTEROL TARTRATE 45 MCG/ACT IN AERO: 1.0000 | INHALATION_SPRAY | Freq: Four times a day (QID) | RESPIRATORY_TRACT | 12 refills | Status: AC | PRN

## 2024-08-16 ENCOUNTER — Ambulatory Visit: Payer: Self-pay | Admitting: Physician Assistant

## 2024-08-16 DIAGNOSIS — E039 Hypothyroidism, unspecified: Secondary | ICD-10-CM

## 2024-08-18 ENCOUNTER — Other Ambulatory Visit

## 2024-08-19 ENCOUNTER — Other Ambulatory Visit

## 2024-08-29 ENCOUNTER — Encounter: Admitting: Physician Assistant
